# Patient Record
Sex: Female | Born: 1985 | Race: White | Hispanic: No | Marital: Married | Smoking: Current every day smoker
Health system: Southern US, Community
[De-identification: ages and names within clinical notes are randomized; demographics above are authoritative.]

## PROBLEM LIST (undated history)

## (undated) ENCOUNTER — Inpatient Hospital Stay (HOSPITAL_COMMUNITY): Payer: Self-pay

## (undated) DIAGNOSIS — R519 Headache, unspecified: Secondary | ICD-10-CM

## (undated) DIAGNOSIS — Z789 Other specified health status: Secondary | ICD-10-CM

## (undated) DIAGNOSIS — Z331 Pregnant state, incidental: Secondary | ICD-10-CM

## (undated) DIAGNOSIS — R51 Headache: Secondary | ICD-10-CM

## (undated) HISTORY — DX: Other specified health status: Z78.9

## (undated) HISTORY — PX: WISDOM TOOTH EXTRACTION: SHX21

## (undated) HISTORY — PX: ORIF FINGER / THUMB FRACTURE: SUR932

---
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
## 2005-06-30 ENCOUNTER — Emergency Department (HOSPITAL_COMMUNITY): Admission: EM | Admit: 2005-06-30 | Discharge: 2005-06-30 | Payer: Self-pay | Admitting: Emergency Medicine

## 2005-12-16 ENCOUNTER — Emergency Department (HOSPITAL_COMMUNITY): Admission: EM | Admit: 2005-12-16 | Discharge: 2005-12-16 | Payer: Self-pay | Admitting: Emergency Medicine

## 2006-02-24 ENCOUNTER — Emergency Department (HOSPITAL_COMMUNITY): Admission: EM | Admit: 2006-02-24 | Discharge: 2006-02-24 | Payer: Self-pay | Admitting: Emergency Medicine

## 2006-03-10 ENCOUNTER — Emergency Department (HOSPITAL_COMMUNITY): Admission: EM | Admit: 2006-03-10 | Discharge: 2006-03-10 | Payer: Self-pay | Admitting: Emergency Medicine

## 2006-04-04 ENCOUNTER — Inpatient Hospital Stay (HOSPITAL_COMMUNITY): Admission: AD | Admit: 2006-04-04 | Discharge: 2006-04-04 | Payer: Self-pay | Admitting: Gynecology

## 2006-06-07 ENCOUNTER — Inpatient Hospital Stay (HOSPITAL_COMMUNITY): Admission: AD | Admit: 2006-06-07 | Discharge: 2006-06-07 | Payer: Self-pay | Admitting: Obstetrics & Gynecology

## 2006-10-03 ENCOUNTER — Inpatient Hospital Stay (HOSPITAL_COMMUNITY): Admission: AD | Admit: 2006-10-03 | Discharge: 2006-10-03 | Payer: Self-pay | Admitting: Obstetrics and Gynecology

## 2006-10-19 ENCOUNTER — Inpatient Hospital Stay (HOSPITAL_COMMUNITY): Admission: AD | Admit: 2006-10-19 | Discharge: 2006-10-19 | Payer: Self-pay | Admitting: Obstetrics and Gynecology

## 2006-11-10 ENCOUNTER — Inpatient Hospital Stay (HOSPITAL_COMMUNITY): Admission: RE | Admit: 2006-11-10 | Discharge: 2006-11-12 | Payer: Self-pay | Admitting: Obstetrics and Gynecology

## 2006-11-17 ENCOUNTER — Inpatient Hospital Stay (HOSPITAL_COMMUNITY): Admission: AD | Admit: 2006-11-17 | Discharge: 2006-11-18 | Payer: Self-pay | Admitting: Obstetrics and Gynecology

## 2006-12-22 ENCOUNTER — Other Ambulatory Visit: Admission: RE | Admit: 2006-12-22 | Discharge: 2006-12-22 | Payer: Self-pay | Admitting: Obstetrics and Gynecology

## 2007-07-16 ENCOUNTER — Emergency Department (HOSPITAL_COMMUNITY): Admission: EM | Admit: 2007-07-16 | Discharge: 2007-07-16 | Payer: Self-pay | Admitting: Emergency Medicine

## 2007-10-08 ENCOUNTER — Emergency Department (HOSPITAL_COMMUNITY): Admission: EM | Admit: 2007-10-08 | Discharge: 2007-10-08 | Payer: Self-pay | Admitting: Emergency Medicine

## 2007-10-29 ENCOUNTER — Emergency Department (HOSPITAL_COMMUNITY): Admission: EM | Admit: 2007-10-29 | Discharge: 2007-10-29 | Payer: Self-pay | Admitting: Emergency Medicine

## 2008-04-27 ENCOUNTER — Inpatient Hospital Stay (HOSPITAL_COMMUNITY): Admission: AD | Admit: 2008-04-27 | Discharge: 2008-04-27 | Payer: Self-pay | Admitting: Obstetrics

## 2008-06-20 ENCOUNTER — Emergency Department (HOSPITAL_COMMUNITY): Admission: EM | Admit: 2008-06-20 | Discharge: 2008-06-20 | Payer: Self-pay | Admitting: Emergency Medicine

## 2008-11-23 ENCOUNTER — Emergency Department (HOSPITAL_COMMUNITY): Admission: EM | Admit: 2008-11-23 | Discharge: 2008-11-23 | Payer: Self-pay | Admitting: Emergency Medicine

## 2009-03-25 ENCOUNTER — Emergency Department (HOSPITAL_COMMUNITY): Admission: EM | Admit: 2009-03-25 | Discharge: 2009-03-25 | Payer: Self-pay | Admitting: Emergency Medicine

## 2010-07-21 ENCOUNTER — Emergency Department (HOSPITAL_COMMUNITY): Admission: EM | Admit: 2010-07-21 | Discharge: 2010-07-21 | Payer: Self-pay | Admitting: Emergency Medicine

## 2010-12-11 LAB — RAPID STREP SCREEN (MED CTR MEBANE ONLY): Streptococcus, Group A Screen (Direct): NEGATIVE

## 2011-01-06 LAB — URINALYSIS, ROUTINE W REFLEX MICROSCOPIC
Bilirubin Urine: NEGATIVE
Glucose, UA: NEGATIVE mg/dL
Hgb urine dipstick: NEGATIVE
Ketones, ur: NEGATIVE mg/dL
Nitrite: NEGATIVE
Protein, ur: NEGATIVE mg/dL
Specific Gravity, Urine: 1.023 (ref 1.005–1.030)
Urobilinogen, UA: 1 mg/dL (ref 0.0–1.0)
pH: 7 (ref 5.0–8.0)

## 2011-01-06 LAB — PREGNANCY, URINE: Preg Test, Ur: NEGATIVE

## 2011-01-14 LAB — RAPID STREP SCREEN (MED CTR MEBANE ONLY): Streptococcus, Group A Screen (Direct): NEGATIVE

## 2011-02-14 NOTE — H&P (Signed)
Brooke Carrillo, STAILEY                ACCOUNT NO.:  [id number]   MEDICAL RECORD NO.:  [id number]           PATIENT TYPE:   LOCATION:                                FACILITY:  [facility]   PHYSICIAN:  James A. Ashley Royalty, M.D.DATE OF BIRTH:  September 19, 1986   DATE OF ADMISSION:  11/10/2006  DATE OF DISCHARGE:                              HISTORY & PHYSICAL   This is a 25 year old gravida 2, para 0, AB1, EDC November 17, 2006, 39  weeks' gestation.  Prenatal care has been complicated by late transfer  to [facility], smoking, Rh negative (received  RhoGAM), asthma.  The patient has been receiving nonstress tests in the  office in deference to her smoking history and thus far they have been  nonreactive.  Cervical examination on November 09, 2006, revealed the  cervix to be 2-cm dilated, 80% effaced, -2 station, vertex presentation.  The patient is admitted for induction secondary to advanced cervical  changes at term and smoking.   MEDICATIONS:  Vitamins.   PAST MEDICAL HISTORY:   MEDICAL:  Negative.   SURGICAL:  Negative.   ALLERGIES:  NONE.   FAMILY HISTORY:  Noncontributory.   SOCIAL HISTORY:  The patient is a smoker.  She denies significant  alcohol use.   REVIEW OF SYSTEMS:  Noncontributory.   PHYSICAL EXAMINATION:  GENERAL:  Well-developed, well-nourished,  pleasant, white female in no acute distress.  VITAL SIGNS:  Afebrile, vital signs stable, weight 203.  CHEST:  Lungs are clear.  CARDIAC:  Regular rate and rhythm.  ABDOMEN:  Gravid with a term fundal height.  Fetal heart tones are  auscultated.  MUSCULOSKELETAL:  No CVA tenderness.  PELVIC:  Please see most recent office evaluation as above.   IMPRESSION:  1. Intrauterine pregnancy at 36 weeks' gestation.  2. Late transfer to [facility].  3. Smoker.  4. Rh negative.  5. Asthma.  6. Advanced cervical changes at term.   PLAN:  1. Admit.  2. Induction of labor.  Risks,  benefits discussed and accepted.      Questions invited and answered.      James A. Ashley Royalty, M.D.  Electronically Signed     JAM/MEDQ  D:  11/09/2006  T:  11/09/2006  Job:  [id number]

## 2011-02-14 NOTE — Discharge Summary (Signed)
Brooke Carrillo, Brooke Carrillo                ACCOUNT NO.:  [id number]   MEDICAL RECORD NO.:  [id number]          PATIENT TYPE:  INP   LOCATION:  9135                          FACILITY:  [facility]   PHYSICIAN:  James A. Ashley Royalty, M.D.DATE OF BIRTH:  [birth year]   DATE OF ADMISSION:  11/10/2006  DATE OF DISCHARGE:  11/12/2006                               DISCHARGE SUMMARY   DISCHARGE DIAGNOSES:  1. Intrauterine pregnancy at 65 weeks' gestation, delivered.  2. Late transfer for obstetrical care.  3. Smoker.  4. Rh negative.  5. Asthma.   OPERATIONS AND SPECIAL PROCEDURES:  OB delivery with episiotomy,  episiorrhaphy and repair of left vaginal side wall laceration.   CONSULTATIONS:  None.   DISCHARGE MEDICATIONS:  1. Motrin 600 mg.  2. Iron.   HISTORY OF PRESENT ILLNESS:  This is a 25 year old gravida 2, para 0, AB  1 at 62 weeks' gestation. Prenatal care was complicated by the  aforementioned diagnosis. The patient was admitted for induction  secondary to advanced cervical changes at term and history of smoking.  For the remainder of history and physical, please see chart.   HOSPITAL COURSE:  The patient was admitted to [facility]  [place]. Admission laboratory studies were drawn. Rupture of  membranes was accomplished which revealed clear fluid. The patient was  given Pitocin. She went on to labor and deliver on November 10, 2006.  The infant was a 7-pound 6-ounce female, Apgars 8 at one minute and 9 at  five minutes, sent to the newborn nursery. Delivery was accomplished by  Dr. Sylvester Harder over a second-degree midline episiotomy. The patient  had a transient blood pressure elevation after smoking on November 11, 2006. However, the blood pressure elevations were not sustained, and  there was no evidence of preeclampsia found. She was felt to be stable  for discharge on November 12, 2006. She was discharged home afebrile and  in satisfactory condition.   DISPOSITION:  The  patient is to return to [facility] and  Obstetrics in 6 weeks for postpartum evaluation.     James A. Ashley Royalty, M.D.  Electronically Signed    JAM/MEDQ  D:  12/30/2006  T:  12/30/2006  Job:  [id number]

## 2011-06-07 ENCOUNTER — Emergency Department (HOSPITAL_COMMUNITY)
Admission: EM | Admit: 2011-06-07 | Discharge: 2011-06-07 | Disposition: A | Discharge status: Home / Self Care | Payer: Medicaid Other | Attending: Emergency Medicine | Admitting: Emergency Medicine

## 2011-06-07 DIAGNOSIS — J029 Acute pharyngitis, unspecified: Secondary | ICD-10-CM | POA: Insufficient documentation

## 2011-06-07 DIAGNOSIS — R6889 Other general symptoms and signs: Secondary | ICD-10-CM | POA: Insufficient documentation

## 2011-06-07 DIAGNOSIS — R05 Cough: Secondary | ICD-10-CM | POA: Insufficient documentation

## 2011-06-07 DIAGNOSIS — R059 Cough, unspecified: Secondary | ICD-10-CM | POA: Insufficient documentation

## 2011-06-07 LAB — RAPID STREP SCREEN (MED CTR MEBANE ONLY): Streptococcus, Group A Screen (Direct): NEGATIVE

## 2011-06-18 LAB — DIFFERENTIAL
Basophils Absolute: 0.1
Basophils Relative: 1
Eosinophils Absolute: 0.3
Eosinophils Relative: 2
Lymphocytes Relative: 22
Lymphs Abs: 3.2
Monocytes Absolute: 0.6
Monocytes Relative: 4
Neutro Abs: 10.5 — ABNORMAL HIGH
Neutrophils Relative %: 71

## 2011-06-18 LAB — COMPREHENSIVE METABOLIC PANEL
ALT: 15
AST: 20
Albumin: 4.6
Alkaline Phosphatase: 70
BUN: 6
CO2: 28
Calcium: 10.2
Chloride: 105
Creatinine, Ser: 0.76
GFR calc Af Amer: >60
GFR calc non Af Amer: >60
Glucose, Bld: 94
Potassium: 4
Sodium: 140
Total Bilirubin: 1.1
Total Protein: 7.7

## 2011-06-18 LAB — CBC
HCT: 42.6
Hemoglobin: 14.8
MCHC: 34.8
MCV: 92.4
Platelets: 350
RBC: 4.61
RDW: 13.5
WBC: 14.8 — ABNORMAL HIGH

## 2011-06-18 LAB — APTT: aPTT: 29

## 2011-06-18 LAB — PROTIME-INR
INR: 1
Prothrombin Time: 13.3

## 2011-06-27 LAB — URINALYSIS, ROUTINE W REFLEX MICROSCOPIC
Bilirubin Urine: NEGATIVE
Glucose, UA: NEGATIVE
Hgb urine dipstick: NEGATIVE
Ketones, ur: NEGATIVE
Nitrite: NEGATIVE
Protein, ur: NEGATIVE
Specific Gravity, Urine: 1.015
Urobilinogen, UA: 2 — ABNORMAL HIGH
pH: 7.5

## 2011-06-27 LAB — HCG, QUANTITATIVE, PREGNANCY: hCG, Beta Chain, Quant, S: 32094 — ABNORMAL HIGH

## 2011-06-30 LAB — RAPID STREP SCREEN (MED CTR MEBANE ONLY): Streptococcus, Group A Screen (Direct): NEGATIVE

## 2011-06-30 LAB — MONONUCLEOSIS SCREEN: Mono Screen: NEGATIVE

## 2011-07-09 LAB — URINALYSIS, ROUTINE W REFLEX MICROSCOPIC
Bilirubin Urine: NEGATIVE
Glucose, UA: NEGATIVE
Hgb urine dipstick: NEGATIVE
Ketones, ur: NEGATIVE
Nitrite: NEGATIVE
Protein, ur: NEGATIVE
Specific Gravity, Urine: 1.02
Urobilinogen, UA: 0.2
pH: 6.5

## 2011-07-09 LAB — PREGNANCY, URINE: Preg Test, Ur: NEGATIVE

## 2011-09-02 LAB — OB RESULTS CONSOLE RPR: RPR: NONREACTIVE

## 2011-09-02 LAB — OB RESULTS CONSOLE HEPATITIS B SURFACE ANTIGEN: Hepatitis B Surface Ag: NEGATIVE

## 2011-09-02 LAB — OB RESULTS CONSOLE HIV ANTIBODY (ROUTINE TESTING): HIV: NONREACTIVE

## 2011-09-02 LAB — OB RESULTS CONSOLE RUBELLA ANTIBODY, IGM: Rubella: IMMUNE

## 2011-09-08 LAB — OB RESULTS CONSOLE GC/CHLAMYDIA
Chlamydia: NEGATIVE
Gonorrhea: NEGATIVE

## 2011-09-30 NOTE — L&D Delivery Note (Signed)
Delivery Note At 12:16 PM a viable female was delivered via Vaginal, Spontaneous Delivery (Presentation: Left Occiput Anterior).  APGAR: 9, 9; weight  3570 gms.   Placenta status: Intact, Spontaneous.  Cord: 3 vessels with the following complications: None.  Cord pH: none  Anesthesia: Epidural  Episiotomy: None Lacerations: None Suture Repair: none Est. Blood Loss (mL): 200  Mom to postpartum.  Baby to nursery-stable.  Jaydan Chretien A 02/24/2012, 12:49 PM

## 2011-10-01 ENCOUNTER — Inpatient Hospital Stay (HOSPITAL_COMMUNITY)
Admission: AD | Admit: 2011-10-01 | Discharge: 2011-10-01 | Disposition: A | Discharge status: Home / Self Care | Payer: Medicaid Other | Source: Ambulatory Visit | Attending: Obstetrics & Gynecology | Admitting: Obstetrics & Gynecology

## 2011-10-01 ENCOUNTER — Encounter (HOSPITAL_COMMUNITY): Payer: Self-pay | Admitting: *Deleted

## 2011-10-01 DIAGNOSIS — R109 Unspecified abdominal pain: Secondary | ICD-10-CM | POA: Insufficient documentation

## 2011-10-01 DIAGNOSIS — M545 Low back pain, unspecified: Secondary | ICD-10-CM | POA: Insufficient documentation

## 2011-10-01 DIAGNOSIS — N949 Unspecified condition associated with female genital organs and menstrual cycle: Secondary | ICD-10-CM

## 2011-10-01 HISTORY — DX: Other specified health status: Z78.9

## 2011-10-01 LAB — URINALYSIS, ROUTINE W REFLEX MICROSCOPIC
Bilirubin Urine: NEGATIVE
Glucose, UA: NEGATIVE mg/dL
Hgb urine dipstick: NEGATIVE
Ketones, ur: NEGATIVE mg/dL
Leukocytes, UA: NEGATIVE
Nitrite: NEGATIVE
Protein, ur: NEGATIVE mg/dL
Specific Gravity, Urine: 1.02 (ref 1.005–1.030)
Urobilinogen, UA: 0.2 mg/dL (ref 0.0–1.0)
pH: 6 (ref 5.0–8.0)

## 2011-10-01 NOTE — Progress Notes (Signed)
FHT's via external monitor.

## 2011-10-01 NOTE — Progress Notes (Signed)
E. Rice, PA at bedside.  Assessment done and poc discussed with pt.   

## 2011-10-01 NOTE — Progress Notes (Signed)
Pt presents to [facility] for c/o lower abdominal pain that started this morning.  Got worse about an hour.  Denies bleeding or leaking.

## 2011-10-01 NOTE — ED Provider Notes (Signed)
History   Pt presents today c/o lower abd and lower back pain for the past couple of days. She states the pains are worse with movement. She denies vag bleeding or any other sx at this time.  Chief Complaint  Patient presents with  . Abdominal Pain   HPI  OB History    Grav Para Term Preterm Abortions TAB SAB Ect Mult Living   1               No past medical history on file.  No past surgical history on file.  No family history on file.  History  Substance Use Topics  . Smoking status: Not on file  . Smokeless tobacco: Not on file  . Alcohol Use: Not on file    Allergies: Allergies not on file  No prescriptions prior to admission    Review of Systems  Constitutional: Negative for fever.  Cardiovascular: Negative for chest pain.  Gastrointestinal: Positive for abdominal pain. Negative for nausea, vomiting, diarrhea and constipation.  Genitourinary: Negative for dysuria, urgency, frequency and hematuria.  Neurological: Negative for dizziness, tingling and headaches.  Psychiatric/Behavioral: Negative for depression and suicidal ideas.   Physical Exam   Blood pressure 124/51, pulse 90, temperature 98.1 F (36.7 C), temperature source Oral, resp. rate 20, height 5\' 2"  (1.575 m), weight 173 lb (78.472 kg), SpO2 98.00%.  Physical Exam  Nursing note and vitals reviewed. Constitutional: She is oriented to person, place, and time. She appears well-developed and well-nourished. No distress.  HENT:  Head: Normocephalic and atraumatic.  Eyes: EOM are normal. Pupils are equal, round, and reactive to light.  GI: Soft. She exhibits no distension. There is no tenderness. There is no rebound and no guarding.  Genitourinary: No bleeding around the vagina. No vaginal discharge found.       Cervix Lg/closed. No dc or bleeding noted on exam.  Neurological: She is alert and oriented to person, place, and time.  Skin: Skin is warm and dry. She is not diaphoretic.  Psychiatric: She  has a normal mood and affect. Her behavior is normal. Judgment and thought content normal.    MAU Course  Procedures  Results for orders placed during the hospital encounter of 10/01/11 (from the past 24 hour(s))  URINALYSIS, ROUTINE W REFLEX MICROSCOPIC     Status: Normal   Collection Time   10/01/11 11:00 PM      Component Value Range   Color, Urine YELLOW  YELLOW    APPearance CLEAR  CLEAR    Specific Gravity, Urine 1.020  1.005 - 1.030    pH 6.0  5.0 - 8.0    Glucose, UA NEGATIVE  NEGATIVE (mg/dL)   Hgb urine dipstick NEGATIVE  NEGATIVE    Bilirubin Urine NEGATIVE  NEGATIVE    Ketones, ur NEGATIVE  NEGATIVE (mg/dL)   Protein, ur NEGATIVE  NEGATIVE (mg/dL)   Urobilinogen, UA 0.2  0.0 - 1.0 (mg/dL)   Nitrite NEGATIVE  NEGATIVE    Leukocytes, UA NEGATIVE  NEGATIVE      Assessment and Plan  Round ligament pain: discussed with pt at length. She has f/u scheduled with Dr. Tamela Oddi. Discussed diet, activity, risks, and precautions.  Clinton Gallant. Girtie Wiersma III, DrHSc, MPAS, PA-C  10/01/2011, 11:12 PM   Henrietta Hoover, PA 10/01/11 2318

## 2011-12-03 ENCOUNTER — Inpatient Hospital Stay (HOSPITAL_COMMUNITY)
Admission: AD | Admit: 2011-12-03 | Discharge: 2011-12-03 | Disposition: A | Discharge status: Home / Self Care | Payer: Medicaid Other | Source: Ambulatory Visit | Attending: Obstetrics & Gynecology | Admitting: Obstetrics & Gynecology

## 2011-12-03 DIAGNOSIS — Z2989 Encounter for other specified prophylactic measures: Secondary | ICD-10-CM | POA: Insufficient documentation

## 2011-12-03 DIAGNOSIS — Z298 Encounter for other specified prophylactic measures: Secondary | ICD-10-CM | POA: Insufficient documentation

## 2011-12-03 MED ORDER — RHO D IMMUNE GLOBULIN 1500 UNIT/2ML IJ SOLN
300.0000 ug | Freq: Once | INTRAMUSCULAR | Status: AC
  Administered 2011-12-03: 300 ug via INTRAMUSCULAR
  Filled 2011-12-03: qty 2

## 2011-12-03 NOTE — Plan of Care (Signed)
Rhophylac information sheet given to the patient and her husband. Explained the process of 1 1/2 hours to process and administer this medication. Patient and her husband understand. Patient will leave after the blood is drawn to take her husband to work and return. Explained that she needs to leave the bracelet on until her return.

## 2011-12-04 LAB — RH IG WORKUP (INCLUDES ABO/RH)
ABO/RH(D): O NEG
Antibody Screen: NEGATIVE
Gestational Age(Wks): 28
Unit division: 0

## 2011-12-18 ENCOUNTER — Encounter (HOSPITAL_COMMUNITY): Payer: Self-pay | Admitting: Emergency Medicine

## 2011-12-18 ENCOUNTER — Emergency Department (HOSPITAL_COMMUNITY)
Admission: EM | Admit: 2011-12-18 | Discharge: 2011-12-18 | Disposition: A | Discharge status: Home / Self Care | Payer: Medicaid Other | Attending: Emergency Medicine | Admitting: Emergency Medicine

## 2011-12-18 ENCOUNTER — Other Ambulatory Visit: Payer: Self-pay

## 2011-12-18 DIAGNOSIS — O9989 Other specified diseases and conditions complicating pregnancy, childbirth and the puerperium: Secondary | ICD-10-CM | POA: Insufficient documentation

## 2011-12-18 DIAGNOSIS — K219 Gastro-esophageal reflux disease without esophagitis: Secondary | ICD-10-CM | POA: Insufficient documentation

## 2011-12-18 DIAGNOSIS — R209 Unspecified disturbances of skin sensation: Secondary | ICD-10-CM | POA: Insufficient documentation

## 2011-12-18 DIAGNOSIS — R110 Nausea: Secondary | ICD-10-CM | POA: Insufficient documentation

## 2011-12-18 DIAGNOSIS — R079 Chest pain, unspecified: Secondary | ICD-10-CM | POA: Insufficient documentation

## 2011-12-18 DIAGNOSIS — R0609 Other forms of dyspnea: Secondary | ICD-10-CM | POA: Insufficient documentation

## 2011-12-18 DIAGNOSIS — R1013 Epigastric pain: Secondary | ICD-10-CM | POA: Insufficient documentation

## 2011-12-18 DIAGNOSIS — R0989 Other specified symptoms and signs involving the circulatory and respiratory systems: Secondary | ICD-10-CM | POA: Insufficient documentation

## 2011-12-18 HISTORY — DX: Pregnant state, incidental: Z33.1

## 2011-12-18 LAB — URINALYSIS, ROUTINE W REFLEX MICROSCOPIC
Glucose, UA: NEGATIVE mg/dL
Hgb urine dipstick: NEGATIVE
Ketones, ur: 15 mg/dL — AB
Nitrite: NEGATIVE
Protein, ur: 30 mg/dL — AB
Specific Gravity, Urine: 1.024 (ref 1.005–1.030)
Urobilinogen, UA: 1 mg/dL (ref 0.0–1.0)
pH: 7 (ref 5.0–8.0)

## 2011-12-18 LAB — COMPREHENSIVE METABOLIC PANEL
ALT: 5 U/L (ref 0–35)
AST: 11 U/L (ref 0–37)
Albumin: 2.6 g/dL — ABNORMAL LOW (ref 3.5–5.2)
Alkaline Phosphatase: 100 U/L (ref 39–117)
BUN: 6 mg/dL (ref 6–23)
CO2: 24 mEq/L (ref 19–32)
Calcium: 9.8 mg/dL (ref 8.4–10.5)
Chloride: 105 mEq/L (ref 96–112)
Creatinine, Ser: 0.63 mg/dL (ref 0.50–1.10)
GFR calc Af Amer: >90 mL/min (ref >90)
GFR calc non Af Amer: >90 mL/min (ref >90)
Glucose, Bld: 81 mg/dL (ref 70–99)
Potassium: 4.4 mEq/L (ref 3.5–5.1)
Sodium: 138 mEq/L (ref 135–145)
Total Bilirubin: 0.2 mg/dL — ABNORMAL LOW (ref 0.3–1.2)
Total Protein: 6.1 g/dL (ref 6.0–8.3)

## 2011-12-18 LAB — CBC
HCT: 30.7 % — ABNORMAL LOW (ref 36.0–46.0)
Hemoglobin: 10.6 g/dL — ABNORMAL LOW (ref 12.0–15.0)
MCH: 31.7 pg (ref 26.0–34.0)
MCHC: 34.5 g/dL (ref 30.0–36.0)
MCV: 91.9 fL (ref 78.0–100.0)
Platelets: 343 10*3/uL (ref 150–400)
RBC: 3.34 MIL/uL — ABNORMAL LOW (ref 3.87–5.11)
RDW: 12.6 % (ref 11.5–15.5)
WBC: 20.1 10*3/uL — ABNORMAL HIGH (ref 4.0–10.5)

## 2011-12-18 LAB — DIFFERENTIAL
Basophils Absolute: 0.1 10*3/uL (ref 0.0–0.1)
Basophils Relative: 0 % (ref 0–1)
Eosinophils Absolute: 0.2 10*3/uL (ref 0.0–0.7)
Eosinophils Relative: 1 % (ref 0–5)
Lymphocytes Relative: 16 % (ref 12–46)
Lymphs Abs: 3.2 10*3/uL (ref 0.7–4.0)
Monocytes Absolute: 1.3 10*3/uL — ABNORMAL HIGH (ref 0.1–1.0)
Monocytes Relative: 6 % (ref 3–12)
Neutro Abs: 15.4 10*3/uL — ABNORMAL HIGH (ref 1.7–7.7)
Neutrophils Relative %: 77 % (ref 43–77)

## 2011-12-18 LAB — URINE MICROSCOPIC-ADD ON

## 2011-12-18 LAB — TROPONIN I: Troponin I: <0.3 ng/mL (ref <0.30)

## 2011-12-18 MED ORDER — RANITIDINE HCL 150 MG PO TABS: 150.0000 mg | ORAL_TABLET | Freq: Two times a day (BID) | ORAL | Status: AC

## 2011-12-18 MED ORDER — ACETAMINOPHEN 325 MG PO TABS
650.0000 mg | ORAL_TABLET | Freq: Once | ORAL | Status: AC
  Administered 2011-12-18: 650 mg via ORAL
  Filled 2011-12-18: qty 2

## 2011-12-18 MED ORDER — CALCIUM CARBONATE ANTACID 500 MG PO CHEW
400.0000 mg | CHEWABLE_TABLET | Freq: Once | ORAL | Status: AC
  Administered 2011-12-18: 400 mg via ORAL
  Filled 2011-12-18: qty 2

## 2011-12-18 NOTE — Progress Notes (Signed)
Spoke with Dr. Clearance Coots Notified of pts reactive NST. Ok to remove pt from Fetal monitoring

## 2011-12-18 NOTE — ED Notes (Signed)
30 week ob pt edc is may 26  Having cp since this am . Some nausea  No abd pain or back pain rapid response ob nurse called

## 2011-12-18 NOTE — ED Provider Notes (Signed)
History     CSN: [id number]  Arrival date & time 12/18/11  1319   First MD Initiated Contact with Patient 12/18/11 1408      Chief Complaint  Patient presents with  . Chest Pain    (Consider location/radiation/quality/duration/timing/severity/associated sxs/prior treatment) HPI The patient is a G3P1011, approx 7 months pregnant, with no significant PMH, presenting with chest pain.  The patient was awoken from sleep at 7am today with a mid-sternal burning sensation, with no radiation, associated with mild nausea and dyspnea, which has remained constant since that time, not worsened by exertion, unchanged by food, not reproducible to palpation, with no aggravating/alleviating factors.  She notes a history of occasional heartburn, but notes that this is significantly worse.  She does note eating some chips and spicy salsa about an hour before bed yesterday.  She has no history of cardiac or pulmonary disease.  She notes no fevers, chills, vomiting, diarrhea/constipation, palpitations, or headache.  The patient does note that for the last 20 minutes, she has felt occasional transient feelings of numbness and tingling in her arms bilaterally, from elbow to fingertips, that fully resolve, then recur.  The patient's due date is May 26th.   Past Medical History  Diagnosis Date  . No pertinent past medical history   . Pregnancy as incidental finding     Past Surgical History  Procedure Date  . Wisdom tooth extraction     No family history on file. No family history of prior MI  History  Substance Use Topics  . Smoking status: Current Everyday Smoker -- 0.2 packs/day  . Smokeless tobacco: Not on file  . Alcohol Use: No    OB History    Grav Para Term Preterm Abortions TAB SAB Ect Mult Living   4 1 1  0 1 0 1 0 0 1      Review of Systems General: no fevers, chills, changes in weight, changes in appetite Skin: no rash HEENT: no blurry vision, hearing changes, sore throat Pulm: no  coughing, wheezing CV: see HPI Abd: no abdominal pain, nausea/vomiting, diarrhea/constipation GU: no dysuria, hematuria, polyuria Ext: no arthralgias, myalgias Neuro: see HPI  Allergies  Review of patient's allergies indicates no known allergies.  Home Medications   Current Outpatient Rx  Name Route Sig Dispense Refill  . PRENATAL MULTIVITAMIN CH Oral Take 1 tablet by mouth daily.        BP 106/65  Pulse 116  Temp(Src) 97.9 F (36.6 C) (Oral)  Resp 18  SpO2 97%  Physical Exam General: alert, cooperative, appears anxious HEENT: pupils equal round and reactive to light, vision grossly intact, oropharynx clear and non-erythematous  Neck: supple, no lymphadenopathy Lungs: clear to ascultation bilaterally, normal work of respiration, no wheezes, rales, ronchi Heart: regular rate and rhythm, no murmurs, gallops, or rubs Abdomen: soft, mildly tender to deep epigastric palpation, non-distended, normal bowel sounds Msk: no joint edema, warmth, or erythema Extremities: non-pitting edema bilaterally Neurologic: alert & oriented X3, cranial nerves II-XII intact, strength 5/5 throughout bilateral UE, sensation intake to light touch and pinprick throughout bilateral UE  ED Course  Procedures (including critical care time)   Labs Reviewed  CBC  DIFFERENTIAL  TROPONIN I  COMPREHENSIVE METABOLIC PANEL  URINALYSIS, ROUTINE W REFLEX MICROSCOPIC   No results found.   No diagnosis found.  EKG: Tachycardia, sinus rhythm, no ST segment changes  MDM  The patient is a 26 yo G3P1011, who woke up with mid-sternal burning chest pain, after eating spicy  food late at night, likely representing GERD.  # Chest pain - the patient has a TIMI score of 0, and only smoking as a risk factor for CAD.  EKG showed tachycardia, but was otherwise unremarkable.  Given the intake of spicy food and the patient's 3rd-trimester pregnancy, her symptoms likely represent heartburn.  Very unlikely ACS.  Also  consider msk. -checking cbc, cmp, troponin -will avoid cxr given pregnancy -will give tums and tylenol  # Arm tingling - no deficit seen on full UE neuro exam.  Symptoms likely represent anxiety vs positional msk discomfort on hospital bed.  As symptoms started in ED 20 minutes ago and resolve completely, etiology is likely benign.  # Dispo - awaiting full labs; if negative and patient experiences improvement with Tums, will likely d/c home.   Linward Headland, MD 12/18/11 [phone]

## 2011-12-18 NOTE — Discharge Instructions (Signed)

## 2011-12-21 NOTE — ED Provider Notes (Signed)
I saw and evaluated the patient, reviewed the resident's note and I agree with the findings and plan.   .Face to face Exam:  General:  Awake HEENT:  Atraumatic Resp:  Normal effort Abd:  Nondistended Neuro:No focal weakness   Nelia Shi, MD 12/21/11 1007

## 2012-01-22 ENCOUNTER — Encounter (HOSPITAL_COMMUNITY): Payer: Self-pay | Admitting: *Deleted

## 2012-01-22 ENCOUNTER — Inpatient Hospital Stay (HOSPITAL_COMMUNITY)
Admission: AD | Admit: 2012-01-22 | Discharge: 2012-01-22 | Disposition: A | Discharge status: Home / Self Care | Payer: Medicaid Other | Source: Ambulatory Visit | Attending: Obstetrics & Gynecology | Admitting: Obstetrics & Gynecology

## 2012-01-22 DIAGNOSIS — M545 Low back pain, unspecified: Secondary | ICD-10-CM

## 2012-01-22 DIAGNOSIS — M5380 Other specified dorsopathies, site unspecified: Secondary | ICD-10-CM | POA: Insufficient documentation

## 2012-01-22 DIAGNOSIS — O479 False labor, unspecified: Secondary | ICD-10-CM

## 2012-01-22 DIAGNOSIS — M62830 Muscle spasm of back: Secondary | ICD-10-CM

## 2012-01-22 DIAGNOSIS — O4700 False labor before 37 completed weeks of gestation, unspecified trimester: Secondary | ICD-10-CM | POA: Insufficient documentation

## 2012-01-22 DIAGNOSIS — O99891 Other specified diseases and conditions complicating pregnancy: Secondary | ICD-10-CM | POA: Insufficient documentation

## 2012-01-22 LAB — URINALYSIS, ROUTINE W REFLEX MICROSCOPIC
Bilirubin Urine: NEGATIVE
Glucose, UA: NEGATIVE mg/dL
Hgb urine dipstick: NEGATIVE
Ketones, ur: NEGATIVE mg/dL
Leukocytes, UA: NEGATIVE
Nitrite: NEGATIVE
Protein, ur: NEGATIVE mg/dL
Specific Gravity, Urine: <1.005 — ABNORMAL LOW (ref 1.005–1.030)
Urobilinogen, UA: 0.2 mg/dL (ref 0.0–1.0)
pH: 6 (ref 5.0–8.0)

## 2012-01-22 MED ORDER — CYCLOBENZAPRINE HCL 10 MG PO TABS: 10.0000 mg | ORAL_TABLET | Freq: Two times a day (BID) | ORAL | Status: DC | PRN

## 2012-01-22 NOTE — MAU Note (Signed)
Pt reports having lower back pain and pressure. Reports having diarrhea and headaches x2 days.

## 2012-01-22 NOTE — MAU Provider Note (Signed)
History     CSN: [id number]  Arrival date and time: 01/22/12 1538   First Provider Initiated Contact with Patient 01/22/12 1840      Chief Complaint  Patient presents with  . Back Pain   HPI This is a 26 year old G4 P1011 at 35 weeks and 4 days who seen the MAU with one week of back pain it has become worse over the past 3 or 4 days. Pain is worse with sitting for long periods of time and going from a sitting to a standing position. There is no radiation to her pain. She describes the pain as constant spasms. She denies fevers, chills, nausea, vomiting, contractions, leaking fluid. She reports good fetal activity.  OB History    Grav Para Term Preterm Abortions TAB SAB Ect Mult Living   4 1 1  0 1 0 1 0 0 1      Past Medical History  Diagnosis Date  . No pertinent past medical history   . Pregnancy as incidental finding     Past Surgical History  Procedure Date  . Wisdom tooth extraction   . Orif finger / thumb fracture     No family history on file.  History  Substance Use Topics  . Smoking status: Current Everyday Smoker -- 0.2 packs/day  . Smokeless tobacco: Not on file  . Alcohol Use: No    Allergies: No Known Allergies  Prescriptions prior to admission  Medication Sig Dispense Refill  . acetaminophen (TYLENOL) 500 MG tablet Take 500 mg by mouth every 6 (six) hours as needed. Head ache pain      . Prenatal Vit-Fe Fumarate-FA (PRENATAL MULTIVITAMIN) TABS Take 1 tablet by mouth daily.        . ranitidine (ZANTAC) 150 MG tablet Take 1 tablet (150 mg total) by mouth 2 (two) times daily.  30 tablet  1    Review of Systems  Constitutional: Negative for fever and chills.  Gastrointestinal: Positive for diarrhea (a couple episodes). Negative for nausea, vomiting and constipation.  Neurological: Positive for headaches (mild). Negative for weakness.   Physical Exam   Blood pressure 122/70, pulse 110, temperature 97 F (36.1 C), temperature source Oral, resp. rate  18, height 5\' 2"  (1.575 m), weight 89.359 kg (197 lb).  Physical Exam  Constitutional: She is oriented to person, place, and time. She appears well-developed and well-nourished.  GI: Soft. Bowel sounds are normal. She exhibits no distension and no mass. There is no tenderness. There is no rebound and no guarding.  Musculoskeletal: Normal range of motion. She exhibits no edema.       Quadratus lumborum muscle spasms.  Neurological: She is alert and oriented to person, place, and time.  Skin: Skin is warm and dry.  Psychiatric: She has a normal mood and affect. Her behavior is normal. Judgment and thought content normal.   Dilation: Fingertip Effacement (%): Thick Cervical Position: Posterior Station: -3 Presentation: Vertex Exam by:: Dr. Adrian Blackwater   Results for orders placed during the hospital encounter of 01/22/12 (from the past 24 hour(s))  URINALYSIS, ROUTINE W REFLEX MICROSCOPIC     Status: Abnormal   Collection Time   01/22/12  4:00 PM      Component Value Range   Color, Urine YELLOW  YELLOW    APPearance CLEAR  CLEAR    Specific Gravity, Urine <1.005 (*) 1.005 - 1.030    pH 6.0  5.0 - 8.0    Glucose, UA NEGATIVE  NEGATIVE (mg/dL)  Hgb urine dipstick NEGATIVE  NEGATIVE    Bilirubin Urine NEGATIVE  NEGATIVE    Ketones, ur NEGATIVE  NEGATIVE (mg/dL)   Protein, ur NEGATIVE  NEGATIVE (mg/dL)   Urobilinogen, UA 0.2  0.0 - 1.0 (mg/dL)   Nitrite NEGATIVE  NEGATIVE    Leukocytes, UA NEGATIVE  NEGATIVE      MAU Course  Procedures NST: Category 1 tracing with baseline rate of 140s with multiple accelerations.  MDM Minimal contractions. Cervix is a fingertip dilated. I do not believe that this represents labor. She does not have a urinary tract infection that would be contributing to the pelvic pressure or back pain. Rather, this represents muscle to back pain.  Assessment and Plan  1.  IUP at 35.4 weeks 2.  Muscle back spasm  With the patient prescription for Flexeril.  Discussed use of heat and/or ice. Patient to followup with primary obstetrician next Tuesday, sooner and it back pain worsens.  Ethelene Closser JEHIEL 01/22/2012, 6:59 PM

## 2012-01-22 NOTE — Discharge Instructions (Signed)

## 2012-02-02 ENCOUNTER — Inpatient Hospital Stay (HOSPITAL_COMMUNITY)
Admission: AD | Admit: 2012-02-02 | Discharge: 2012-02-02 | Disposition: A | Discharge status: Home / Self Care | Payer: Medicaid Other | Source: Ambulatory Visit | Attending: Obstetrics | Admitting: Obstetrics

## 2012-02-02 ENCOUNTER — Encounter (HOSPITAL_COMMUNITY): Payer: Self-pay | Admitting: *Deleted

## 2012-02-02 DIAGNOSIS — R42 Dizziness and giddiness: Secondary | ICD-10-CM | POA: Insufficient documentation

## 2012-02-02 DIAGNOSIS — O99891 Other specified diseases and conditions complicating pregnancy: Secondary | ICD-10-CM | POA: Insufficient documentation

## 2012-02-02 LAB — URINALYSIS, ROUTINE W REFLEX MICROSCOPIC
Bilirubin Urine: NEGATIVE
Glucose, UA: NEGATIVE mg/dL
Hgb urine dipstick: NEGATIVE
Ketones, ur: NEGATIVE mg/dL
Leukocytes, UA: NEGATIVE
Nitrite: NEGATIVE
Protein, ur: NEGATIVE mg/dL
Specific Gravity, Urine: <1.005 — ABNORMAL LOW (ref 1.005–1.030)
Urobilinogen, UA: 0.2 mg/dL (ref 0.0–1.0)
pH: 6.5 (ref 5.0–8.0)

## 2012-02-02 LAB — RAPID URINE DRUG SCREEN, HOSP PERFORMED
Amphetamines: NOT DETECTED
Barbiturates: NOT DETECTED
Benzodiazepines: NOT DETECTED
Cocaine: NOT DETECTED
Opiates: NOT DETECTED
Tetrahydrocannabinol: NOT DETECTED

## 2012-02-02 NOTE — MAU Provider Note (Signed)
History     CSN: [id number]  Arrival date and time: 02/02/12 4098   First Provider Initiated Contact with Patient 02/02/12 0111      No chief complaint on file.  HPI This is a 26 y.o. female at [redacted]w[redacted]d who presents with c/o dizziness and blurred vision today. Also has had nausea today. Does not change with standing up or lying down. No leaking, bleeding or contractions. Denies problems with sinus congestion.   OB History    Grav Para Term Preterm Abortions TAB SAB Ect Mult Living   4 1 1  0 2 1 1  0 0 1      Past Medical History  Diagnosis Date  . No pertinent past medical history   . Pregnancy as incidental finding     Past Surgical History  Procedure Date  . Wisdom tooth extraction   . Orif finger / thumb fracture     Family History  Problem Relation Age of Onset  . Hypertension Father     History  Substance Use Topics  . Smoking status: Current Everyday Smoker -- 0.2 packs/day  . Smokeless tobacco: Not on file  . Alcohol Use: No    Allergies: No Known Allergies  Prescriptions prior to admission  Medication Sig Dispense Refill  . acetaminophen (TYLENOL) 500 MG tablet Take 500 mg by mouth every 6 (six) hours as needed. Head ache pain      . cyclobenzaprine (FLEXERIL) 10 MG tablet Take 10 mg by mouth 3 (three) times daily as needed. For muscle spasm      . Prenatal Vit-Fe Fumarate-FA (PRENATAL MULTIVITAMIN) TABS Take 1 tablet by mouth daily.        . ranitidine (ZANTAC) 150 MG tablet Take 1 tablet (150 mg total) by mouth 2 (two) times daily.  30 tablet  1  . zolpidem (AMBIEN) 10 MG tablet Take 10 mg by mouth at bedtime as needed. For sleep        Review of Systems  Constitutional: Negative for fever and chills.  HENT: Negative for congestion.   Eyes: Positive for blurred vision.  Neurological: Positive for dizziness. Negative for headaches.    Physical Exam   Blood pressure 111/71, pulse 125, temperature 97 F (36.1 C), temperature source Oral, resp. rate  18, height 5\' 3"  (1.6 m), weight 200 lb 2 oz (90.776 kg).  Physical Exam  Constitutional: She is oriented to person, place, and time. She appears well-developed and well-nourished. No distress.  HENT:  Head: Normocephalic.  Cardiovascular: Normal rate, regular rhythm and normal heart sounds.   Respiratory: Effort normal and breath sounds normal. No respiratory distress. She has no wheezes. She has no rales.  GI: Soft. She exhibits no distension. There is no tenderness. There is no rebound and no guarding.  Genitourinary:       FHR reactive No contractions  Musculoskeletal: Normal range of motion.  Neurological: She is alert and oriented to person, place, and time. Coordination normal.  Skin: Skin is warm and dry.  Psychiatric: She has a normal mood and affect.   Filed Vitals:   02/02/12 0056 02/02/12 0121 02/02/12 0123 02/02/12 0124  BP: 111/71 121/70 121/67 113/60  Pulse: 125 82 89 111  Temp:      TempSrc:      Resp:      Height:      Weight:         MAU Course  Procedures  MDM Discussed with patient that the HR went up with standing  position, indicating possible orthostasis.  However, her urine shows good hydration. Possibly, could represent prodromal stomach flu, since nausea accompanies dizziness.  Pt agrees to treat conservatively at home and come back if she feels worse. Has appt Tuesday.   Assessment and Plan  A:  SIUP at [redacted]w[redacted]d      Vertigo      Possible orthostasis  P:  Discharge      Hydration at home       Come back if feels worse      Keep appt for Tuesday.  Willye Javier 02/02/2012, 1:21 AM

## 2012-02-02 NOTE — Progress Notes (Signed)
Pt up to bathroom at this time

## 2012-02-02 NOTE — Discharge Instructions (Signed)

## 2012-02-02 NOTE — MAU Note (Signed)
PT SAYS SHE HAS HAD PRESSURE  X2 WEEKS.  WAS HERE 3 WEEKS VE- 1/2 CM.     DIZZY SPELLS  AND BLURRED VISION  STARTED    Friday.  SHE WAS WALKING  IN STORE-  HAS CONTINUED THROUGH WEEKEND.Marland Kitchen  SHE DID NOT CALL DR.

## 2012-02-07 ENCOUNTER — Inpatient Hospital Stay (HOSPITAL_COMMUNITY)
Admission: AD | Admit: 2012-02-07 | Discharge: 2012-02-07 | Disposition: A | Discharge status: Home / Self Care | Payer: Medicaid Other | Source: Ambulatory Visit | Attending: Obstetrics & Gynecology | Admitting: Obstetrics & Gynecology

## 2012-02-07 DIAGNOSIS — O99891 Other specified diseases and conditions complicating pregnancy: Secondary | ICD-10-CM | POA: Insufficient documentation

## 2012-02-07 DIAGNOSIS — O4700 False labor before 37 completed weeks of gestation, unspecified trimester: Secondary | ICD-10-CM

## 2012-02-07 DIAGNOSIS — O479 False labor, unspecified: Secondary | ICD-10-CM | POA: Insufficient documentation

## 2012-02-07 NOTE — Discharge Instructions (Signed)
Fetal Movement Counts Patient Name: __________________________________________________ Patient Due Date: ____________________ Kick counts is highly recommended in high risk pregnancies, but it is a good idea for every pregnant woman to do. Start counting fetal movements at 28 weeks of the pregnancy. Fetal movements increase after eating a full meal or eating or drinking something sweet (the blood sugar is higher). It is also important to drink plenty of fluids (well hydrated) before doing the count. Lie on your left side because it helps with the circulation or you can sit in a comfortable chair with your arms over your belly (abdomen) with no distractions around you. DOING THE COUNT  Try to do the count the same time of day each time you do it.   Mark the day and time, then see how long it takes for you to feel 10 movements (kicks, flutters, swishes, rolls). You should have at least 10 movements within 2 hours. You will most likely feel 10 movements in much less than 2 hours. If you do not, wait an hour and count again. After a couple of days you will see a pattern.   What you are looking for is a change in the pattern or not enough counts in 2 hours. Is it taking longer in time to reach 10 movements?  SEEK MEDICAL CARE IF:  You feel less than 10 counts in 2 hours. Tried twice.   No movement in one hour.   The pattern is changing or taking longer each day to reach 10 counts in 2 hours.   You feel the baby is not moving as it usually does.  Date: ____________ Movements: ____________ Start time: ____________ Finish time: ____________  Date: ____________ Movements: ____________ Start time: ____________ Finish time: ____________ Date: ____________ Movements: ____________ Start time: ____________ Finish time: ____________ Date: ____________ Movements: ____________ Start time: ____________ Finish time: ____________ Date: ____________ Movements: ____________ Start time: ____________ Finish time:  ____________ Date: ____________ Movements: ____________ Start time: ____________ Finish time: ____________ Date: ____________ Movements: ____________ Start time: ____________ Finish time: ____________ Date: ____________ Movements: ____________ Start time: ____________ Finish time: ____________  Date: ____________ Movements: ____________ Start time: ____________ Finish time: ____________ Date: ____________ Movements: ____________ Start time: ____________ Finish time: ____________ Date: ____________ Movements: ____________ Start time: ____________ Finish time: ____________ Date: ____________ Movements: ____________ Start time: ____________ Finish time: ____________ Date: ____________ Movements: ____________ Start time: ____________ Finish time: ____________ Date: ____________ Movements: ____________ Start time: ____________ Finish time: ____________ Date: ____________ Movements: ____________ Start time: ____________ Finish time: ____________  Date: ____________ Movements: ____________ Start time: ____________ Finish time: ____________ Date: ____________ Movements: ____________ Start time: ____________ Finish time: ____________ Date: ____________ Movements: ____________ Start time: ____________ Finish time: ____________ Date: ____________ Movements: ____________ Start time: ____________ Finish time: ____________ Date: ____________ Movements: ____________ Start time: ____________ Finish time: ____________ Date: ____________ Movements: ____________ Start time: ____________ Finish time: ____________ Date: ____________ Movements: ____________ Start time: ____________ Finish time: ____________  Date: ____________ Movements: ____________ Start time: ____________ Finish time: ____________ Date: ____________ Movements: ____________ Start time: ____________ Finish time: ____________ Date: ____________ Movements: ____________ Start time: ____________ Finish time: ____________ Date: ____________ Movements:  ____________ Start time: ____________ Finish time: ____________ Date: ____________ Movements: ____________ Start time: ____________ Finish time: ____________ Date: ____________ Movements: ____________ Start time: ____________ Finish time: ____________ Date: ____________ Movements: ____________ Start time: ____________ Finish time: ____________  Date: ____________ Movements: ____________ Start time: ____________ Finish time: ____________ Date: ____________ Movements: ____________ Start time: ____________ Finish time: ____________ Date: ____________ Movements: ____________ Start time:   ____________ Finish time: ____________ Date: ____________ Movements: ____________ Start time: ____________ Finish time: ____________ Date: ____________ Movements: ____________ Start time: ____________ Finish time: ____________ Date: ____________ Movements: ____________ Start time: ____________ Finish time: ____________ Date: ____________ Movements: ____________ Start time: ____________ Finish time: ____________  Date: ____________ Movements: ____________ Start time: ____________ Finish time: ____________ Date: ____________ Movements: ____________ Start time: ____________ Finish time: ____________ Date: ____________ Movements: ____________ Start time: ____________ Finish time: ____________ Date: ____________ Movements: ____________ Start time: ____________ Finish time: ____________ Date: ____________ Movements: ____________ Start time: ____________ Finish time: ____________ Date: ____________ Movements: ____________ Start time: ____________ Finish time: ____________ Date: ____________ Movements: ____________ Start time: ____________ Finish time: ____________  Date: ____________ Movements: ____________ Start time: ____________ Finish time: ____________ Date: ____________ Movements: ____________ Start time: ____________ Finish time: ____________ Date: ____________ Movements: ____________ Start time: ____________ Finish  time: ____________ Date: ____________ Movements: ____________ Start time: ____________ Finish time: ____________ Date: ____________ Movements: ____________ Start time: ____________ Finish time: ____________ Date: ____________ Movements: ____________ Start time: ____________ Finish time: ____________ Date: ____________ Movements: ____________ Start time: ____________ Finish time: ____________  Date: ____________ Movements: ____________ Start time: ____________ Finish time: ____________ Date: ____________ Movements: ____________ Start time: ____________ Finish time: ____________ Date: ____________ Movements: ____________ Start time: ____________ Finish time: ____________ Date: ____________ Movements: ____________ Start time: ____________ Finish time: ____________ Date: ____________ Movements: ____________ Start time: ____________ Finish time: ____________ Date: ____________ Movements: ____________ Start time: ____________ Finish time: ____________ Document Released: 10/15/2006 Document Revised: 09/04/2011 Document Reviewed: 04/17/2009 [employer] Patient Information 2012 [employer].Braxton Hicks Contractions Pregnancy is commonly associated with contractions of the uterus throughout the pregnancy. Towards the end of pregnancy (32 to 34 weeks), these contractions (Braxton Hicks) can develop more often and may become more forceful. This is not true labor because these contractions do not result in opening (dilatation) and thinning of the cervix. They are sometimes difficult to tell apart from true labor because these contractions can be forceful and people have different pain tolerances. You should not feel embarrassed if you go to the hospital with false labor. Sometimes, the only way to tell if you are in true labor is for your caregiver to follow the changes in the cervix. How to tell the difference between true and false labor:  False labor.   The contractions of false labor are usually shorter,  irregular and not as hard as those of true labor.   They are often felt in the front of the lower abdomen and in the groin.   They may leave with walking around or changing positions while lying down.   They get weaker and are shorter lasting as time goes on.   These contractions are usually irregular.   They do not usually become progressively stronger, regular and closer together as with true labor.   True labor.   Contractions in true labor last 30 to 70 seconds, become very regular, usually become more intense, and increase in frequency.   They do not go away with walking.   The discomfort is usually felt in the top of the uterus and spreads to the lower abdomen and low back.   True labor can be determined by your caregiver with an exam. This will show that the cervix is dilating and getting thinner.  If there are no prenatal problems or other health problems associated with the pregnancy, it is completely safe to be sent home with false labor and await the onset of true labor. HOME CARE INSTRUCTIONS   Keep up   with your usual exercises and instructions.   Take medications as directed.   Keep your regular prenatal appointment.   Eat and drink lightly if you think you are going into labor.   If BH contractions are making you uncomfortable:   Change your activity position from lying down or resting to walking/walking to resting.   Sit and rest in a tub of warm water.   Drink 2 to 3 glasses of water. Dehydration may cause B-H contractions.   Do slow and deep breathing several times an hour.  SEEK IMMEDIATE MEDICAL CARE IF:   Your contractions continue to become stronger, more regular, and closer together.   You have a gushing, burst or leaking of fluid from the vagina.   An oral temperature above 102 F (38.9 C) develops.   You have passage of blood-tinged mucus.   You develop vaginal bleeding.   You develop continuous belly (abdominal) pain.   You have low  back pain that you never had before.   You feel the baby's head pushing down causing pelvic pressure.   The baby is not moving as much as it used to.  Document Released: 09/15/2005 Document Revised: 09/04/2011 Document Reviewed: 03/09/2009 [employer] Patient Information 2012 [employer]. 

## 2012-02-07 NOTE — MAU Provider Note (Signed)
  History     CSN: [id number]  Arrival date and time: 02/07/12 1550   First Provider Initiated Contact with Patient 02/07/12 1639      No chief complaint on file.  HPI This is a 26 y.o. at [redacted]w[redacted]d who presents with c/o leaking, pressure and pain. Had some wetness but did not soak pads. States was 3cm in office last week. Asked to see patient to rule out ROM.  OB History    Grav Para Term Preterm Abortions TAB SAB Ect Mult Living   4 1 1  0 2 1 1  0 0 1      Past Medical History  Diagnosis Date  . No pertinent past medical history   . Pregnancy as incidental finding     Past Surgical History  Procedure Date  . Wisdom tooth extraction   . Orif finger / thumb fracture     Family History  Problem Relation Age of Onset  . Hypertension Father     History  Substance Use Topics  . Smoking status: Current Everyday Smoker -- 0.2 packs/day  . Smokeless tobacco: Not on file  . Alcohol Use: No    Allergies: No Known Allergies  Prescriptions prior to admission  Medication Sig Dispense Refill  . acetaminophen (TYLENOL) 500 MG tablet Take 500 mg by mouth every 6 (six) hours as needed. Head ache pain      . cyclobenzaprine (FLEXERIL) 10 MG tablet Take 10 mg by mouth 3 (three) times daily as needed. For muscle spasm      . Prenatal Vit-Fe Fumarate-FA (PRENATAL MULTIVITAMIN) TABS Take 1 tablet by mouth daily.        . ranitidine (ZANTAC) 150 MG tablet Take 1 tablet (150 mg total) by mouth 2 (two) times daily.  30 tablet  1  . zolpidem (AMBIEN) 10 MG tablet Take 10 mg by mouth at bedtime as needed. For sleep        Review of Systems  Constitutional: Negative for fever.  Gastrointestinal: Positive for abdominal pain. Negative for nausea, vomiting, diarrhea and constipation.    Physical Exam   Blood pressure 124/74, pulse 116, temperature 97.1 F (36.2 C), temperature source Oral, resp. rate 18, height 5\' 3"  (1.6 m), weight 201 lb (91.173 kg).  Physical Exam  Constitutional:  She is oriented to person, place, and time. She appears well-developed and well-nourished. No distress.  HENT:  Head: Normocephalic.  Cardiovascular: Normal rate.   Respiratory: Effort normal.  GI: Soft. She exhibits no distension. There is no tenderness. There is no rebound and no guarding.  Genitourinary: Uterus normal. Vaginal discharge (small amount of thick white discharge, no pooling, no ferning) found.  Musculoskeletal: Normal range of motion.  Neurological: She is alert and oriented to person, place, and time.  Skin: Skin is warm and dry.  Psychiatric: She has a normal mood and affect.   Cervix:  Dilation: 2 Effacement (%): 50 Cervical Position: Posterior Station: Ballotable Presentation: Vertex Exam by:: Wynelle Bourgeois, cnm  MAU Course  Procedures   Assessment and Plan  A:  SIUP at [redacted]w[redacted]d      No evidence of ROM      Irregular contractions P:  RN will contact Dr Tamela Oddi  [facility] 02/07/2012, 4:41 PM

## 2012-02-16 ENCOUNTER — Inpatient Hospital Stay (HOSPITAL_COMMUNITY)
Admission: AD | Admit: 2012-02-16 | Discharge: 2012-02-16 | Disposition: A | Discharge status: Hospice | Payer: Medicaid Other | Source: Ambulatory Visit | Attending: Obstetrics & Gynecology | Admitting: Obstetrics & Gynecology

## 2012-02-16 DIAGNOSIS — O479 False labor, unspecified: Secondary | ICD-10-CM | POA: Insufficient documentation

## 2012-02-16 NOTE — Discharge Instructions (Signed)

## 2012-02-17 NOTE — MAU Provider Note (Signed)
Attestation of Attending Supervision of Advanced Practitioner: Evaluation and management procedures were performed by the [facility] Fellow/PA/CNM/NP under my supervision and collaboration. Chart reviewed, and agree with management and plan.  Jaynie Collins, M.D. 02/17/2012 8:07 PM

## 2012-02-23 ENCOUNTER — Inpatient Hospital Stay (HOSPITAL_COMMUNITY)
Admission: AD | Admit: 2012-02-23 | Discharge: 2012-02-26 | DRG: 775 | Disposition: A | Discharge status: Home / Self Care | Payer: Medicaid Other | Source: Ambulatory Visit | Attending: Obstetrics & Gynecology | Admitting: Obstetrics & Gynecology

## 2012-02-23 NOTE — MAU Note (Signed)
Pt reports leaking fluid x 1 hour, contractions.

## 2012-02-24 ENCOUNTER — Inpatient Hospital Stay (HOSPITAL_COMMUNITY): Payer: Medicaid Other | Admitting: Anesthesiology

## 2012-02-24 ENCOUNTER — Encounter (HOSPITAL_COMMUNITY): Payer: Self-pay | Admitting: Anesthesiology

## 2012-02-24 ENCOUNTER — Encounter (HOSPITAL_COMMUNITY): Payer: Self-pay | Admitting: *Deleted

## 2012-02-24 LAB — CBC
HCT: 34 % — ABNORMAL LOW (ref 36.0–46.0)
Hemoglobin: 11.2 g/dL — ABNORMAL LOW (ref 12.0–15.0)
MCH: 29.9 pg (ref 26.0–34.0)
MCHC: 32.9 g/dL (ref 30.0–36.0)
MCV: 90.9 fL (ref 78.0–100.0)
Platelets: 379 10*3/uL (ref 150–400)
RBC: 3.74 MIL/uL — ABNORMAL LOW (ref 3.87–5.11)
RDW: 13.7 % (ref 11.5–15.5)
WBC: 23.4 10*3/uL — ABNORMAL HIGH (ref 4.0–10.5)

## 2012-02-24 LAB — RPR: RPR Ser Ql: NONREACTIVE

## 2012-02-24 LAB — OB RESULTS CONSOLE GBS: GBS: NEGATIVE

## 2012-02-24 MED ORDER — LANOLIN HYDROUS EX OINT: TOPICAL_OINTMENT | CUTANEOUS | Status: DC | PRN

## 2012-02-24 MED ORDER — OXYCODONE-ACETAMINOPHEN 5-325 MG PO TABS
1.0000 | ORAL_TABLET | ORAL | Status: DC | PRN
  Administered 2012-02-25: 1 via ORAL
  Administered 2012-02-26: 2 via ORAL
  Filled 2012-02-24: qty 2
  Filled 2012-02-24: qty 1

## 2012-02-24 MED ORDER — BENZOCAINE-MENTHOL 20-0.5 % EX AERO: 1.0000 "application " | INHALATION_SPRAY | CUTANEOUS | Status: DC | PRN

## 2012-02-24 MED ORDER — ONDANSETRON HCL 4 MG/2ML IJ SOLN: 4.0000 mg | INTRAMUSCULAR | Status: DC | PRN

## 2012-02-24 MED ORDER — FENTANYL 2.5 MCG/ML BUPIVACAINE 1/10 % EPIDURAL INFUSION (WH - ANES)
14.0000 mL/h | INTRAMUSCULAR | Status: DC
  Administered 2012-02-24: 4 mL/h via EPIDURAL
  Administered 2012-02-24 (×2): 14 mL/h via EPIDURAL
  Filled 2012-02-24 (×2): qty 60

## 2012-02-24 MED ORDER — PHENYLEPHRINE 40 MCG/ML (10ML) SYRINGE FOR IV PUSH (FOR BLOOD PRESSURE SUPPORT)
80.0000 ug | PREFILLED_SYRINGE | INTRAVENOUS | Status: DC | PRN
  Filled 2012-02-24: qty 2
  Filled 2012-02-24: qty 5

## 2012-02-24 MED ORDER — IBUPROFEN 600 MG PO TABS
600.0000 mg | ORAL_TABLET | Freq: Four times a day (QID) | ORAL | Status: DC
  Administered 2012-02-24 – 2012-02-26 (×8): 600 mg via ORAL
  Filled 2012-02-24 (×9): qty 1

## 2012-02-24 MED ORDER — LACTATED RINGERS IV SOLN
INTRAVENOUS | Status: DC
  Administered 2012-02-24: 02:00:00 via INTRAVENOUS

## 2012-02-24 MED ORDER — EPHEDRINE 5 MG/ML INJ: 10.0000 mg | INTRAVENOUS | Status: DC | PRN

## 2012-02-24 MED ORDER — TETANUS-DIPHTH-ACELL PERTUSSIS 5-2.5-18.5 LF-MCG/0.5 IM SUSP
0.5000 mL | Freq: Once | INTRAMUSCULAR | Status: AC
  Administered 2012-02-25: 0.5 mL via INTRAMUSCULAR

## 2012-02-24 MED ORDER — LACTATED RINGERS IV SOLN
500.0000 mL | Freq: Once | INTRAVENOUS | Status: DC
Start: 2012-02-24 — End: 2012-02-24

## 2012-02-24 MED ORDER — WITCH HAZEL-GLYCERIN EX PADS: 1.0000 "application " | MEDICATED_PAD | CUTANEOUS | Status: DC | PRN

## 2012-02-24 MED ORDER — NICOTINE 7 MG/24HR TD PT24
7.0000 mg | MEDICATED_PATCH | Freq: Every day | TRANSDERMAL | Status: AC
  Administered 2012-02-24: 7 mg via TRANSDERMAL
  Filled 2012-02-24: qty 1

## 2012-02-24 MED ORDER — DIBUCAINE 1 % RE OINT: 1.0000 "application " | TOPICAL_OINTMENT | RECTAL | Status: DC | PRN

## 2012-02-24 MED ORDER — LACTATED RINGERS IV SOLN: 500.0000 mL | INTRAVENOUS | Status: DC | PRN

## 2012-02-24 MED ORDER — TERBUTALINE SULFATE 1 MG/ML IJ SOLN: 0.2500 mg | Freq: Once | INTRAMUSCULAR | Status: DC | PRN

## 2012-02-24 MED ORDER — FENTANYL 2.5 MCG/ML BUPIVACAINE 1/10 % EPIDURAL INFUSION (WH - ANES)
14.0000 mL/h | INTRAMUSCULAR | Status: DC
Start: 2012-02-24 — End: 2012-02-24

## 2012-02-24 MED ORDER — ONDANSETRON HCL 4 MG PO TABS: 4.0000 mg | ORAL_TABLET | ORAL | Status: DC | PRN

## 2012-02-24 MED ORDER — MEDROXYPROGESTERONE ACETATE 150 MG/ML IM SUSP: 150.0000 mg | INTRAMUSCULAR | Status: DC | PRN

## 2012-02-24 MED ORDER — OXYTOCIN 20 UNITS IN LACTATED RINGERS INFUSION - SIMPLE: 125.0000 mL/h | Freq: Once | INTRAVENOUS | Status: DC

## 2012-02-24 MED ORDER — PRENATAL MULTIVITAMIN CH
1.0000 | ORAL_TABLET | Freq: Every day | ORAL | Status: DC
  Administered 2012-02-24 – 2012-02-26 (×3): 1 via ORAL
  Filled 2012-02-24 (×3): qty 1

## 2012-02-24 MED ORDER — OXYTOCIN 20 UNITS IN LACTATED RINGERS INFUSION - SIMPLE: 125.0000 mL/h | INTRAVENOUS | Status: DC | PRN

## 2012-02-24 MED ORDER — PHENYLEPHRINE 40 MCG/ML (10ML) SYRINGE FOR IV PUSH (FOR BLOOD PRESSURE SUPPORT): 80.0000 ug | PREFILLED_SYRINGE | INTRAVENOUS | Status: DC | PRN

## 2012-02-24 MED ORDER — SENNOSIDES-DOCUSATE SODIUM 8.6-50 MG PO TABS
2.0000 | ORAL_TABLET | Freq: Every day | ORAL | Status: DC
  Administered 2012-02-24 – 2012-02-25 (×2): 2 via ORAL

## 2012-02-24 MED ORDER — ZOLPIDEM TARTRATE 5 MG PO TABS: 5.0000 mg | ORAL_TABLET | Freq: Every evening | ORAL | Status: DC | PRN

## 2012-02-24 MED ORDER — OXYTOCIN 20 UNITS IN LACTATED RINGERS INFUSION - SIMPLE
1.0000 m[IU]/min | INTRAVENOUS | Status: DC
  Administered 2012-02-24: 2 m[IU]/min via INTRAVENOUS
  Administered 2012-02-24: 333 m[IU]/min via INTRAVENOUS
  Filled 2012-02-24: qty 1000

## 2012-02-24 MED ORDER — EPHEDRINE 5 MG/ML INJ
10.0000 mg | INTRAVENOUS | Status: DC | PRN
  Filled 2012-02-24: qty 2

## 2012-02-24 MED ORDER — FLEET ENEMA 7-19 GM/118ML RE ENEM: 1.0000 | ENEMA | RECTAL | Status: DC | PRN

## 2012-02-24 MED ORDER — EPHEDRINE 5 MG/ML INJ
10.0000 mg | INTRAVENOUS | Status: DC | PRN
  Filled 2012-02-24: qty 2
  Filled 2012-02-24: qty 4

## 2012-02-24 MED ORDER — METHYLERGONOVINE MALEATE 0.2 MG/ML IJ SOLN: 0.2000 mg | INTRAMUSCULAR | Status: DC | PRN

## 2012-02-24 MED ORDER — ACETAMINOPHEN 325 MG PO TABS: 650.0000 mg | ORAL_TABLET | ORAL | Status: DC | PRN

## 2012-02-24 MED ORDER — IBUPROFEN 600 MG PO TABS: 600.0000 mg | ORAL_TABLET | Freq: Four times a day (QID) | ORAL | Status: DC | PRN

## 2012-02-24 MED ORDER — PHENYLEPHRINE 40 MCG/ML (10ML) SYRINGE FOR IV PUSH (FOR BLOOD PRESSURE SUPPORT)
80.0000 ug | PREFILLED_SYRINGE | INTRAVENOUS | Status: DC | PRN
  Filled 2012-02-24: qty 2

## 2012-02-24 MED ORDER — LIDOCAINE HCL (PF) 1 % IJ SOLN
30.0000 mL | INTRAMUSCULAR | Status: DC | PRN
  Filled 2012-02-24 (×2): qty 30

## 2012-02-24 MED ORDER — OXYTOCIN BOLUS FROM INFUSION
500.0000 mL | Freq: Once | INTRAVENOUS | Status: DC
  Filled 2012-02-24: qty 500

## 2012-02-24 MED ORDER — DIPHENHYDRAMINE HCL 50 MG/ML IJ SOLN: 12.5000 mg | INTRAMUSCULAR | Status: DC | PRN

## 2012-02-24 MED ORDER — LACTATED RINGERS IV SOLN: 500.0000 mL | Freq: Once | INTRAVENOUS | Status: DC

## 2012-02-24 MED ORDER — ONDANSETRON HCL 4 MG/2ML IJ SOLN: 4.0000 mg | Freq: Four times a day (QID) | INTRAMUSCULAR | Status: DC | PRN

## 2012-02-24 MED ORDER — CITRIC ACID-SODIUM CITRATE 334-500 MG/5ML PO SOLN: 30.0000 mL | ORAL | Status: DC | PRN

## 2012-02-24 MED ORDER — METHYLERGONOVINE MALEATE 0.2 MG PO TABS: 0.2000 mg | ORAL_TABLET | ORAL | Status: DC | PRN

## 2012-02-24 MED ORDER — OXYCODONE-ACETAMINOPHEN 5-325 MG PO TABS: 1.0000 | ORAL_TABLET | ORAL | Status: DC | PRN

## 2012-02-24 MED ORDER — SIMETHICONE 80 MG PO CHEW: 80.0000 mg | CHEWABLE_TABLET | ORAL | Status: DC | PRN

## 2012-02-24 MED ORDER — DIPHENHYDRAMINE HCL 25 MG PO CAPS: 25.0000 mg | ORAL_CAPSULE | Freq: Four times a day (QID) | ORAL | Status: DC | PRN

## 2012-02-24 NOTE — Anesthesia Preprocedure Evaluation (Signed)
Anesthesia Evaluation  Patient identified by MRN, date of birth, ID band Patient awake    Reviewed: Allergy & Precautions, H&P , NPO status , Patient's Chart, lab work & pertinent test results, reviewed documented beta blocker date and time   History of Anesthesia Complications Negative for: history of anesthetic complications  Airway Mallampati: I TM Distance: >3 FB Neck ROM: full    Dental  (+) Chipped   Pulmonary Current Smoker,  breath sounds clear to auscultation        Cardiovascular negative cardio ROS  Rhythm:regular Rate:Normal     Neuro/Psych negative neurological ROS  negative psych ROS   GI/Hepatic Neg liver ROS, GERD-  Medicated,  Endo/Other  negative endocrine ROS  Renal/GU negative Renal ROS     Musculoskeletal   Abdominal   Peds  Hematology negative hematology ROS (+)   Anesthesia Other Findings   Reproductive/Obstetrics (+) Pregnancy                           Anesthesia Physical Anesthesia Plan  ASA: II  Anesthesia Plan: Epidural   Post-op Pain Management:    Induction:   Airway Management Planned:   Additional Equipment:   Intra-op Plan:   Post-operative Plan:   Informed Consent: I have reviewed the patients History and Physical, chart, labs and discussed the procedure including the risks, benefits and alternatives for the proposed anesthesia with the patient or authorized representative who has indicated his/her understanding and acceptance.     Plan Discussed with:   Anesthesia Plan Comments:         Anesthesia Quick Evaluation

## 2012-02-24 NOTE — MAU Provider Note (Signed)
SSE performed: No pooling, + mucoid discharge, + Fern  Cervix: 3/50/vtx/-2 FHR: 145, mod variability, + 15x15 accels Toco: q 9-10 mins, palp mild  RN to notify Dr. Tamela Oddi  1:00 AM Maylon Cos E. 02/24/2012

## 2012-02-24 NOTE — Progress Notes (Signed)
Brooke Carrillo notified patient SROM at 2330 clear fluid. Fern positive. SVE 3/50/-2. Contractions every 7-9 minutes Reactive fetal tracing. Admit orders, epidural prn and start pitocin in 2 hours if no change.

## 2012-02-24 NOTE — Anesthesia Procedure Notes (Signed)
Epidural Patient location during procedure: OB Start time: 02/24/2012 6:26 AM Reason for block: procedure for pain  Staffing Performed by: anesthesiologist   Preanesthetic Checklist Completed: patient identified, site marked, surgical consent, pre-op evaluation, timeout performed, IV checked, risks and benefits discussed and monitors and equipment checked  Epidural Patient position: sitting Prep: site prepped and draped and DuraPrep Patient monitoring: continuous pulse ox and blood pressure Approach: midline Injection technique: LOR air  Needle:  Needle type: Tuohy  Needle gauge: 17 G Needle length: 9 cm Needle insertion depth: 5 cm cm Catheter type: closed end flexible Catheter size: 19 Gauge Catheter at skin depth: 10 cm Test dose: negative  Assessment Events: blood not aspirated, injection not painful, no injection resistance, negative IV test and no paresthesia  Additional Notes Discussed risk of headache, infection, bleeding, nerve injury and failed or incomplete block.  Patient voices understanding and wishes to proceed.

## 2012-02-24 NOTE — H&P (Signed)
Brooke Carrillo is Carrillo 26 y.o. female presenting for SROM. Maternal Medical History:  Reason for admission: Reason for admission: rupture of membranes.  Fetal activity: Perceived fetal activity is normal.    Prenatal complications: Substance abuse: tobaco use.     OB History    Grav Para Term Preterm Abortions TAB SAB Ect Mult Living   4 1 1  0 2 1 1  0 0 1     Past Medical History  Diagnosis Date  . No pertinent past medical history   . Pregnancy as incidental finding    Past Surgical History  Procedure Date  . Wisdom tooth extraction   . Orif finger / thumb fracture    Family History: family history includes Hypertension in her father. Social History:  reports that she has been smoking.  She does not have any smokeless tobacco history on file. She reports that she does not drink alcohol or use illicit drugs.  Review of Systems  Constitutional: Negative for fever.  Eyes: Negative for blurred vision.  Respiratory: Negative for shortness of breath.   Gastrointestinal: Negative for vomiting.  Skin: Negative for rash.  Neurological: Negative for headaches.    Dilation: 4 Effacement (%): 60 Station: -3 Exam by:: L. Lima, RN Blood pressure 119/57, pulse 61, temperature 97.8 F (36.6 C), temperature source Oral, resp. rate 16, height 5\' 3"  (1.6 m), weight 90.266 kg (199 lb), SpO2 97.00%. Maternal Exam:  Uterine Assessment: Contraction strength is mild.  Contraction frequency is irregular.   Abdomen: Patient reports no abdominal tenderness. Fetal presentation: vertex  Introitus: not evaluated.   Cervix: Cervix evaluated by digital exam.     Fetal Exam Fetal Monitor Review: Variability: moderate (6-25 bpm).   Pattern: no accelerations.    Fetal State Assessment: Category I - tracings are normal.     Physical Exam  Constitutional: She appears well-developed.  HENT:  Head: Normocephalic.  Neck: Neck supple. No thyromegaly present.  Cardiovascular: Normal rate and  regular rhythm.   Respiratory: Breath sounds normal.  GI: Soft. Bowel sounds are normal.  Skin: No rash noted.    Prenatal labs: ABO, Rh: --/--/O NEG (03/06 1131) Antibody: NEG (03/06 1131) Rubella: Immune (12/04 0000) RPR: Nonreactive (12/04 0000)  HBsAg: Negative (12/04 0000)  HIV: Non-reactive (12/04 0000)  GBS: Negative (05/28 0000)   Assessment/Plan: Mulitpara w/an IUP at term/SROM.  Prodromal labor  Admit Likely augmentation of labor w/low dose Pitocin per protocol   Brooke Carrillo 02/24/2012, 9:37 AM

## 2012-02-24 NOTE — MAU Provider Note (Deleted)
SSE performed: No pooling, + mucoid discharge, + Fern  Cervix: 3/50/vtx/-2 FHR: 145, mod variability, + 15x15 accels Toco: q 9-10 mins, palp mild  RN to notify Dr. Jackson-Moore  1:00 AM Brooke Carrillo. 02/24/2012  

## 2012-02-24 NOTE — Progress Notes (Signed)
Brooke Carrillo is a 26 y.o. [id number] at [redacted]w[redacted]d by LMP admitted for SROM.  Subjective:   Objective: BP 126/84  Pulse 89  Temp(Src) 97.7 F (36.5 C) (Oral)  Resp 18  Ht 5\' 3"  (1.6 m)  Wt 90.266 kg (199 lb)  BMI 35.25 kg/m2  SpO2 97%   Total I/O In: -  Out: 800 [Urine:800]  FHT:  FHR: 150 bpm, variability: moderate,  accelerations:  Present,  decelerations:  Absent UC:   regular, every 3 minutes SVE:   Dilation:  (svd) Effacement (%): 100 Station: +2 Exam by:: Currie Paris, RN  Labs: Lab Results  Component Value Date   WBC 23.4* 02/24/2012   HGB 11.2* 02/24/2012   HCT 34.0* 02/24/2012   MCV 90.9 02/24/2012   PLT 379 02/24/2012    Assessment / Plan: Spontaneous labor, progressing normally  Labor: Progressing normally Preeclampsia:  n/a Fetal Wellbeing:  Category I Pain Control:  Epidural I/D:  n/a Anticipated MOD:  NSVD  Farrell Broerman A 02/24/2012, 12:45 PM

## 2012-02-25 LAB — CBC
HCT: 33.9 % — ABNORMAL LOW (ref 36.0–46.0)
Hemoglobin: 10.9 g/dL — ABNORMAL LOW (ref 12.0–15.0)
MCH: 29.7 pg (ref 26.0–34.0)
MCHC: 32.2 g/dL (ref 30.0–36.0)
MCV: 92.4 fL (ref 78.0–100.0)
Platelets: 369 10*3/uL (ref 150–400)
RBC: 3.67 MIL/uL — ABNORMAL LOW (ref 3.87–5.11)
RDW: 13.9 % (ref 11.5–15.5)
WBC: 19.6 10*3/uL — ABNORMAL HIGH (ref 4.0–10.5)

## 2012-02-25 MED ORDER — RHO D IMMUNE GLOBULIN 1500 UNIT/2ML IJ SOLN
300.0000 ug | Freq: Once | INTRAMUSCULAR | Status: AC
  Administered 2012-02-25: 300 ug via INTRAMUSCULAR
  Filled 2012-02-25: qty 2

## 2012-02-25 NOTE — Progress Notes (Signed)
Post Partum Day 1 Subjective: no complaints  Objective: Blood pressure 116/76, pulse 65, temperature 97.8 F (36.6 C), temperature source Oral, resp. rate 18, height 5\' 3"  (1.6 m), weight 90.266 kg (199 lb), SpO2 97.00%, unknown if currently breastfeeding.  Physical Exam:  General: alert and no distress Lochia: appropriate Uterine Fundus: firm Incision: none DVT Evaluation: No evidence of DVT seen on physical exam.   Basename 02/25/12 0607 02/24/12 0202  HGB 10.9* 11.2*  HCT 33.9* 34.0*    Assessment/Plan: Plan for discharge tomorrow   LOS: 2 days   Kashawn Dirr A 02/25/2012, 8:30 AM

## 2012-02-25 NOTE — Progress Notes (Signed)
UR chart review completed.  

## 2012-02-25 NOTE — Anesthesia Postprocedure Evaluation (Signed)
  Anesthesia Post-op Note  Patient: Brooke Carrillo  Procedure(s) Performed: * No procedures listed *  Patient Location: PACU and Mother/Baby  Anesthesia Type: Epidural  Level of Consciousness: awake, alert  and oriented  Airway and Oxygen Therapy: Patient Spontanous Breathing  Post-op Pain: mild  Post-op Assessment: Post-op Vital signs reviewed, Patient's Cardiovascular Status Stable and Respiratory Function Stable  Post-op Vital Signs: stable  Complications: No apparent anesthesia complications

## 2012-02-26 LAB — RH IG WORKUP (INCLUDES ABO/RH)
ABO/RH(D): O NEG
Fetal Screen: NEGATIVE
Gestational Age(Wks): 40.2
Unit division: 0

## 2012-02-26 MED ORDER — OXYCODONE-ACETAMINOPHEN 5-325 MG PO TABS: 1.0000 | ORAL_TABLET | ORAL | Status: AC | PRN

## 2012-02-26 MED ORDER — IBUPROFEN 600 MG PO TABS: 600.0000 mg | ORAL_TABLET | Freq: Four times a day (QID) | ORAL | Status: DC

## 2012-02-26 NOTE — Progress Notes (Signed)
Post Partum Day 2 Subjective: no complaints  Objective: Blood pressure 104/60, pulse 77, temperature 97.9 F (36.6 C), temperature source Oral, resp. rate 18, height 5\' 3"  (1.6 m), weight 90.266 kg (199 lb), SpO2 97.00%, unknown if currently breastfeeding.  Physical Exam:  General: alert and no distress Lochia: appropriate Uterine Fundus: firm Incision: healing well DVT Evaluation: No evidence of DVT seen on physical exam.   Basename 02/25/12 0607 02/24/12 0202  HGB 10.9* 11.2*  HCT 33.9* 34.0*    Assessment/Plan: Discharge home   LOS: 3 days   Brooke Carrillo A 02/26/2012, 8:47 AM

## 2012-02-26 NOTE — Discharge Summary (Signed)
Obstetric Discharge Summary Reason for Admission: onset of labor Prenatal Procedures: ultrasound Intrapartum Procedures: spontaneous vaginal delivery Postpartum Procedures: none Complications-Operative and Postpartum: none Hemoglobin  Date Value Range Status  02/25/2012 10.9* 12.0-15.0 (g/dL) Final     HCT  Date Value Range Status  02/25/2012 33.9* 36.0-46.0 (%) Final    Physical Exam:  General: alert and no distress Lochia: appropriate Uterine Fundus: firm Incision: none DVT Evaluation: No evidence of DVT seen on physical exam.  Discharge Diagnoses: Term Pregnancy-delivered  Discharge Information: Date: 02/26/2012 Activity: pelvic rest Diet: routine Medications: PNV, Ibuprofen, Colace and Percocet Condition: stable Instructions: refer to practice specific booklet Discharge to: home Follow-up Information    Follow up with Syd Manges A, MD. Schedule an appointment as soon as possible for a visit in 6 weeks.   Contact information:   [address] [place] [address] [phone]          Newborn Data: Live born female  Birth Weight: 7 lb 13.9 oz (3570 g) APGAR: 9, 9  Home with mother.  Khai Arrona A 02/26/2012, 8:53 AM

## 2012-08-18 ENCOUNTER — Emergency Department (HOSPITAL_COMMUNITY): Payer: Medicaid Other

## 2012-08-18 ENCOUNTER — Emergency Department (HOSPITAL_COMMUNITY)
Admission: EM | Admit: 2012-08-18 | Discharge: 2012-08-18 | Disposition: A | Discharge status: Home / Self Care | Payer: Medicaid Other | Attending: Emergency Medicine | Admitting: Emergency Medicine

## 2012-08-18 ENCOUNTER — Encounter (HOSPITAL_COMMUNITY): Payer: Self-pay | Admitting: Emergency Medicine

## 2012-08-18 DIAGNOSIS — J069 Acute upper respiratory infection, unspecified: Secondary | ICD-10-CM | POA: Insufficient documentation

## 2012-08-18 DIAGNOSIS — J40 Bronchitis, not specified as acute or chronic: Secondary | ICD-10-CM | POA: Insufficient documentation

## 2012-08-18 DIAGNOSIS — R112 Nausea with vomiting, unspecified: Secondary | ICD-10-CM | POA: Insufficient documentation

## 2012-08-18 DIAGNOSIS — R61 Generalized hyperhidrosis: Secondary | ICD-10-CM | POA: Insufficient documentation

## 2012-08-18 MED ORDER — HYDROCOD POLST-CHLORPHEN POLST 10-8 MG/5ML PO LQCR: 5.0000 mL | Freq: Two times a day (BID) | ORAL | Status: DC | PRN

## 2012-08-18 MED ORDER — HYDROCOD POLST-CHLORPHEN POLST 10-8 MG/5ML PO LQCR
5.0000 mL | Freq: Once | ORAL | Status: AC
  Administered 2012-08-18: 5 mL via ORAL
  Filled 2012-08-18: qty 5

## 2012-08-18 NOTE — ED Provider Notes (Signed)
History    This chart was scribed for Brooke Guppy, MD, MD by Smitty Pluck, ED Scribe. The patient was seen in room TR11C and the patient's care was started at 7:40PM.   CSN: [id number]  Arrival date & time 08/18/12  1826      Chief Complaint  Patient presents with  . Cough  . URI    (Consider location/radiation/quality/duration/timing/severity/associated sxs/prior treatment) The history is provided by the patient. No language interpreter was used.   Brooke Carrillo is a 26 y.o. female who presents to the Emergency Department complaining of constant, moderate productive cough with white sputum onset 1 week ago. Pt reports that she had diaphoresis. She states that she vomited 1x 6 days ago but nausea has subsided. She denies abdominal pain, fever, chills and any other pain.   Past Medical History  Diagnosis Date  . No pertinent past medical history   . Pregnancy as incidental finding     Past Surgical History  Procedure Date  . Wisdom tooth extraction   . Orif finger / thumb fracture     Family History  Problem Relation Age of Onset  . Hypertension Father     History  Substance Use Topics  . Smoking status: Current Every Day Smoker -- 0.2 packs/day  . Smokeless tobacco: Not on file  . Alcohol Use: No    OB History    Grav Para Term Preterm Abortions TAB SAB Ect Mult Living   4 2 2  0 2 1 1  0 0 2      Review of Systems  Constitutional: Positive for diaphoresis. Negative for fever and chills.  Respiratory: Positive for cough. Negative for shortness of breath.   Gastrointestinal: Positive for nausea and vomiting. Negative for abdominal pain.  Neurological: Negative for weakness.  All other systems reviewed and are negative.    Allergies  Review of patient's allergies indicates no known allergies.  Home Medications   Current Outpatient Rx  Name  Route  Sig  Dispense  Refill  . IBUPROFEN 600 MG PO TABS   Oral   Take 1 tablet (600 mg total) by mouth  every 6 (six) hours.   30 tablet   5   . PRENATAL MULTIVITAMIN CH   Oral   Take 1 tablet by mouth daily.           Marland Kitchen RANITIDINE HCL 150 MG PO TABS   Oral   Take 1 tablet (150 mg total) by mouth 2 (two) times daily.   30 tablet   1     BP 132/68  Pulse 87  Temp 97.9 F (36.6 C) (Oral)  Resp 18  SpO2 99%  LMP 08/11/2012  Physical Exam  Nursing note and vitals reviewed. Constitutional: She is oriented to person, place, and time. She appears well-developed and well-nourished. No distress.  HENT:  Head: Normocephalic and atraumatic.  Eyes: EOM are normal.  Neck: Neck supple. No tracheal deviation present.  Cardiovascular: Normal rate.   Pulmonary/Chest: Effort normal and breath sounds normal. No respiratory distress. She has no wheezes. She has no rales.  Musculoskeletal: Normal range of motion.  Neurological: She is alert and oriented to person, place, and time.  Skin: Skin is warm and dry.  Psychiatric: She has a normal mood and affect. Her behavior is normal.    ED Course  Procedures (including critical care time) DIAGNOSTIC STUDIES: Oxygen Saturation is 99% on room air, normal by my interpretation.    COORDINATION OF CARE: 7:43 PM  Discussed ED treatment with pt  7:44 PM Ordered:    . chlorpheniramine-HYDROcodone  5 mL Oral Once       Labs Reviewed - No data to display Dg Chest 2 View  08/18/2012  *RADIOLOGY REPORT*  Clinical Data: Cough  CHEST - 2 VIEW  Comparison: 06/20/2008  Findings: Upper normal heart size.  Clear lungs.  No pneumothorax or pleural effusion.  IMPRESSION: No active cardiopulmonary disease.   Original Report Authenticated By: Jolaine Click, M.D.      No diagnosis found.    MDM  Bronchitis No pneumonia, hypoxia, resp distress    I personally performed the services described in this documentation, which was scribed in my presence. The recorded information has been reviewed and is accurate.     Brooke Guppy, MD 08/18/12  1946

## 2012-08-18 NOTE — ED Notes (Signed)
Pt c/o productive cough with white sputum and pain with cough x 1 week

## 2014-06-12 ENCOUNTER — Encounter (HOSPITAL_COMMUNITY): Payer: Self-pay | Admitting: Emergency Medicine

## 2014-06-12 ENCOUNTER — Emergency Department (HOSPITAL_COMMUNITY): Payer: Medicaid Other

## 2014-06-12 ENCOUNTER — Emergency Department (HOSPITAL_COMMUNITY)
Admission: EM | Admit: 2014-06-12 | Discharge: 2014-06-12 | Disposition: A | Discharge status: Home / Self Care | Payer: Medicaid Other | Attending: Emergency Medicine | Admitting: Emergency Medicine

## 2014-06-12 DIAGNOSIS — O9989 Other specified diseases and conditions complicating pregnancy, childbirth and the puerperium: Secondary | ICD-10-CM | POA: Insufficient documentation

## 2014-06-12 DIAGNOSIS — M545 Low back pain, unspecified: Secondary | ICD-10-CM | POA: Diagnosis not present

## 2014-06-12 DIAGNOSIS — R1032 Left lower quadrant pain: Secondary | ICD-10-CM | POA: Diagnosis not present

## 2014-06-12 DIAGNOSIS — Z6741 Type O blood, Rh negative: Secondary | ICD-10-CM

## 2014-06-12 DIAGNOSIS — R109 Unspecified abdominal pain: Secondary | ICD-10-CM | POA: Insufficient documentation

## 2014-06-12 DIAGNOSIS — O99330 Smoking (tobacco) complicating pregnancy, unspecified trimester: Secondary | ICD-10-CM | POA: Diagnosis not present

## 2014-06-12 DIAGNOSIS — O209 Hemorrhage in early pregnancy, unspecified: Secondary | ICD-10-CM

## 2014-06-12 DIAGNOSIS — Z3A10 10 weeks gestation of pregnancy: Secondary | ICD-10-CM

## 2014-06-12 LAB — CBC WITH DIFFERENTIAL/PLATELET
Basophils Absolute: 0 10*3/uL (ref 0.0–0.1)
Basophils Relative: 0 % (ref 0–1)
Eosinophils Absolute: 0.1 10*3/uL (ref 0.0–0.7)
Eosinophils Relative: 1 % (ref 0–5)
HCT: 34.8 % — ABNORMAL LOW (ref 36.0–46.0)
Hemoglobin: 12.2 g/dL (ref 12.0–15.0)
Lymphocytes Relative: 23 % (ref 12–46)
Lymphs Abs: 2.7 10*3/uL (ref 0.7–4.0)
MCH: 32.1 pg (ref 26.0–34.0)
MCHC: 35.1 g/dL (ref 30.0–36.0)
MCV: 91.6 fL (ref 78.0–100.0)
Monocytes Absolute: 0.4 10*3/uL (ref 0.1–1.0)
Monocytes Relative: 4 % (ref 3–12)
Neutro Abs: 8.2 10*3/uL — ABNORMAL HIGH (ref 1.7–7.7)
Neutrophils Relative %: 72 % (ref 43–77)
Platelets: 301 10*3/uL (ref 150–400)
RBC: 3.8 MIL/uL — ABNORMAL LOW (ref 3.87–5.11)
RDW: 11.7 % (ref 11.5–15.5)
WBC: 11.5 10*3/uL — ABNORMAL HIGH (ref 4.0–10.5)

## 2014-06-12 LAB — URINALYSIS, ROUTINE W REFLEX MICROSCOPIC
Bilirubin Urine: NEGATIVE
Glucose, UA: NEGATIVE mg/dL
Hgb urine dipstick: NEGATIVE
Ketones, ur: NEGATIVE mg/dL
Leukocytes, UA: NEGATIVE
Nitrite: NEGATIVE
Protein, ur: NEGATIVE mg/dL
Specific Gravity, Urine: 1.009 (ref 1.005–1.030)
Urobilinogen, UA: 1 mg/dL (ref 0.0–1.0)
pH: 7.5 (ref 5.0–8.0)

## 2014-06-12 LAB — I-STAT CHEM 8, ED
BUN: 6 mg/dL (ref 6–23)
Calcium, Ion: 1.24 mmol/L — ABNORMAL HIGH (ref 1.12–1.23)
Chloride: 104 mEq/L (ref 96–112)
Creatinine, Ser: 0.6 mg/dL (ref 0.50–1.10)
Glucose, Bld: 81 mg/dL (ref 70–99)
HCT: 38 % (ref 36.0–46.0)
Hemoglobin: 12.9 g/dL (ref 12.0–15.0)
Potassium: 4.2 mEq/L (ref 3.7–5.3)
Sodium: 138 mEq/L (ref 137–147)
TCO2: 23 mmol/L (ref 0–100)

## 2014-06-12 LAB — POC URINE PREG, ED: Preg Test, Ur: POSITIVE — AB

## 2014-06-12 LAB — WET PREP, GENITAL
Clue Cells Wet Prep HPF POC: NONE SEEN
Trich, Wet Prep: NONE SEEN
Yeast Wet Prep HPF POC: NONE SEEN

## 2014-06-12 LAB — HCG, QUANTITATIVE, PREGNANCY: hCG, Beta Chain, Quant, S: 86580 m[IU]/mL — ABNORMAL HIGH (ref <5)

## 2014-06-12 MED ORDER — PRENATAL VITAMINS 28-0.8 MG PO TABS: 1.0000 | ORAL_TABLET | Freq: Every day | ORAL | Status: DC

## 2014-06-12 MED ORDER — RHO D IMMUNE GLOBULIN 1500 UNIT/2ML IJ SOSY
300.0000 ug | PREFILLED_SYRINGE | Freq: Once | INTRAMUSCULAR | Status: AC
  Administered 2014-06-12: 300 ug via INTRAMUSCULAR

## 2014-06-12 NOTE — Discharge Instructions (Signed)
Before Baby Comes Home °Ask any questions about feeding, diapering, and baby care before you leave the hospital. Ask again if you do not understand. Ask when you need to see the doctor again. °There are several things you must have before your baby comes home. °· Infant car seat. °· Crib. °¨ Do not let your baby sleep in a bed with you or anyone else. °¨ If you do not have a bed for your baby, ask the doctor what you can use that will be safe for the baby to sleep in. °Infant feeding supplies: °· 6 to 8 bottles (8 ounce size). °· 6 to 8 nipples. °· Measuring cup. °· Measuring tablespoon. °· Bottle brush. °· Sterilizer (or use any large pan or kettle with a lid). °· Formula that contains iron. °· A way to boil and cool water. °Breastfeeding supplies: °· Breast pump. °· Nipple cream. °Clothing: °· 24 to 36 cloth diapers and waterproof diaper covers or a box of disposable diapers. You may need as many as 10 to 12 diapers per day. °· 3 onesies (other clothing will depend on the time of year and the weather). °· 3 receiving blankets. °· 3 baby pajamas or gowns. °· 3 bibs. °Bath equipment: °· Mild soap. °· Petroleum jelly. No baby oil or powder. °· Soft cloth towel and washcloth. °· Cotton balls. °· Separate bath basin for baby. Only sponge bathe until umbilical cord and circumcision are healed. °Other supplies: °· Thermometer and bulb syringe (ask the hospital to send them home with you). Ask your doctor about how you should take your baby's temperature. °· One to two pacifiers. °Prepare for an emergency: °· Know how to get to the hospital and know where to admit your baby. °· Put all doctor numbers near your house phone and in your cell phone if you have one. °Prepare your family: °· Talk with siblings about the baby coming home and how they feel about it. °· Decide how you want to handle visitors and other family members. °· Take offers for help with the baby. You will need time to adjust. °Know when to call the  doctor.  °GET HELP RIGHT AWAY IF: °· Your baby's temperature is greater than 100.4°F (38°C). °· The soft spot on your baby's head starts to bulge. °· Your baby is crying with no tears or has no wet diapers for 6 hours. °· Your baby has rapid breathing. °· Your baby is not as alert. °Document Released: 08/28/2008 Document Revised: 01/30/2014 Document Reviewed: 12/05/2010 °ExitCare® Patient Information ©2015 [employer]. This information is not intended to replace advice given to you by your health care provider. Make sure you discuss any questions you have with your health care provider. ° °

## 2014-06-12 NOTE — ED Notes (Signed)
Patient transported to Ultrasound 

## 2014-06-12 NOTE — ED Notes (Signed)
Went to bathroom this am had small brownish red clot. States she is 10 weeks preg. No prenatal care yet.  C/o lower abd. Cramping and back pain G 5 P2 A2

## 2014-06-12 NOTE — ED Provider Notes (Signed)
Medical screening examination/treatment/procedure(s) were performed by non-physician practitioner and as supervising physician I was immediately available for consultation/collaboration.   EKG Interpretation None        Zoriah Pulice N Kash [place], DO 06/12/14 1605 

## 2014-06-12 NOTE — ED Notes (Signed)
Vag bleeding started this am had a small blood clot states she is 10 week preg per LMP G5 P2 A 2 L2. lmp 04/03/14

## 2014-06-12 NOTE — ED Provider Notes (Signed)
CSN: [id number]     Arrival date & time 06/12/14  4098 History   First MD Initiated Contact with Patient 06/12/14 1046     Chief Complaint  Patient presents with  . Vaginal Bleeding     (Consider location/radiation/quality/duration/timing/severity/associated sxs/prior Treatment) HPI Brooke Carrillo is a 28 y.o. female who presents with  Chief Complaint  Patient presents with  . Vaginal Bleeding  Pt states she is about [redacted] weeks pregnant based on LMP (04/03/14), [id number].  Reports hx of 1 miscarriage. Denies hx of ectopic pregnancies.  States for the last 1 week she has had lower back and abdominal pain and cramping that has been constant but waxing and waning, 10/10 at worst, lower abdominal pain is stabbing in nature at this time, 6/10.  States this morning she noticed a small amount of red clotted blood but states she has not noticed any other vaginal discharge or bleeding since then. Pt is concerned she is having another miscarriage or a possible ectopic pregnancy. Pt states she does not currently have an OB/GYN. Denies fever, n/v/d. Denies urinary symptoms. Denies concern for STDs. Denies hx of abdominal surgeries.     Past Medical History  Diagnosis Date  . No pertinent past medical history   . Pregnancy as incidental finding    Past Surgical History  Procedure Laterality Date  . Wisdom tooth extraction    . Orif finger / thumb fracture     Family History  Problem Relation Age of Onset  . Hypertension Father    History  Substance Use Topics  . Smoking status: Current Every Day Smoker -- 0.25 packs/day  . Smokeless tobacco: Not on file  . Alcohol Use: No   OB History   Grav Para Term Preterm Abortions TAB SAB Ect Mult Living   0 0 0 2     Review of Systems  Constitutional: Negative for fever and chills.  Respiratory: Negative for cough and shortness of breath.   Cardiovascular: Negative for chest pain and palpitations.  Gastrointestinal: Positive for abdominal  pain ( lower). Negative for nausea, vomiting and diarrhea.  Genitourinary: Positive for vaginal bleeding, vaginal discharge and pelvic pain. Negative for dysuria, urgency, hematuria and flank pain.  Musculoskeletal: Positive for back pain ( lower). Negative for myalgias.  All other systems reviewed and are negative.     Allergies  Review of patient's allergies indicates no known allergies.  Home Medications   Prior to Admission medications   Medication Sig Start Date End Date Taking? Authorizing Provider  Prenatal Vit-Fe Fumarate-FA (PRENATAL VITAMINS) 28-0.8 MG TABS Take 1 tablet by mouth daily. 06/12/14   Junius Finner, PA-C   BP 111/46  Pulse 67  Temp(Src) 98.6 F (37 C) (Oral)  Resp 16  SpO2 99% Physical Exam  Nursing note and vitals reviewed. Constitutional: She appears well-developed and well-nourished. No distress.  Pt lying comfortably in exam bed, NAD. Non-toxic appearing.   HENT:  Head: Normocephalic and atraumatic.  Eyes: Conjunctivae are normal. No scleral icterus.  Neck: Normal range of motion.  Cardiovascular: Normal rate, regular rhythm and normal heart sounds.   Pulmonary/Chest: Effort normal and breath sounds normal. No respiratory distress. She has no wheezes. She has no rales. She exhibits no tenderness.  Abdominal: Soft. Bowel sounds are normal. She exhibits no distension and no mass. There is tenderness. There is no rebound and no guarding.  Soft, non-distended, mild tenderness in lower abdomen, worse in suprapubic and LLQ. No  rebound or guarding.   Genitourinary:  Chaperoned exam External genitalia: normal Vaginal: positive discharge-mucous/white, small to moderate amount. Negative bleeding Cervix: closed: positive discharge, negative bleeding. Cervical motion absent,  Adnexa palpated: positive left adnexal tenderness, negative adnexal mass  Musculoskeletal: Normal range of motion.  Neurological: She is alert.  Skin: Skin is warm and dry. She is not  diaphoretic.    ED Course  Procedures (including critical care time) Labs Review Labs Reviewed  WET PREP, GENITAL - Abnormal; Notable for the following:    WBC, Wet Prep HPF POC FEW (*)    All other components within normal limits  CBC WITH DIFFERENTIAL - Abnormal; Notable for the following:    WBC 11.5 (*)    RBC 3.80 (*)    HCT 34.8 (*)    Neutro Abs 8.2 (*)    All other components within normal limits  HCG, QUANTITATIVE, PREGNANCY - Abnormal; Notable for the following:    hCG, Beta Chain, Quant, S 16109 (*)    All other components within normal limits  I-STAT CHEM 8, ED - Abnormal; Notable for the following:    Calcium, Ion 1.24 (*)    All other components within normal limits  POC URINE PREG, ED - Abnormal; Notable for the following:    Preg Test, Ur POSITIVE (*)    All other components within normal limits  GC/CHLAMYDIA PROBE AMP  URINALYSIS, ROUTINE W REFLEX MICROSCOPIC  ABO/RH  RH IG WORKUP (INCLUDES ABO/RH)    Imaging Review US Ob Comp Less 14 Wks  06/12/2014   CLINICAL DATA:  Pregnancy.  Vaginal bleeding and cramping.  EXAM: OBSTETRIC <14 WK ULTRASOUND  TECHNIQUE: Transabdominal ultrasound was performed for evaluation of the gestation as well as the maternal uterus and adnexal regions.  COMPARISON:  None.  FINDINGS: Intrauterine gestational sac: Visualized/normal in shape.  Yolk sac:  Present  Embryo:  Present  Cardiac Activity: Present  Heart Rate: 162 bpm  CRL:   34  mm   10 w 2 d                  [place] EDC: 01/05/2014  Maternal uterus/adnexae: Left-sided corpus luteum cyst noted. Adnexa otherwise unremarkable. No free pelvic fluid. No significant subchorionic hemorrhage observed.  IMPRESSION: 1. Single living intrauterine pregnancy measured 10 weeks 2 days gestation, without acute complicating feature observed.   Electronically Signed   By: Herbie Baltimore M.D.   On: 06/12/2014 14:03     EKG Interpretation None      MDM   Final diagnoses:  Vaginal bleeding in  pregnancy, first trimester  [redacted] weeks gestation of pregnancy  Type o blood, rh negative   Pt is a 27yo female G5P2A2L2 presenting to ED believed to be [redacted] weeks pregnant based on LMP, c/o low back pain, and lower abdominal pain, associated with vaginal bleeding x1 red blood clot this morning. Denies fever, n/v/d. Hx of 1 miscarriage. Pt concerned for miscarriage or ectopic.  On exam, pt appears well, non-toxic, afebrile. No vaginal or cervical bleeding on exam. Cervical os closed. Mucous-white vaginal discharge present. Mild left sided adnexal tenderness, no masses palpated.    U/S: single living intrauterine pregnancy measured 10 weeks, 2 days gestation w/o acute complicating feature.   Discussed pt with Dr. Elesa Massed, due to self reporting of vaginal bleeding, will give pt rhogam as pt is O neg.  Discussed results with pt. All questions and concerns addressed. Advised to f/u with an OB/GYN in 2 days for recheck  of hCG to ensure proper growth. Strict return precautions as well as information for [facility] provided.  Rx: prenatal vitamins. Pt verbalized understanding and agreement with tx plan.     Junius Finner, PA-C 06/12/14 1428

## 2014-06-13 LAB — RH IG WORKUP (INCLUDES ABO/RH)
ABO/RH(D): O NEG
Antibody Screen: NEGATIVE
Gestational Age(Wks): 14
Unit division: 0

## 2014-06-13 LAB — ABO/RH: ABO/RH(D): O NEG

## 2014-06-13 LAB — GC/CHLAMYDIA PROBE AMP
CT Probe RNA: NEGATIVE
GC Probe RNA: NEGATIVE

## 2014-07-25 ENCOUNTER — Encounter: Payer: Self-pay | Admitting: Obstetrics

## 2014-07-25 ENCOUNTER — Ambulatory Visit (INDEPENDENT_AMBULATORY_CARE_PROVIDER_SITE_OTHER): Payer: Medicaid Other | Admitting: Obstetrics

## 2014-07-25 VITALS — BP 121/75 | HR 84 | Temp 98.5°F | Wt 171.0 lb

## 2014-07-25 DIAGNOSIS — G44009 Cluster headache syndrome, unspecified, not intractable: Secondary | ICD-10-CM

## 2014-07-25 DIAGNOSIS — Z3482 Encounter for supervision of other normal pregnancy, second trimester: Secondary | ICD-10-CM

## 2014-07-25 LAB — POCT URINALYSIS DIPSTICK
Bilirubin, UA: NEGATIVE
Blood, UA: NEGATIVE
Glucose, UA: NEGATIVE
Ketones, UA: NEGATIVE
Leukocytes, UA: NEGATIVE
Nitrite, UA: NEGATIVE
Protein, UA: NEGATIVE
Spec Grav, UA: 1.02
Urobilinogen, UA: NEGATIVE
pH, UA: 5

## 2014-07-25 LAB — TSH: TSH: 1.341 u[IU]/mL (ref 0.350–4.500)

## 2014-07-25 MED ORDER — BUTALBITAL-APAP-CAFFEINE 50-325-40 MG PO TABS: 2.0000 | ORAL_TABLET | Freq: Four times a day (QID) | ORAL | Status: DC | PRN

## 2014-07-25 NOTE — Progress Notes (Signed)
  Subjective:    Brooke Carrillo is a 28 y.o. female being seen today for her obstetrical visit. She is at 6044w1d gestation. Patient reports: headache.  Problem List Items Addressed This Visit   None    Visit Diagnoses   Encounter for supervision of other normal pregnancy in second trimester    -  Primary    Relevant Orders       Obstetric panel       HIV antibody       Hemoglobinopathy evaluation       Varicella zoster antibody, IgG       TSH       Culture, OB Urine       Vit D  25 hydroxy (rtn osteoporosis monitoring)       POCT urinalysis dipstick       AFP, Quad Screen       US OB Comp + 14 Wk    Cluster headache, not intractable, unspecified chronicity pattern        Relevant Medications       butalbital-acetaminophen-caffeine (FIORICET) tablet 50-325-40 mg       Patient Active Problem List   Diagnosis Date Noted  . Early or threatened labor 02/24/2012    Objective:     BP 121/75  Pulse 84  Temp(Src) 98.5 F (36.9 C)  Wt 171 lb (77.565 kg)  LMP 04/03/2014 Uterine Size: Below umbilicus     Assessment:    Pregnancy @ 9244w1d  weeks Doing well    Plan:    Problem list reviewed and updated. Labs reviewed.  Follow up in 4 weeks. FIRST/CF mutation testing/NIPT/QUAD SCREEN/fragile X/Ashkenazi Jewish population testing/Spinal muscular atrophy discussed: requested. Role of ultrasound in pregnancy discussed; fetal survey: requested. Amniocentesis discussed: not indicated.

## 2014-07-26 LAB — AFP, QUAD SCREEN
AFP: 39.3 ng/mL
Age Alone: 1:878 {titer}
Curr Gest Age: 16.3 wks.days
Down Syndrome Scr Risk Est: 1:14200 {titer}
HCG, Total: 37.73 IU/mL
INH: 270 pg/mL
Interpretation-AFP: NEGATIVE
MoM for AFP: 1.49
MoM for INH: 1.75
MoM for hCG: 1.12
Open Spina bifida: NEGATIVE
Osb Risk: 1:824 {titer}
Tri 18 Scr Risk Est: NEGATIVE
Trisomy 18 (Edward) Syndrome Interp.: 1:89300 {titer}
uE3 Mom: 0.94
uE3 Value: 0.8 ng/mL

## 2014-07-26 LAB — VARICELLA ZOSTER ANTIBODY, IGG: Varicella IgG: 675.2 Index — ABNORMAL HIGH (ref <135.00)

## 2014-07-26 LAB — HIV ANTIBODY (ROUTINE TESTING W REFLEX): HIV 1&2 Ab, 4th Generation: NONREACTIVE

## 2014-07-26 LAB — VITAMIN D 25 HYDROXY (VIT D DEFICIENCY, FRACTURES): Vit D, 25-Hydroxy: 44 ng/mL (ref 30–89)

## 2014-07-27 LAB — OBSTETRIC PANEL
Antibody Screen: POSITIVE — AB
Basophils Absolute: 0 10*3/uL (ref 0.0–0.1)
Basophils Relative: 0 % (ref 0–1)
Eosinophils Absolute: 0.3 10*3/uL (ref 0.0–0.7)
Eosinophils Relative: 2 % (ref 0–5)
HCT: 33.9 % — ABNORMAL LOW (ref 36.0–46.0)
Hemoglobin: 11.8 g/dL — ABNORMAL LOW (ref 12.0–15.0)
Hepatitis B Surface Ag: NEGATIVE
Lymphocytes Relative: 22 % (ref 12–46)
Lymphs Abs: 2.9 10*3/uL (ref 0.7–4.0)
MCH: 32.2 pg (ref 26.0–34.0)
MCHC: 34.8 g/dL (ref 30.0–36.0)
MCV: 92.4 fL (ref 78.0–100.0)
Monocytes Absolute: 0.5 10*3/uL (ref 0.1–1.0)
Monocytes Relative: 4 % (ref 3–12)
Neutro Abs: 9.5 10*3/uL — ABNORMAL HIGH (ref 1.7–7.7)
Neutrophils Relative %: 72 % (ref 43–77)
Platelets: 336 10*3/uL (ref 150–400)
RBC: 3.67 MIL/uL — ABNORMAL LOW (ref 3.87–5.11)
RDW: 12.3 % (ref 11.5–15.5)
Rh Type: NEGATIVE
Rubella: 2.25 Index — ABNORMAL HIGH (ref <0.90)
WBC: 13.2 10*3/uL — ABNORMAL HIGH (ref 4.0–10.5)

## 2014-07-27 LAB — HEMOGLOBINOPATHY EVALUATION
Hemoglobin Other: 0 %
Hgb A2 Quant: 2.4 % (ref 2.2–3.2)
Hgb A: 97.6 % (ref 96.8–97.8)
Hgb F Quant: 0 % (ref 0.0–2.0)
Hgb S Quant: 0 %

## 2014-07-27 LAB — CULTURE, OB URINE
Colony Count: NO GROWTH
Organism ID, Bacteria: NO GROWTH

## 2014-07-27 LAB — ANTIBODY TITER (PRENATAL TITER): Ab Titer: <2

## 2014-07-27 LAB — PRENATAL ANTIBODY IDENTIFICATION

## 2014-07-31 ENCOUNTER — Encounter: Payer: Self-pay | Admitting: Obstetrics

## 2014-08-15 ENCOUNTER — Ambulatory Visit (HOSPITAL_COMMUNITY)
Admission: RE | Admit: 2014-08-15 | Discharge: 2014-08-15 | Disposition: A | Discharge status: Home / Self Care | Payer: Medicaid Other | Source: Ambulatory Visit | Attending: Obstetrics | Admitting: Obstetrics

## 2014-08-15 DIAGNOSIS — Z36 Encounter for antenatal screening of mother: Secondary | ICD-10-CM | POA: Insufficient documentation

## 2014-08-15 DIAGNOSIS — Z3A19 19 weeks gestation of pregnancy: Secondary | ICD-10-CM | POA: Insufficient documentation

## 2014-08-15 DIAGNOSIS — Z1389 Encounter for screening for other disorder: Secondary | ICD-10-CM | POA: Insufficient documentation

## 2014-08-15 DIAGNOSIS — Z3482 Encounter for supervision of other normal pregnancy, second trimester: Secondary | ICD-10-CM

## 2014-08-22 ENCOUNTER — Encounter: Payer: Self-pay | Admitting: Obstetrics

## 2014-08-22 ENCOUNTER — Ambulatory Visit (INDEPENDENT_AMBULATORY_CARE_PROVIDER_SITE_OTHER): Payer: Medicaid Other | Admitting: Obstetrics

## 2014-08-22 VITALS — BP 130/79 | HR 119 | Temp 98.1°F | Wt 174.0 lb

## 2014-08-22 DIAGNOSIS — Z3482 Encounter for supervision of other normal pregnancy, second trimester: Secondary | ICD-10-CM

## 2014-08-22 DIAGNOSIS — Z23 Encounter for immunization: Secondary | ICD-10-CM

## 2014-08-22 LAB — POCT URINALYSIS DIPSTICK
Bilirubin, UA: NEGATIVE
Blood, UA: NEGATIVE
Glucose, UA: NEGATIVE
Ketones, UA: NEGATIVE
Leukocytes, UA: NEGATIVE
Nitrite, UA: NEGATIVE
Spec Grav, UA: 1.015
Urobilinogen, UA: NEGATIVE
pH, UA: 6

## 2014-08-22 NOTE — Addendum Note (Signed)
Addended by: Henriette CombsHATTON, Carri Spillers L on: 08/22/2014 11:22 AM   Modules accepted: Orders, SmartSet

## 2014-08-22 NOTE — Progress Notes (Signed)
Subjective:    Brooke Carrillo is a 28 y.o. female being seen today for her obstetrical visit. She is at 7335w1d gestation. Patient reports: no complaints . Fetal movement: normal.  Problem List Items Addressed This Visit    None    Visit Diagnoses    Encounter for supervision of other normal pregnancy in second trimester    -  Primary    Relevant Orders       POCT urinalysis dipstick      Patient Active Problem List   Diagnosis Date Noted  . Encounter for routine screening for malformation using ultrasonics   . [redacted] weeks gestation of pregnancy   . Early or threatened labor 02/24/2012   Objective:    BP 130/79 mmHg  Pulse 119  Temp(Src) 98.1 F (36.7 C)  Wt 174 lb (78.926 kg)  LMP 04/03/2014 FHT: 150 BPM  Uterine Size: size equals dates     Assessment:    Pregnancy @ 3835w1d    Plan:    OBGCT: ordered for next visit.  Labs, problem list reviewed and updated 2 hr GTT planned Follow up in 4 weeks.

## 2014-09-19 ENCOUNTER — Other Ambulatory Visit: Payer: Medicaid Other

## 2014-09-19 ENCOUNTER — Ambulatory Visit (INDEPENDENT_AMBULATORY_CARE_PROVIDER_SITE_OTHER): Payer: Medicaid Other | Admitting: Obstetrics

## 2014-09-19 ENCOUNTER — Encounter: Payer: Self-pay | Admitting: Obstetrics

## 2014-09-19 VITALS — BP 111/73 | HR 91 | Temp 97.9°F | Wt 181.0 lb

## 2014-09-19 DIAGNOSIS — Z3482 Encounter for supervision of other normal pregnancy, second trimester: Secondary | ICD-10-CM

## 2014-09-19 DIAGNOSIS — K219 Gastro-esophageal reflux disease without esophagitis: Secondary | ICD-10-CM

## 2014-09-19 DIAGNOSIS — Z3490 Encounter for supervision of normal pregnancy, unspecified, unspecified trimester: Secondary | ICD-10-CM

## 2014-09-19 LAB — POCT URINALYSIS DIPSTICK
Bilirubin, UA: NEGATIVE
Blood, UA: NEGATIVE
Glucose, UA: NEGATIVE
Ketones, UA: NEGATIVE
Leukocytes, UA: NEGATIVE
Nitrite, UA: NEGATIVE
Protein, UA: NEGATIVE
Spec Grav, UA: 1.015
Urobilinogen, UA: NEGATIVE
pH, UA: 6

## 2014-09-19 MED ORDER — OMEPRAZOLE 20 MG PO CPDR: 20.0000 mg | DELAYED_RELEASE_CAPSULE | Freq: Two times a day (BID) | ORAL | Status: DC

## 2014-09-19 NOTE — Addendum Note (Signed)
Addended by: Odessa FlemingBOHNE, Maliyah Willets M on: 09/19/2014 02:03 PM   Modules accepted: Orders

## 2014-09-19 NOTE — Progress Notes (Signed)
Subjective:    Brooke Carrillo is a 28 y.o. female being seen today for her obstetrical visit. She is at 6977w1d gestation. Patient reports: no complaints . Fetal movement: normal.  Problem List Items Addressed This Visit    None    Visit Diagnoses    Encounter for supervision of other normal pregnancy in second trimester    -  Primary    Relevant Orders       POCT urinalysis dipstick (Completed)    GERD without esophagitis        Relevant Medications       omeprazole (PRILOSEC) capsule      Patient Active Problem List   Diagnosis Date Noted  . Encounter for routine screening for malformation using ultrasonics   . [redacted] weeks gestation of pregnancy   . Early or threatened labor 02/24/2012   Objective:    BP 111/73 mmHg  Pulse 91  Temp(Src) 97.9 F (36.6 C)  Wt 181 lb (82.101 kg)  LMP 04/03/2014 FHT: 150 BPM  Uterine Size: size equals dates     Assessment:    Pregnancy @ 4577w1d    Plan:    OBGCT: ordered.  Labs, problem list reviewed and updated 2 hr GTT planned Follow up in 2 weeks.

## 2014-09-20 LAB — CBC
HCT: 31.5 % — ABNORMAL LOW (ref 36.0–46.0)
Hemoglobin: 10.6 g/dL — ABNORMAL LOW (ref 12.0–15.0)
MCH: 31.7 pg (ref 26.0–34.0)
MCHC: 33.7 g/dL (ref 30.0–36.0)
MCV: 94.3 fL (ref 78.0–100.0)
MPV: 9.8 fL (ref 9.4–12.4)
Platelets: 346 10*3/uL (ref 150–400)
RBC: 3.34 MIL/uL — ABNORMAL LOW (ref 3.87–5.11)
RDW: 12.9 % (ref 11.5–15.5)
WBC: 16 10*3/uL — ABNORMAL HIGH (ref 4.0–10.5)

## 2014-09-20 LAB — GLUCOSE TOLERANCE, 2 HOURS W/ 1HR
Glucose, 1 hour: 41 mg/dL — CL (ref 70–170)
Glucose, 2 hour: 58 mg/dL — ABNORMAL LOW (ref 70–139)
Glucose, Fasting: 62 mg/dL — ABNORMAL LOW (ref 70–99)

## 2014-09-20 LAB — HIV ANTIBODY (ROUTINE TESTING W REFLEX): HIV 1&2 Ab, 4th Generation: NONREACTIVE

## 2014-09-20 LAB — RPR

## 2014-09-28 ENCOUNTER — Encounter (HOSPITAL_COMMUNITY): Payer: Self-pay | Admitting: *Deleted

## 2014-09-28 ENCOUNTER — Inpatient Hospital Stay (HOSPITAL_COMMUNITY)
Admission: AD | Admit: 2014-09-28 | Discharge: 2014-09-28 | Disposition: A | Discharge status: Home / Self Care | Payer: Medicaid Other | Source: Ambulatory Visit | Attending: Obstetrics | Admitting: Obstetrics

## 2014-09-28 DIAGNOSIS — Z3A25 25 weeks gestation of pregnancy: Secondary | ICD-10-CM | POA: Insufficient documentation

## 2014-09-28 DIAGNOSIS — R1011 Right upper quadrant pain: Secondary | ICD-10-CM | POA: Insufficient documentation

## 2014-09-28 DIAGNOSIS — O99332 Smoking (tobacco) complicating pregnancy, second trimester: Secondary | ICD-10-CM | POA: Diagnosis not present

## 2014-09-28 DIAGNOSIS — O9989 Other specified diseases and conditions complicating pregnancy, childbirth and the puerperium: Secondary | ICD-10-CM | POA: Diagnosis not present

## 2014-09-28 LAB — COMPREHENSIVE METABOLIC PANEL
ALT: 6 U/L (ref 0–35)
AST: 13 U/L (ref 0–37)
Albumin: 3.1 g/dL — ABNORMAL LOW (ref 3.5–5.2)
Alkaline Phosphatase: 72 U/L (ref 39–117)
Anion gap: 7 (ref 5–15)
BUN: 8 mg/dL (ref 6–23)
CO2: 22 mmol/L (ref 19–32)
Calcium: 9.9 mg/dL (ref 8.4–10.5)
Chloride: 108 mEq/L (ref 96–112)
Creatinine, Ser: 0.44 mg/dL — ABNORMAL LOW (ref 0.50–1.10)
GFR calc Af Amer: >90 mL/min (ref >90)
GFR calc non Af Amer: >90 mL/min (ref >90)
Glucose, Bld: 98 mg/dL (ref 70–99)
Potassium: 3.7 mmol/L (ref 3.5–5.1)
Sodium: 137 mmol/L (ref 135–145)
Total Bilirubin: 0.1 mg/dL — ABNORMAL LOW (ref 0.3–1.2)
Total Protein: 6.5 g/dL (ref 6.0–8.3)

## 2014-09-28 LAB — URINALYSIS, ROUTINE W REFLEX MICROSCOPIC
Bilirubin Urine: NEGATIVE
Glucose, UA: NEGATIVE mg/dL
Hgb urine dipstick: NEGATIVE
Ketones, ur: NEGATIVE mg/dL
Leukocytes, UA: NEGATIVE
Nitrite: NEGATIVE
Protein, ur: NEGATIVE mg/dL
Specific Gravity, Urine: 1.02 (ref 1.005–1.030)
Urobilinogen, UA: 0.2 mg/dL (ref 0.0–1.0)
pH: 6 (ref 5.0–8.0)

## 2014-09-28 LAB — CBC
HCT: 30.1 % — ABNORMAL LOW (ref 36.0–46.0)
Hemoglobin: 10.5 g/dL — ABNORMAL LOW (ref 12.0–15.0)
MCH: 32.7 pg (ref 26.0–34.0)
MCHC: 34.9 g/dL (ref 30.0–36.0)
MCV: 93.8 fL (ref 78.0–100.0)
Platelets: 301 10*3/uL (ref 150–400)
RBC: 3.21 MIL/uL — ABNORMAL LOW (ref 3.87–5.11)
RDW: 12.6 % (ref 11.5–15.5)
WBC: 17.7 10*3/uL — ABNORMAL HIGH (ref 4.0–10.5)

## 2014-09-28 LAB — LIPASE, BLOOD: Lipase: 26 U/L (ref 11–59)

## 2014-09-28 LAB — AMYLASE: Amylase: 57 U/L (ref 0–105)

## 2014-09-28 MED ORDER — GI COCKTAIL ~~LOC~~
30.0000 mL | Freq: Once | ORAL | Status: AC
  Administered 2014-09-28: 30 mL via ORAL
  Filled 2014-09-28: qty 30

## 2014-09-28 MED ORDER — HYDROCODONE-ACETAMINOPHEN 5-325 MG PO TABS: 1.0000 | ORAL_TABLET | ORAL | Status: DC | PRN

## 2014-09-28 NOTE — MAU Note (Addendum)
Right upper quadrant pain since yesterday, was intermittent, now constant. No appetite, but denies N/V/D. States she has been constipated.

## 2014-09-28 NOTE — Discharge Instructions (Signed)
Cholelithiasis °Cholelithiasis (also called gallstones) is a form of gallbladder disease in which gallstones form in your gallbladder. The gallbladder is an organ that stores bile made in the liver, which helps digest fats. Gallstones begin as small crystals and slowly grow into stones. Gallstone pain occurs when the gallbladder spasms and a gallstone is blocking the duct. Pain can also occur when a stone passes out of the duct.  °RISK FACTORS °· Being female.   °· Having multiple pregnancies. Health care providers sometimes advise removing diseased gallbladders before future pregnancies.   °· Being obese. °· Eating a diet heavy in fried foods and fat.   °· Being older than 60 years and increasing age.   °· Prolonged use of medicines containing female hormones.   °· Having diabetes mellitus.   °· Rapidly losing weight.   °· Having a family history of gallstones (heredity).   °SYMPTOMS °· Nausea.   °· Vomiting. °· Abdominal pain.   °· Yellowing of the skin (jaundice).   °· Sudden pain. It may persist from several minutes to several hours. °· Fever.   °· Tenderness to the touch.  °In some cases, when gallstones do not move into the bile duct, people have no pain or symptoms. These are called "silent" gallstones.  °TREATMENT °Silent gallstones do not need treatment. In severe cases, emergency surgery may be required. Options for treatment include: °· Surgery to remove the gallbladder. This is the most common treatment. °· Medicines. These do not always work and may take 6-12 months or more to work. °· Shock wave treatment (extracorporeal biliary lithotripsy). In this treatment an ultrasound machine sends shock waves to the gallbladder to break gallstones into smaller pieces that can pass into the intestines or be dissolved by medicine. °HOME CARE INSTRUCTIONS  °· Only take over-the-counter or prescription medicines for pain, discomfort, or fever as directed by your health care provider.   °· Follow a low-fat diet until  seen again by your health care provider. Fat causes the gallbladder to contract, which can result in pain.   °· Follow up with your health care provider as directed. Attacks are almost always recurrent and surgery is usually required for permanent treatment.   °SEEK IMMEDIATE MEDICAL CARE IF:  °· Your pain increases and is not controlled by medicines.   °· You have a fever or persistent symptoms for more than 2-3 days.   °· You have a fever and your symptoms suddenly get worse.   °· You have persistent nausea and vomiting.   °MAKE SURE YOU:  °· Understand these instructions. °· Will watch your condition. °· Will get help right away if you are not doing well or get worse. °Document Released: 09/11/2005 Document Revised: 05/18/2013 Document Reviewed: 03/09/2013 °Exit[employer]® Patient Information ©2015 [employer]. This information is not intended to replace advice given to you by your health care provider. Make sure you discuss any questions you have with your health care provider. ° °

## 2014-09-28 NOTE — MAU Provider Note (Signed)
History     CSN: [id number]  Arrival date and time: 09/28/14 1423   First Provider Initiated Contact with Patient 09/28/14 1457      Chief Complaint  Patient presents with  . Abdominal Pain   HPI  Mckynzie Sherene SiresM Kallman is a 28 y.o. [id number] at 7459w3d who presents today with 8/10 RUQ pain. She states that the pain started last night after eating yogurt. She states that she also also had chicken nuggets and a salad as well. She has never had pain like this before. She tried taking tylenol this afternoon, but it did not help. She denies any fever or nausea/vomiting. She states that she last ate at 1300 today. She denies any contractions, LOF or vaginal bleeding. She states that the fetus has been moving normally.   Past Medical History  Diagnosis Date  . No pertinent past medical history   . Pregnancy as incidental finding   . Medical history non-contributory     Past Surgical History  Procedure Laterality Date  . Wisdom tooth extraction    . Orif finger / thumb fracture      Family History  Problem Relation Age of Onset  . Hypertension Father   . Dementia Mother     History  Substance Use Topics  . Smoking status: Current Every Day Smoker -- 0.50 packs/day  . Smokeless tobacco: Never Used  . Alcohol Use: No    Allergies: No Known Allergies  Prescriptions prior to admission  Medication Sig Dispense Refill Last Dose  . acetaminophen (TYLENOL) 500 MG tablet Take 500 mg by mouth every 6 (six) hours as needed for mild pain.   09/28/2014 at Unknown time  . omeprazole (PRILOSEC) 20 MG capsule Take 1 capsule (20 mg total) by mouth 2 (two) times daily before a meal. 60 capsule 5 09/28/2014 at Unknown time  . butalbital-acetaminophen-caffeine (FIORICET) 50-325-40 MG per tablet Take 2 tablets by mouth every 6 (six) hours as needed for headache. (Patient not taking: Reported on 09/19/2014) 40 tablet 2 Not Taking  . Prenatal Vit-Fe Fumarate-FA (PRENATAL VITAMINS) 28-0.8 MG TABS Take 1  tablet by mouth daily. (Patient not taking: Reported on 09/19/2014) 30 tablet 0 Not Taking    ROS Physical Exam   Blood pressure 100/68, pulse 108, temperature 98.3 F (36.8 C), temperature source Oral, resp. rate 20, height 5\' 2"  (1.575 m), weight 82.555 kg (182 lb), last menstrual period 04/03/2014.  Physical Exam  Nursing note and vitals reviewed. Constitutional: She is oriented to person, place, and time. She appears well-developed and well-nourished. No distress.  Cardiovascular: Normal rate.   Respiratory: Effort normal.  GI: Soft. There is tenderness (in the RUQ ).  Neurological: She is alert and oriented to person, place, and time.  Skin: Skin is warm and dry.  Psychiatric: She has a normal mood and affect.  FHT: 150, moderate with 15x15 accels, no decels Toco no UCs   MAU Course  Procedures  1642: Patient reports some improvement in her pain with GI cocktail.  Results for orders placed or performed during the hospital encounter of 09/28/14 (from the past 24 hour(s))  Urinalysis, Routine w reflex microscopic     Status: Abnormal   Collection Time: 09/28/14  2:35 PM  Result Value Ref Range   Color, Urine YELLOW YELLOW   APPearance CLOUDY (A) CLEAR   Specific Gravity, Urine 1.020 1.005 - 1.030   pH 6.0 5.0 - 8.0   Glucose, UA NEGATIVE NEGATIVE mg/dL   Hgb urine dipstick  NEGATIVE NEGATIVE   Bilirubin Urine NEGATIVE NEGATIVE   Ketones, ur NEGATIVE NEGATIVE mg/dL   Protein, ur NEGATIVE NEGATIVE mg/dL   Urobilinogen, UA 0.2 0.0 - 1.0 mg/dL   Nitrite NEGATIVE NEGATIVE   Leukocytes, UA NEGATIVE NEGATIVE  CBC     Status: Abnormal   Collection Time: 09/28/14  2:44 PM  Result Value Ref Range   WBC 17.7 (H) 4.0 - 10.5 K/uL   RBC 3.21 (L) 3.87 - 5.11 MIL/uL   Hemoglobin 10.5 (L) 12.0 - 15.0 g/dL   HCT 81.130.1 (L) 91.436.0 - 78.246.0 %   MCV 93.8 78.0 - 100.0 fL   MCH 32.7 26.0 - 34.0 pg   MCHC 34.9 30.0 - 36.0 g/dL   RDW 95.612.6 21.311.5 - 08.615.5 %   Platelets 301 150 - 400 K/uL   Comprehensive metabolic panel     Status: Abnormal   Collection Time: 09/28/14  2:44 PM  Result Value Ref Range   Sodium 137 135 - 145 mmol/L   Potassium 3.7 3.5 - 5.1 mmol/L   Chloride 108 96 - 112 mEq/L   CO2 22 19 - 32 mmol/L   Glucose, Bld 98 70 - 99 mg/dL   BUN 8 6 - 23 mg/dL   Creatinine, Ser 5.780.44 (L) 0.50 - 1.10 mg/dL   Calcium 9.9 8.4 - 46.910.5 mg/dL   Total Protein 6.5 6.0 - 8.3 g/dL   Albumin 3.1 (L) 3.5 - 5.2 g/dL   AST 13 0 - 37 U/L   ALT 6 0 - 35 U/L   Alkaline Phosphatase 72 39 - 117 U/L   Total Bilirubin 0.1 (L) 0.3 - 1.2 mg/dL   GFR calc non Af Amer >90 >90 mL/min   GFR calc Af Amer >90 >90 mL/min   Anion gap 7 5 - 15     Assessment and Plan   1. RUQ abdominal pain    Will get abdominal US outpatient so that patient can be NPO prior to US Return to MAU as needed  Follow-up Information    Follow up with HARPER,CHARLES A, MD.   Specialty:  Obstetrics and Gynecology   Why:  As scheduled, Ultrasound willl call with an appointment. Do not eat or drink for at least 8 hours prior to your ultrasound.    Contact information:   [address] [place] [id number] [phone]        Tawnya CrookHogan, Heather Donovan 09/28/2014, 2:58 PM

## 2014-09-29 NOTE — L&D Delivery Note (Signed)
This is a 29 year old G 6 P8 who was admitted for Not in labor.. She progressed normally with pitocin to the second stage of labor.  She pushed for 10 min.  She delivered a viable infant female, cephalic, over an intact perineum.  A nuchal cord  Identified was not.  Infant placed on maternal abdomen.  Delayed cord clamping for 90 seconds, cord double clamped and cut.  Apgar scores were 9 and 9. The placenta delivered spontaneously, shultz, with a 3 vessel cord.  Inspection revealed none. The uterus was firm bleeding stable.  EBL was 250.    Placenta and umbilical artery blood gas were not sent.  There were no complications during the procedure.  Mom and baby skin to skin following delivery. Left in stable condition.  Delivery Note At 4:04 PM a viable female was delivered via Vaginal, Spontaneous Delivery (Presentation: ; Occiput Anterior).  APGAR: 9, 9; weight  .   Placenta status: Intact, Spontaneous.  Cord: 3 vessels with the following complications: None.  Cord pH: N/A  Anesthesia: Epidural  Episiotomy: None Lacerations: None Suture Repair: none Est. Blood Loss (mL): 250  Mom to postpartum.  Baby to Couplet care / Skin to Skin.  Orvilla CornwallDenney, Rachelle A 01/03/2015, 5:01 PM    Orvilla Cornwallachelle Denney CNM

## 2014-10-03 ENCOUNTER — Ambulatory Visit (INDEPENDENT_AMBULATORY_CARE_PROVIDER_SITE_OTHER): Payer: Medicaid Other | Admitting: Obstetrics

## 2014-10-03 VITALS — BP 121/72 | HR 97 | Temp 98.1°F | Wt 181.0 lb

## 2014-10-03 DIAGNOSIS — Z3483 Encounter for supervision of other normal pregnancy, third trimester: Secondary | ICD-10-CM

## 2014-10-03 LAB — POCT URINALYSIS DIPSTICK
Bilirubin, UA: NEGATIVE
Blood, UA: NEGATIVE
Glucose, UA: NEGATIVE
Ketones, UA: NEGATIVE
Nitrite, UA: NEGATIVE
Spec Grav, UA: 1.015
Urobilinogen, UA: NEGATIVE
pH, UA: 6

## 2014-10-04 ENCOUNTER — Encounter: Payer: Self-pay | Admitting: Obstetrics

## 2014-10-04 NOTE — Progress Notes (Signed)
Subjective:    Brooke Carrillo is a 29 y.o. female being seen today for her obstetrical visit. She is at 7311w2d gestation. Patient reports: no complaints . Fetal movement: normal.  Problem List Items Addressed This Visit    None    Visit Diagnoses    Encounter for supervision of other normal pregnancy in second trimester    -  Primary    Relevant Orders       POCT urinalysis dipstick (Completed)      Patient Active Problem List   Diagnosis Date Noted  . Encounter for routine screening for malformation using ultrasonics   . [redacted] weeks gestation of pregnancy   . Early or threatened labor 02/24/2012   Objective:    BP 121/72 mmHg  Pulse 97  Temp(Src) 98.1 F (36.7 C)  Wt 181 lb (82.101 kg)  LMP 04/03/2014 FHT: 150 BPM  Uterine Size: size equals dates     Assessment:    Pregnancy @ 6911w2d    Plan:    OBGCT: discussed.  Labs, problem list reviewed and updated 2 hr GTT planned Follow up in 2 weeks.

## 2014-10-05 ENCOUNTER — Telehealth: Payer: Self-pay

## 2014-10-05 ENCOUNTER — Ambulatory Visit (HOSPITAL_COMMUNITY)
Admission: RE | Admit: 2014-10-05 | Discharge: 2014-10-05 | Disposition: A | Discharge status: Home / Self Care | Payer: Medicaid Other | Source: Ambulatory Visit | Attending: Advanced Practice Midwife | Admitting: Advanced Practice Midwife

## 2014-10-05 DIAGNOSIS — R1011 Right upper quadrant pain: Secondary | ICD-10-CM | POA: Diagnosis not present

## 2014-10-05 NOTE — Telephone Encounter (Signed)
Called patient and informed her of normal U/S. Patient verbalized understanding. No questions or concerns.

## 2014-10-05 NOTE — Telephone Encounter (Signed)
-----   [facility] from [facility] T[facility] sent at 10/05/2014 11:26 AM EST ----- Regarding: U/S Abdomen results We have just completed an abdominal outpatient ultrasound scheduled for a patient who was seen in MAU.  Please call the patient with the results.

## 2014-10-10 ENCOUNTER — Ambulatory Visit (INDEPENDENT_AMBULATORY_CARE_PROVIDER_SITE_OTHER): Payer: Medicaid Other | Admitting: Obstetrics

## 2014-10-10 VITALS — BP 127/75 | HR 106 | Temp 97.1°F | Wt 185.0 lb

## 2014-10-10 DIAGNOSIS — Z3483 Encounter for supervision of other normal pregnancy, third trimester: Secondary | ICD-10-CM

## 2014-10-10 DIAGNOSIS — N9489 Other specified conditions associated with female genital organs and menstrual cycle: Secondary | ICD-10-CM

## 2014-10-10 LAB — POCT URINALYSIS DIPSTICK
Bilirubin, UA: NEGATIVE
Blood, UA: NEGATIVE
Glucose, UA: NEGATIVE
Ketones, UA: NEGATIVE
Leukocytes, UA: NEGATIVE
Nitrite, UA: NEGATIVE
Protein, UA: NEGATIVE
Spec Grav, UA: 1.015
Urobilinogen, UA: NEGATIVE
pH, UA: 7

## 2014-10-10 MED ORDER — OB COMPLETE PETITE 35-5-1-200 MG PO CAPS: 1.0000 | ORAL_CAPSULE | Freq: Every day | ORAL | Status: DC

## 2014-10-11 ENCOUNTER — Encounter: Payer: Self-pay | Admitting: Obstetrics

## 2014-10-11 NOTE — Progress Notes (Signed)
Subjective:    Brooke Carrillo is a 29 y.o. female being seen today for her obstetrical visit. She is at 6045w2d gestation. Patient reports: no complaints . Fetal movement: normal.  Problem List Items Addressed This Visit    None    Visit Diagnoses    Encounter for supervision of other normal pregnancy in second trimester    -  Primary    Relevant Medications    Prenat-FeCbn-FeAspGl-FA-Omega (OB COMPLETE PETITE) 35-5-1-200 MG CAPS    Other Relevant Orders    POCT urinalysis dipstick (Completed)      Patient Active Problem List   Diagnosis Date Noted  . Encounter for routine screening for malformation using ultrasonics   . [redacted] weeks gestation of pregnancy   . Early or threatened labor 02/24/2012   Objective:    BP 127/75 mmHg  Pulse 106  Temp(Src) 97.1 F (36.2 C)  Wt 185 lb (83.915 kg)  LMP 04/03/2014 FHT: 160 BPM  Uterine Size: size equals dates     Assessment:    Pregnancy @ 4345w2d    Plan:    OBGCT: discussed.  Labs, problem list reviewed and updated 2 hr GTT planned Follow up in 2 weeks.

## 2014-10-19 ENCOUNTER — Other Ambulatory Visit: Payer: Self-pay | Admitting: *Deleted

## 2014-10-19 ENCOUNTER — Ambulatory Visit (INDEPENDENT_AMBULATORY_CARE_PROVIDER_SITE_OTHER): Payer: Medicaid Other | Admitting: Obstetrics

## 2014-10-19 VITALS — BP 126/70 | HR 102 | Temp 97.8°F | Wt 187.0 lb

## 2014-10-19 DIAGNOSIS — Z3483 Encounter for supervision of other normal pregnancy, third trimester: Secondary | ICD-10-CM

## 2014-10-19 DIAGNOSIS — B373 Candidiasis of vulva and vagina: Secondary | ICD-10-CM

## 2014-10-19 DIAGNOSIS — O360121 Maternal care for anti-D [Rh] antibodies, second trimester, fetus 1: Secondary | ICD-10-CM

## 2014-10-19 DIAGNOSIS — B3731 Acute candidiasis of vulva and vagina: Secondary | ICD-10-CM

## 2014-10-19 LAB — POCT URINALYSIS DIPSTICK
Bilirubin, UA: NEGATIVE
Blood, UA: NEGATIVE
Glucose, UA: NEGATIVE
Ketones, UA: NEGATIVE
Nitrite, UA: NEGATIVE
Spec Grav, UA: 1.01
Urobilinogen, UA: NEGATIVE
pH, UA: 7

## 2014-10-19 MED ORDER — RHO D IMMUNE GLOBULIN 1500 UNIT/2ML IJ SOSY
300.0000 ug | PREFILLED_SYRINGE | Freq: Once | INTRAMUSCULAR | Status: AC
  Administered 2014-10-19: 300 ug via INTRAMUSCULAR

## 2014-10-19 MED ORDER — FLUCONAZOLE 150 MG PO TABS: 150.0000 mg | ORAL_TABLET | ORAL | Status: DC

## 2014-10-20 ENCOUNTER — Encounter: Payer: Self-pay | Admitting: Obstetrics

## 2014-10-20 NOTE — Progress Notes (Signed)
Subjective:    Chamille Sherene SiresM Rabadi is a 29 y.o. female being seen today for her obstetrical visit. She is at 2064w4d gestation. Patient reports no complaints. Fetal movement: normal.  Problem List Items Addressed This Visit    None    Visit Diagnoses    Encounter for supervision of other normal pregnancy in second trimester    -  Primary    Relevant Orders    POCT urinalysis dipstick (Completed)    Rh negative state in antepartum period, second trimester, fetus 1        Relevant Medications    rho (d) immune globulin (RHIG/RHOPHYLAC) injection 300 mcg (Completed)      Patient Active Problem List   Diagnosis Date Noted  . Encounter for routine screening for malformation using ultrasonics   . [redacted] weeks gestation of pregnancy   . Early or threatened labor 02/24/2012   Objective:    BP 126/70 mmHg  Pulse 102  Temp(Src) 97.8 F (36.6 C)  Wt 187 lb (84.823 kg)  LMP 04/03/2014 FHT:  160 BPM  Uterine Size: size equals dates  Presentation: unsure     Assessment:    Pregnancy @ 4164w4d weeks   Plan:     labs reviewed, problem list updated Consent signed. GBS sent TDAP offered  Rhogam given for RH negative Pediatrician: discussed. Infant feeding: plans to breastfeed. Maternity leave: not discussed. Cigarette smoking: smokes 0.5 PPD. Orders Placed This Encounter  Procedures  . POCT urinalysis dipstick   Meds ordered this encounter  Medications  . rho (d) immune globulin (RHIG/RHOPHYLAC) injection 300 mcg    Sig:    Follow up in 2 Weeks.

## 2014-10-26 ENCOUNTER — Ambulatory Visit (INDEPENDENT_AMBULATORY_CARE_PROVIDER_SITE_OTHER): Payer: Medicaid Other | Admitting: Obstetrics

## 2014-10-26 VITALS — BP 121/71 | HR 98 | Temp 97.6°F | Wt 189.0 lb

## 2014-10-26 DIAGNOSIS — F419 Anxiety disorder, unspecified: Secondary | ICD-10-CM

## 2014-10-26 DIAGNOSIS — Z3483 Encounter for supervision of other normal pregnancy, third trimester: Secondary | ICD-10-CM

## 2014-10-26 LAB — POCT URINALYSIS DIPSTICK
Bilirubin, UA: NEGATIVE
Blood, UA: NEGATIVE
Glucose, UA: NEGATIVE
Ketones, UA: NEGATIVE
Leukocytes, UA: NEGATIVE
Nitrite, UA: NEGATIVE
Spec Grav, UA: 1.015
Urobilinogen, UA: NEGATIVE
pH, UA: 6.5

## 2014-10-26 MED ORDER — HYDROXYZINE PAMOATE 50 MG PO CAPS: 50.0000 mg | ORAL_CAPSULE | Freq: Three times a day (TID) | ORAL | Status: DC | PRN

## 2014-10-27 ENCOUNTER — Encounter: Payer: Self-pay | Admitting: Obstetrics

## 2014-10-27 NOTE — Progress Notes (Signed)
Subjective:    Brooke Carrillo is a 29 y.o. female being seen today for her obstetrical visit. She is at 4325w4d gestation. Patient reports no complaints. Fetal movement: normal.  Problem List Items Addressed This Visit    None    Visit Diagnoses    Encounter for supervision of other normal pregnancy in third trimester    -  Primary    Relevant Orders    POCT urinalysis dipstick (Completed)    Anxiety        Relevant Medications    hydrOXYzine ([facility] ONLY - VISTARIL) capsule      Patient Active Problem List   Diagnosis Date Noted  . Encounter for routine screening for malformation using ultrasonics   . [redacted] weeks gestation of pregnancy   . Early or threatened labor 02/24/2012   Objective:    BP 121/71 mmHg  Pulse 98  Temp(Src) 97.6 F (36.4 C)  Wt 189 lb (85.73 kg)  LMP 04/03/2014 FHT:  160 BPM  Uterine Size: size equals dates  Presentation: unsure     Assessment:    Pregnancy @ 525w4d weeks   Plan:     labs reviewed, problem list updated Consent signed. GBS sent TDAP offered  Rhogam given for RH negative Pediatrician: discussed. Infant feeding: plans to breastfeed. Maternity leave: not discussed. Cigarette smoking: smokes 0.5 PPD. Orders Placed This Encounter  Procedures  . POCT urinalysis dipstick   Meds ordered this encounter  Medications  . hydrOXYzine (VISTARIL) 50 MG capsule    Sig: Take 1 capsule (50 mg total) by mouth 3 (three) times daily as needed for anxiety.    Dispense:  30 capsule    Refill:  2   Follow up in 2 Weeks.

## 2014-11-09 ENCOUNTER — Ambulatory Visit (INDEPENDENT_AMBULATORY_CARE_PROVIDER_SITE_OTHER): Payer: Medicaid Other | Admitting: Obstetrics

## 2014-11-09 ENCOUNTER — Encounter: Payer: Self-pay | Admitting: Obstetrics

## 2014-11-09 VITALS — BP 107/67 | HR 81 | Temp 98.0°F | Wt 190.0 lb

## 2014-11-09 DIAGNOSIS — Z3483 Encounter for supervision of other normal pregnancy, third trimester: Secondary | ICD-10-CM

## 2014-11-09 LAB — POCT URINALYSIS DIPSTICK
Bilirubin, UA: NEGATIVE
Blood, UA: NEGATIVE
Glucose, UA: NEGATIVE
Ketones, UA: NEGATIVE
Leukocytes, UA: NEGATIVE
Nitrite, UA: NEGATIVE
Protein, UA: NEGATIVE
Spec Grav, UA: 1.015
Urobilinogen, UA: NEGATIVE
pH, UA: 6

## 2014-11-09 NOTE — Progress Notes (Signed)
Subjective:    Brooke Carrillo is a 29 y.o. female being seen today for her obstetrical visit. She is at 156w3d gestation. Patient reports no complaints. Fetal movement: normal.  Problem List Items Addressed This Visit    None    Visit Diagnoses    Encounter for supervision of other normal pregnancy in third trimester    -  Primary    Relevant Orders    POCT urinalysis dipstick (Completed)      Patient Active Problem List   Diagnosis Date Noted  . Encounter for routine screening for malformation using ultrasonics   . [redacted] weeks gestation of pregnancy   . Early or threatened labor 02/24/2012   Objective:    BP 107/67 mmHg  Pulse 81  Temp(Src) 98 F (36.7 C)  Wt 190 lb (86.183 kg)  LMP 04/03/2014 FHT:  160 BPM  Uterine Size: size equals dates  Presentation: unsure     Assessment:    Pregnancy @ 296w3d weeks   Plan:     labs reviewed, problem list updated Consent signed. GBS sent TDAP offered  Rhogam given for RH negative Pediatrician: discussed. Infant feeding: plans to breastfeed. Maternity leave: discussed. Cigarette smoking: smokes 0.5 PPD. Orders Placed This Encounter  Procedures  . POCT urinalysis dipstick   No orders of the defined types were placed in this encounter.   Follow up in 2 Weeks.

## 2014-11-23 ENCOUNTER — Ambulatory Visit (INDEPENDENT_AMBULATORY_CARE_PROVIDER_SITE_OTHER): Payer: Medicaid Other | Admitting: Obstetrics

## 2014-11-23 VITALS — BP 119/76 | HR 92 | Temp 97.3°F | Wt 195.0 lb

## 2014-11-23 DIAGNOSIS — Z3483 Encounter for supervision of other normal pregnancy, third trimester: Secondary | ICD-10-CM

## 2014-11-23 LAB — POCT URINALYSIS DIPSTICK
Bilirubin, UA: NEGATIVE
Blood, UA: NEGATIVE
Glucose, UA: NEGATIVE
Ketones, UA: NEGATIVE
Leukocytes, UA: NEGATIVE
Nitrite, UA: NEGATIVE
Protein, UA: NEGATIVE
Spec Grav, UA: 1.01
Urobilinogen, UA: NEGATIVE
pH, UA: 7

## 2014-11-24 ENCOUNTER — Encounter: Payer: Self-pay | Admitting: Obstetrics

## 2014-11-24 NOTE — Progress Notes (Signed)
Subjective:    Brooke Carrillo is a 29 y.o. female being seen today for her obstetrical visit. She is at 4374w4d gestation. Patient reports no complaints. Fetal movement: normal.  Problem List Items Addressed This Visit    None    Visit Diagnoses    Encounter for supervision of other normal pregnancy in third trimester    -  Primary    Relevant Orders    POCT urinalysis dipstick (Completed)      Patient Active Problem List   Diagnosis Date Noted  . Encounter for routine screening for malformation using ultrasonics   . [redacted] weeks gestation of pregnancy   . Early or threatened labor 02/24/2012   Objective:    BP 119/76 mmHg  Pulse 92  Temp(Src) 97.3 F (36.3 C)  Wt 195 lb (88.451 kg)  LMP 04/03/2014 FHT:  160 BPM  Uterine Size: size equals dates  Presentation: unsure     Assessment:    Pregnancy @ 774w4d weeks   Plan:     labs reviewed, problem list updated Consent signed. GBS sent TDAP offered  Rhogam given for RH negative Pediatrician: discussed. Infant feeding: plans to breastfeed. Maternity leave: discussed. Cigarette smoking: smokes 0.5 PPD. Orders Placed This Encounter  Procedures  . POCT urinalysis dipstick   No orders of the defined types were placed in this encounter.   Follow up in 2 Weeks.

## 2014-12-06 ENCOUNTER — Encounter: Payer: Self-pay | Admitting: Obstetrics

## 2014-12-06 ENCOUNTER — Other Ambulatory Visit: Payer: Self-pay | Admitting: Obstetrics

## 2014-12-06 ENCOUNTER — Ambulatory Visit (INDEPENDENT_AMBULATORY_CARE_PROVIDER_SITE_OTHER): Payer: Medicaid Other | Admitting: Obstetrics

## 2014-12-06 VITALS — BP 112/73 | HR 110 | Temp 98.2°F | Wt 195.0 lb

## 2014-12-06 DIAGNOSIS — Z3483 Encounter for supervision of other normal pregnancy, third trimester: Secondary | ICD-10-CM

## 2014-12-06 LAB — OB RESULTS CONSOLE GC/CHLAMYDIA
Chlamydia: NEGATIVE
Gonorrhea: NEGATIVE

## 2014-12-06 LAB — POCT URINALYSIS DIPSTICK
Bilirubin, UA: NEGATIVE
Glucose, UA: NEGATIVE
Ketones, UA: NEGATIVE
Nitrite, UA: NEGATIVE
Spec Grav, UA: 1.01
Urobilinogen, UA: NEGATIVE
pH, UA: 6.5

## 2014-12-06 NOTE — Addendum Note (Signed)
Addended by: Marya LandryFOSTER, Aalijah Lanphere D on: 12/06/2014 03:43 PM   Modules accepted: Orders

## 2014-12-06 NOTE — Addendum Note (Signed)
Addended by: Marya LandryFOSTER, Skippy Marhefka D on: 12/06/2014 02:22 PM   Modules accepted: Orders

## 2014-12-06 NOTE — Progress Notes (Signed)
Subjective:    Brooke Carrillo is a 29 y.o. female being seen today for her obstetrical visit. She is at 5054w2d gestation. Patient reports occasional contractions and leaking clear fluid, small amount. Fetal movement: normal.  Problem List Items Addressed This Visit    None     Patient Active Problem List   Diagnosis Date Noted  . Encounter for routine screening for malformation using ultrasonics   . [redacted] weeks gestation of pregnancy   . Early or threatened labor 02/24/2012   Objective:    BP 112/73 mmHg  Pulse 110  Temp(Src) 98.2 F (36.8 C)  Wt 195 lb (88.451 kg)  LMP 04/03/2014 FHT:  160 BPM  Uterine Size: size equals dates  Presentation: unsure     Speculum exam:  No pooling.  Nitrazine negative.  Assessment:    Pregnancy @ 3454w2d weeks   Plan:     labs reviewed, problem list updated Consent signed. GBS sent TDAP offered  Rhogam given for RH negative Pediatrician: discussed. Infant feeding: plans to breastfeed. Maternity leave: discussed. Cigarette smoking: smokes 0.5 PPD. No orders of the defined types were placed in this encounter.   No orders of the defined types were placed in this encounter.   Follow up in 1 Week.

## 2014-12-08 LAB — STREP B DNA PROBE: GBSP: NOT DETECTED

## 2014-12-09 LAB — SURESWAB, VAGINOSIS/VAGINITIS PLUS
Atopobium vaginae: NOT DETECTED Log (cells/mL)
C. albicans, DNA: NOT DETECTED
C. glabrata, DNA: NOT DETECTED
C. parapsilosis, DNA: NOT DETECTED
C. trachomatis RNA, TMA: NOT DETECTED
C. tropicalis, DNA: NOT DETECTED
Gardnerella vaginalis: 5.8 Log (cells/mL)
LACTOBACILLUS SPECIES: >8 Log (cells/mL)
MEGASPHAERA SPECIES: NOT DETECTED Log (cells/mL)
N. gonorrhoeae RNA, TMA: NOT DETECTED
T. vaginalis RNA, QL TMA: NOT DETECTED

## 2014-12-09 LAB — CULTURE, OB URINE

## 2014-12-10 ENCOUNTER — Other Ambulatory Visit: Payer: Self-pay | Admitting: Obstetrics

## 2014-12-10 DIAGNOSIS — N390 Urinary tract infection, site not specified: Secondary | ICD-10-CM

## 2014-12-10 MED ORDER — NITROFURANTOIN MONOHYD MACRO 100 MG PO CAPS: 100.0000 mg | ORAL_CAPSULE | Freq: Two times a day (BID) | ORAL | Status: DC

## 2014-12-13 ENCOUNTER — Ambulatory Visit (INDEPENDENT_AMBULATORY_CARE_PROVIDER_SITE_OTHER): Payer: Medicaid Other | Admitting: Obstetrics

## 2014-12-13 VITALS — BP 126/76 | HR 106 | Temp 98.6°F | Wt 198.0 lb

## 2014-12-13 DIAGNOSIS — Z3483 Encounter for supervision of other normal pregnancy, third trimester: Secondary | ICD-10-CM

## 2014-12-13 LAB — POCT URINALYSIS DIPSTICK
Bilirubin, UA: NEGATIVE
Blood, UA: NEGATIVE
Glucose, UA: NEGATIVE
Ketones, UA: NEGATIVE
Leukocytes, UA: NEGATIVE
Nitrite, UA: NEGATIVE
Protein, UA: NEGATIVE
Spec Grav, UA: 1.01
Urobilinogen, UA: NEGATIVE
pH, UA: 6.5

## 2014-12-14 ENCOUNTER — Encounter: Payer: Self-pay | Admitting: Obstetrics

## 2014-12-14 NOTE — Progress Notes (Signed)
Subjective:    Brooke Carrillo is a 29 y.o. female being seen today for her obstetrical visit. She is at 7364w3d gestation. Patient reports no complaints. Fetal movement: normal.  Problem List Items Addressed This Visit    None    Visit Diagnoses    Encounter for supervision of other normal pregnancy in third trimester    -  Primary    Relevant Orders    POCT urinalysis dipstick (Completed)      Patient Active Problem List   Diagnosis Date Noted  . Encounter for routine screening for malformation using ultrasonics   . [redacted] weeks gestation of pregnancy   . Early or threatened labor 02/24/2012   Objective:    BP 126/76 mmHg  Pulse 106  Temp(Src) 98.6 F (37 C)  Wt 198 lb (89.812 kg)  LMP 04/03/2014 FHT:  150 BPM  Uterine Size: size equals dates  Presentation: unsure     Assessment:    Pregnancy @ 9564w3d weeks   Plan:     labs reviewed, problem list updated Consent signed. GBS sent TDAP offered  Rhogam given for RH negative Pediatrician: discussed. Infant feeding: plans to breastfeed. Maternity leave: discussed. Cigarette smoking: smokes 0.5 PPD. Orders Placed This Encounter  Procedures  . POCT urinalysis dipstick   No orders of the defined types were placed in this encounter.   Follow up in 1 Week.

## 2014-12-17 ENCOUNTER — Inpatient Hospital Stay (HOSPITAL_COMMUNITY)
Admission: AD | Admit: 2014-12-17 | Discharge: 2014-12-17 | Disposition: A | Discharge status: Home / Self Care | Payer: Medicaid Other | Source: Ambulatory Visit | Attending: Obstetrics | Admitting: Obstetrics

## 2014-12-17 ENCOUNTER — Encounter (HOSPITAL_COMMUNITY): Payer: Self-pay

## 2014-12-17 DIAGNOSIS — R109 Unspecified abdominal pain: Secondary | ICD-10-CM | POA: Diagnosis present

## 2014-12-17 DIAGNOSIS — Z3A36 36 weeks gestation of pregnancy: Secondary | ICD-10-CM | POA: Diagnosis not present

## 2014-12-17 DIAGNOSIS — O99333 Smoking (tobacco) complicating pregnancy, third trimester: Secondary | ICD-10-CM | POA: Diagnosis not present

## 2014-12-17 DIAGNOSIS — O26893 Other specified pregnancy related conditions, third trimester: Secondary | ICD-10-CM

## 2014-12-17 DIAGNOSIS — N898 Other specified noninflammatory disorders of vagina: Secondary | ICD-10-CM | POA: Diagnosis not present

## 2014-12-17 DIAGNOSIS — O9989 Other specified diseases and conditions complicating pregnancy, childbirth and the puerperium: Secondary | ICD-10-CM | POA: Insufficient documentation

## 2014-12-17 LAB — POCT FERN TEST: POCT Fern Test: NEGATIVE

## 2014-12-17 NOTE — MAU Provider Note (Signed)
  History     CSN: [id number]  Arrival date and time: 12/17/14 2039   First Provider Initiated Contact with Patient 12/17/14 2127      Chief Complaint  Patient presents with  . Abdominal Pain  . Vaginal Discharge   HPI  Ms. Brooke Carrillo is a 29 y.o. [phone] at 4216w6d here with report of "gush of fluid" down leg while on the phone tonight at 2300.  Also reports irregular contractions occuring every 10 minutes.  +intercourse last night.  Denies vaginal bleeding.     Past Medical History  Diagnosis Date  . No pertinent past medical history   . Pregnancy as incidental finding   . Medical history non-contributory     Past Surgical History  Procedure Laterality Date  . Wisdom tooth extraction    . Orif finger / thumb fracture      Family History  Problem Relation Age of Onset  . Hypertension Father   . Dementia Mother     History  Substance Use Topics  . Smoking status: Current Every Day Smoker -- 0.50 packs/day  . Smokeless tobacco: Never Used  . Alcohol Use: No    Allergies: No Known Allergies  Prescriptions prior to admission  Medication Sig Dispense Refill Last Dose  . acetaminophen (TYLENOL) 500 MG tablet Take 500 mg by mouth every 6 (six) hours as needed for mild pain.   Taking  . butalbital-acetaminophen-caffeine (FIORICET) 50-325-40 MG per tablet Take 2 tablets by mouth every 6 (six) hours as needed for headache. 40 tablet 2 Taking  . hydrOXYzine (VISTARIL) 50 MG capsule Take 1 capsule (50 mg total) by mouth 3 (three) times daily as needed for anxiety. (Patient not taking: Reported on 12/13/2014) 30 capsule 2 Not Taking  . nitrofurantoin, macrocrystal-monohydrate, (MACROBID) 100 MG capsule Take 1 capsule (100 mg total) by mouth 2 (two) times daily. 1 po BID x 7days 14 capsule 2 Taking  . omeprazole (PRILOSEC) 20 MG capsule Take 1 capsule (20 mg total) by mouth 2 (two) times daily before a meal. 60 capsule 5 Taking  . Prenat-FeCbn-FeAspGl-FA-Omega (OB COMPLETE  PETITE) 35-5-1-200 MG CAPS Take 1 capsule by mouth daily. 30 capsule 11 Taking    Review of Systems  Gastrointestinal: Positive for abdominal pain (contractions).  Genitourinary:       Vaginal discharge  All other systems reviewed and are negative.  Physical Exam   Last menstrual period 04/03/2014.  Physical Exam  Constitutional: She is oriented to person, place, and time. She appears well-developed and well-nourished. No distress.  HENT:  Head: Normocephalic.  Neck: Normal range of motion. Neck supple.  Cardiovascular: Normal rate, regular rhythm and normal heart sounds.   Respiratory: Effort normal and breath sounds normal.  GI: Soft. There is no tenderness.  Genitourinary: No bleeding in the vagina. Vaginal discharge found.  Neg fern; neg pooling  Neurological: She is alert and oriented to person, place, and time.  Skin: Skin is warm and dry.   Dilation: Closed Exam by:: Margarita MailW. Karim CNM   MAU Course  Procedures   Assessment and Plan  29 y.o. [id number] at 2416w6d IUP Vaginal Discharge Reactive NST  Plan: Discharge to home Reviewed Labor Precautions Keep scheduled appointment  Elenora FenderKARIM, [facility] PcWALIDAH N 12/17/2014, 9:31 PM

## 2014-12-17 NOTE — MAU Note (Signed)
Pt presents complaining of vaginal pain and irregular abdominal cramping that started today. States she had a trickle of fluid down her leg at 11am. Denies continuing leaking. Reports decreased fetal movement.

## 2014-12-17 NOTE — Discharge Instructions (Signed)
LABOR SIGNS  Consistent Contractions: When you begin to experience regular uterine contractions, it is the strongest indication that you are in labor. When this happens, it is a good time to get out your notebook and record the exact time each contraction begins and how long they last.  These contractions can feel like menstrual cramps or like a lower backache that comes and goes. During early labor they might be as far apart as 20 to 30 minutes. Over the course of time, your contractions are likely to begin occurring at shorter intervals of perhaps every 10-15 minutes or less.  When your contractions are consistently 5  minutes apart, it is time to call your health care provider.  Labor Contractions Have the Following Characteristics:  They are regular They follow a predictable pattern (such as every eight minutes) They become progressively closer They last progressively longer They become progressively stronger A change in activity or body position will not slow down or stop contractions Your mucus plug may appear Membranes might rupture Your health care provider will notice cervical changes, such as effacement (thinning) or dilation

## 2014-12-21 ENCOUNTER — Encounter: Payer: Self-pay | Admitting: Obstetrics

## 2014-12-21 ENCOUNTER — Ambulatory Visit (INDEPENDENT_AMBULATORY_CARE_PROVIDER_SITE_OTHER): Payer: Medicaid Other | Admitting: Obstetrics

## 2014-12-21 VITALS — BP 123/78 | HR 115 | Temp 97.1°F | Wt 200.0 lb

## 2014-12-21 DIAGNOSIS — Z3483 Encounter for supervision of other normal pregnancy, third trimester: Secondary | ICD-10-CM

## 2014-12-21 LAB — POCT URINALYSIS DIPSTICK
Bilirubin, UA: NEGATIVE
Blood, UA: NEGATIVE
Glucose, UA: NEGATIVE
Ketones, UA: NEGATIVE
Leukocytes, UA: NEGATIVE
Nitrite, UA: NEGATIVE
Protein, UA: NEGATIVE
Spec Grav, UA: 1.01
Urobilinogen, UA: NEGATIVE
pH, UA: 7.5

## 2014-12-21 NOTE — Progress Notes (Signed)
Subjective:    Brooke Carrillo is a 29 y.o. female being seen today for her obstetrical visit. She is at 3024w3d gestation. Patient reports no complaints. Fetal movement: normal.  Problem List Items Addressed This Visit    None    Visit Diagnoses    Encounter for supervision of other normal pregnancy in third trimester    -  Primary    Relevant Orders    POCT urinalysis dipstick (Completed)      Patient Active Problem List   Diagnosis Date Noted  . Encounter for routine screening for malformation using ultrasonics   . [redacted] weeks gestation of pregnancy   . Early or threatened labor 02/24/2012    Objective:    BP 123/78 mmHg  Pulse 115  Temp(Src) 97.1 F (36.2 C)  Wt 200 lb (90.719 kg)  LMP 04/03/2014 FHT: 150 BPM  Uterine Size: size equals dates  Presentations: cephalic  Pelvic Exam: Deferred    Assessment:    Pregnancy @ 3224w3d weeks   Plan:   Plans for delivery: Vaginal anticipated; labs reviewed; problem list updated Counseling: Consent signed. Infant feeding: plans to breastfeed. Cigarette smoking: smokes 0.5 PPD. L&D discussion: symptoms of labor, discussed when to call, discussed what number to call, anesthetic/analgesic options reviewed and delivering clinician:  plans Physician. Postpartum supports and preparation: circumcision discussed and contraception plans discussed.  Follow up in 1 Week.

## 2014-12-26 ENCOUNTER — Telehealth: Payer: Self-pay | Admitting: *Deleted

## 2014-12-26 ENCOUNTER — Inpatient Hospital Stay (HOSPITAL_COMMUNITY)
Admission: AD | Admit: 2014-12-26 | Discharge: 2014-12-26 | Disposition: A | Discharge status: Home / Self Care | Payer: Medicaid Other | Source: Ambulatory Visit | Attending: Obstetrics | Admitting: Obstetrics

## 2014-12-26 ENCOUNTER — Encounter (HOSPITAL_COMMUNITY): Payer: Self-pay | Admitting: *Deleted

## 2014-12-26 DIAGNOSIS — Z3A38 38 weeks gestation of pregnancy: Secondary | ICD-10-CM | POA: Insufficient documentation

## 2014-12-26 LAB — URINALYSIS, ROUTINE W REFLEX MICROSCOPIC
Bilirubin Urine: NEGATIVE
Glucose, UA: NEGATIVE mg/dL
Hgb urine dipstick: NEGATIVE
Ketones, ur: NEGATIVE mg/dL
Leukocytes, UA: NEGATIVE
Nitrite: NEGATIVE
Protein, ur: NEGATIVE mg/dL
Specific Gravity, Urine: 1.02 (ref 1.005–1.030)
Urobilinogen, UA: 0.2 mg/dL (ref 0.0–1.0)
pH: 6.5 (ref 5.0–8.0)

## 2014-12-26 LAB — POCT FERN TEST

## 2014-12-26 NOTE — MAU Provider Note (Signed)
Ms. Brooke Carrillo is a 29 y.o. [id number] at 6270w1d  who presents to MAU today complaining LOF. She states occasional mild contractions. She denies vaginal bleeding or complications with the pregnancy.   BP 117/76 mmHg  Pulse 120  Temp(Src) 98.2 F (36.8 C) (Oral)  Resp 18  LMP 04/03/2014 GENERAL: Well-developed, well-nourished female in no acute distress.  HEENT: Normocephalic, atraumatic.  LUNGS: Normal effort HEART: Regular rate  ABDOMEN: Soft, nontender, nondistended.  PELVIC: Normal external female genitalia. Vagina is pink and rugated.  Normal discharge.  negative pooling. Gravid uterus.   EXTREMITIES: No cyanosis, clubbing, or edema  Fern - negative  Fetal Monitoring:  Baseline: 140 bpm, moderate variability, + accelerations, no decelerations Contractions: irregular  A: Membranes intact  P: Report given to RN to contact MD on call for further instructions  Marny LowensteinJulie N Wenzel, PA-C 12/26/2014 12:39 PM

## 2014-12-26 NOTE — MAU Note (Signed)
C/o ?SROM around 2300 last night;

## 2014-12-26 NOTE — Discharge Instructions (Signed)

## 2014-12-26 NOTE — Telephone Encounter (Signed)
11:42 LM on VM to CB (Patient spoke to FranklinBrenda earlier and per [place]- patient thinks she is leaking fluid- advised to go to MAU)

## 2014-12-28 ENCOUNTER — Telehealth (HOSPITAL_COMMUNITY): Payer: Self-pay | Admitting: *Deleted

## 2014-12-28 ENCOUNTER — Encounter (HOSPITAL_COMMUNITY): Payer: Self-pay | Admitting: *Deleted

## 2014-12-28 ENCOUNTER — Ambulatory Visit (INDEPENDENT_AMBULATORY_CARE_PROVIDER_SITE_OTHER): Payer: Medicaid Other | Admitting: Certified Nurse Midwife

## 2014-12-28 VITALS — BP 120/79 | HR 83 | Temp 97.2°F | Wt 200.0 lb

## 2014-12-28 DIAGNOSIS — Z3483 Encounter for supervision of other normal pregnancy, third trimester: Secondary | ICD-10-CM

## 2014-12-28 DIAGNOSIS — O471 False labor at or after 37 completed weeks of gestation: Secondary | ICD-10-CM | POA: Diagnosis not present

## 2014-12-28 LAB — POCT URINALYSIS DIPSTICK
Bilirubin, UA: NEGATIVE
Blood, UA: NEGATIVE
Glucose, UA: NEGATIVE
Ketones, UA: NEGATIVE
Leukocytes, UA: NEGATIVE
Nitrite, UA: NEGATIVE
Protein, UA: NEGATIVE
Spec Grav, UA: 1.01
Urobilinogen, UA: NEGATIVE
pH, UA: 7

## 2014-12-28 NOTE — Telephone Encounter (Signed)
Preadmission screen  

## 2014-12-28 NOTE — Progress Notes (Signed)
Subjective:    Brooke Carrillo is a 29 y.o. female being seen today for her obstetrical visit. She is at 4683w3d gestation. Patient reports backache, no bleeding, no contractions, no leaking, occasional contractions and stress over spouses schedule, spouse is scheduled to leave 01/04/15 for two weeks for work. Fetal movement: normal.  Problem List Items Addressed This Visit    None    Visit Diagnoses    Encounter for supervision of other normal pregnancy in third trimester    -  Primary    Relevant Orders    POCT urinalysis dipstick      Patient Active Problem List   Diagnosis Date Noted  . Encounter for routine screening for malformation using ultrasonics   . [redacted] weeks gestation of pregnancy   . Early or threatened labor 02/24/2012    Objective:    BP 120/79 mmHg  Pulse 83  Temp(Src) 97.2 F (36.2 C)  Wt 90.719 kg (200 lb)  LMP 04/03/2014 FHT: 140's BPM  Uterine Size: 40 cm  Presentations: cephalic  Pelvic Exam:              Dilation: 3cm       Effacement: 50%             Station:  -3    Consistency: soft            Position: posterior    Cat. 1 tracing, reactive strip, multiple 15X15 accelerations, no decels.  Occasional cxn.    Assessment:  Doing well  Pregnancy @ 283w3d weeks  Tobacco abuse Reactive NST  Plan:   Plans for delivery: Vaginal anticipated; labs reviewed; problem list updated IOL scheduled for 01/03/15. Infant feeding: plans to breastfeed. Cigarette smoking: smokes 1 PPD. L&D discussion: symptoms of labor, discussed when to call, discussed what number to call, anesthetic/analgesic options reviewed and delivering clinician:  plans no preference. Postpartum supports and preparation: circumcision discussed and contraception plans discussed.  Follow up in 01/02/15 for membrane sweep.

## 2015-01-02 ENCOUNTER — Ambulatory Visit (INDEPENDENT_AMBULATORY_CARE_PROVIDER_SITE_OTHER): Payer: Medicaid Other | Admitting: Certified Nurse Midwife

## 2015-01-02 VITALS — BP 131/82 | HR 95 | Temp 98.0°F | Wt 202.0 lb

## 2015-01-02 DIAGNOSIS — Z3483 Encounter for supervision of other normal pregnancy, third trimester: Secondary | ICD-10-CM | POA: Diagnosis not present

## 2015-01-02 LAB — POCT URINALYSIS DIPSTICK
Bilirubin, UA: NEGATIVE
Blood, UA: NEGATIVE
Glucose, UA: NEGATIVE
Ketones, UA: NEGATIVE
Leukocytes, UA: NEGATIVE
Nitrite, UA: NEGATIVE
Protein, UA: NEGATIVE
Spec Grav, UA: 1.01
Urobilinogen, UA: NEGATIVE
pH, UA: 7

## 2015-01-02 NOTE — Progress Notes (Signed)
Subjective:    Brooke Carrillo is a 29 y.o. female being seen today for her obstetrical visit. She is at 4443w1d gestation. Patient reports backache, no bleeding, no cramping, no leaking, occasional contractions and loss of mucus plug. Fetal movement: normal.  Husband is scheduled to leave town on Thursday for several weeks.  Patient has less anxiety today.  Desires postpartum Mirena.   Discussed IOL plans, encouraged early movement.  Patient desires epidural.    Problem List Items Addressed This Visit    None    Visit Diagnoses    Encounter for supervision of other normal pregnancy in third trimester    -  Primary    Relevant Orders    POCT urinalysis dipstick (Completed)      Patient Active Problem List   Diagnosis Date Noted  . Encounter for routine screening for malformation using ultrasonics   . [redacted] weeks gestation of pregnancy   . Early or threatened labor 02/24/2012    Objective:    BP 131/82 mmHg  Pulse 95  Temp(Src) 98 F (36.7 C)  Wt 91.627 kg (202 lb)  LMP 04/03/2014 FHT: 140's BPM  Uterine Size: size equals dates  Presentations: cephalic and bedside ultrasound confirmation  Pelvic Exam:              Dilation: 4cm       Effacement: 50%             Station:  -3    Consistency: soft            Position: middle   EFW: 7 lbs  Assessment:    Pregnancy @ 6743w1d weeks   Doing well IOL in AM  Plan:   Plans for delivery: Vaginal anticipated; labs reviewed; problem list updated Counseling: Consent signed. Infant feeding: plans to breastfeed. Cigarette smoking: smokes 1/2 PPD. L&D discussion: symptoms of labor, discussed when to call, discussed what number to call, anesthetic/analgesic options reviewed and delivering clinician:  plans Certified Nurse-Midwife. Postpartum supports and preparation: circumcision discussed and contraception plans discussed.  Follow up in am for IOL.

## 2015-01-03 ENCOUNTER — Encounter (HOSPITAL_COMMUNITY): Payer: Self-pay

## 2015-01-03 ENCOUNTER — Inpatient Hospital Stay (HOSPITAL_COMMUNITY)
Admission: RE | Admit: 2015-01-03 | Discharge: 2015-01-05 | DRG: 775 | Disposition: A | Discharge status: Home / Self Care | Payer: Medicaid Other | Source: Ambulatory Visit | Attending: Obstetrics | Admitting: Obstetrics

## 2015-01-03 ENCOUNTER — Inpatient Hospital Stay (HOSPITAL_COMMUNITY): Payer: Medicaid Other | Admitting: Anesthesiology

## 2015-01-03 DIAGNOSIS — Z3A39 39 weeks gestation of pregnancy: Secondary | ICD-10-CM | POA: Diagnosis present

## 2015-01-03 DIAGNOSIS — O99334 Smoking (tobacco) complicating childbirth: Secondary | ICD-10-CM | POA: Diagnosis present

## 2015-01-03 DIAGNOSIS — Z8249 Family history of ischemic heart disease and other diseases of the circulatory system: Secondary | ICD-10-CM

## 2015-01-03 DIAGNOSIS — O9989 Other specified diseases and conditions complicating pregnancy, childbirth and the puerperium: Secondary | ICD-10-CM | POA: Diagnosis present

## 2015-01-03 LAB — CBC
HCT: 28.3 % — ABNORMAL LOW (ref 36.0–46.0)
Hemoglobin: 9.8 g/dL — ABNORMAL LOW (ref 12.0–15.0)
MCH: 31.5 pg (ref 26.0–34.0)
MCHC: 34.6 g/dL (ref 30.0–36.0)
MCV: 91 fL (ref 78.0–100.0)
Platelets: 367 10*3/uL (ref 150–400)
RBC: 3.11 MIL/uL — ABNORMAL LOW (ref 3.87–5.11)
RDW: 13.7 % (ref 11.5–15.5)
WBC: 25.2 10*3/uL — ABNORMAL HIGH (ref 4.0–10.5)

## 2015-01-03 LAB — RPR: RPR Ser Ql: NONREACTIVE

## 2015-01-03 MED ORDER — SENNOSIDES-DOCUSATE SODIUM 8.6-50 MG PO TABS
2.0000 | ORAL_TABLET | ORAL | Status: DC
  Administered 2015-01-03 – 2015-01-04 (×2): 2 via ORAL
  Filled 2015-01-03 (×2): qty 2

## 2015-01-03 MED ORDER — OXYCODONE-ACETAMINOPHEN 5-325 MG PO TABS: 2.0000 | ORAL_TABLET | ORAL | Status: DC | PRN

## 2015-01-03 MED ORDER — FENTANYL 2.5 MCG/ML BUPIVACAINE 1/10 % EPIDURAL INFUSION (WH - ANES)
14.0000 mL/h | INTRAMUSCULAR | Status: DC | PRN
  Administered 2015-01-03: 14 mL/h via EPIDURAL
  Filled 2015-01-03: qty 125

## 2015-01-03 MED ORDER — ACETAMINOPHEN 325 MG PO TABS: 650.0000 mg | ORAL_TABLET | ORAL | Status: DC | PRN

## 2015-01-03 MED ORDER — OXYTOCIN 40 UNITS IN LACTATED RINGERS INFUSION - SIMPLE MED: 62.5000 mL/h | INTRAVENOUS | Status: DC | PRN

## 2015-01-03 MED ORDER — OXYTOCIN 40 UNITS IN LACTATED RINGERS INFUSION - SIMPLE MED: 62.5000 mL/h | INTRAVENOUS | Status: DC

## 2015-01-03 MED ORDER — LACTATED RINGERS IV SOLN: 500.0000 mL | INTRAVENOUS | Status: DC | PRN

## 2015-01-03 MED ORDER — PRENATAL MULTIVITAMIN CH
1.0000 | ORAL_TABLET | Freq: Every day | ORAL | Status: DC
  Administered 2015-01-04 – 2015-01-05 (×2): 1 via ORAL
  Filled 2015-01-03 (×2): qty 1

## 2015-01-03 MED ORDER — METHYLERGONOVINE MALEATE 0.2 MG PO TABS: 0.2000 mg | ORAL_TABLET | ORAL | Status: DC | PRN

## 2015-01-03 MED ORDER — OXYTOCIN 40 UNITS IN LACTATED RINGERS INFUSION - SIMPLE MED
1.0000 m[IU]/min | INTRAVENOUS | Status: DC
  Administered 2015-01-03: 2 m[IU]/min via INTRAVENOUS
  Filled 2015-01-03: qty 1000

## 2015-01-03 MED ORDER — EPHEDRINE 5 MG/ML INJ
10.0000 mg | INTRAVENOUS | Status: DC | PRN
  Filled 2015-01-03: qty 2

## 2015-01-03 MED ORDER — LANOLIN HYDROUS EX OINT: TOPICAL_OINTMENT | CUTANEOUS | Status: DC | PRN

## 2015-01-03 MED ORDER — OXYCODONE-ACETAMINOPHEN 5-325 MG PO TABS: 1.0000 | ORAL_TABLET | ORAL | Status: DC | PRN

## 2015-01-03 MED ORDER — ONDANSETRON HCL 4 MG PO TABS: 4.0000 mg | ORAL_TABLET | ORAL | Status: DC | PRN

## 2015-01-03 MED ORDER — OXYTOCIN BOLUS FROM INFUSION: 500.0000 mL | INTRAVENOUS | Status: DC

## 2015-01-03 MED ORDER — ONDANSETRON HCL 4 MG/2ML IJ SOLN: 4.0000 mg | Freq: Four times a day (QID) | INTRAMUSCULAR | Status: DC | PRN

## 2015-01-03 MED ORDER — METHYLERGONOVINE MALEATE 0.2 MG/ML IJ SOLN
0.2000 mg | INTRAMUSCULAR | Status: DC | PRN
Start: 2015-01-03 — End: 2015-01-05

## 2015-01-03 MED ORDER — OXYCODONE-ACETAMINOPHEN 5-325 MG PO TABS
2.0000 | ORAL_TABLET | ORAL | Status: DC | PRN
  Administered 2015-01-03 – 2015-01-05 (×2): 2 via ORAL
  Filled 2015-01-03 (×2): qty 2

## 2015-01-03 MED ORDER — ZOLPIDEM TARTRATE 5 MG PO TABS: 5.0000 mg | ORAL_TABLET | Freq: Every evening | ORAL | Status: DC | PRN

## 2015-01-03 MED ORDER — DIPHENHYDRAMINE HCL 50 MG/ML IJ SOLN: 12.5000 mg | INTRAMUSCULAR | Status: DC | PRN

## 2015-01-03 MED ORDER — SIMETHICONE 80 MG PO CHEW: 80.0000 mg | CHEWABLE_TABLET | ORAL | Status: DC | PRN

## 2015-01-03 MED ORDER — LIDOCAINE HCL (PF) 1 % IJ SOLN
30.0000 mL | INTRAMUSCULAR | Status: DC | PRN
  Filled 2015-01-03: qty 30

## 2015-01-03 MED ORDER — TETANUS-DIPHTH-ACELL PERTUSSIS 5-2.5-18.5 LF-MCG/0.5 IM SUSP
0.5000 mL | Freq: Once | INTRAMUSCULAR | Status: AC
  Administered 2015-01-04: 0.5 mL via INTRAMUSCULAR

## 2015-01-03 MED ORDER — WITCH HAZEL-GLYCERIN EX PADS: 1.0000 "application " | MEDICATED_PAD | CUTANEOUS | Status: DC | PRN

## 2015-01-03 MED ORDER — LACTATED RINGERS IV SOLN
INTRAVENOUS | Status: DC
  Administered 2015-01-03 (×2): via INTRAVENOUS

## 2015-01-03 MED ORDER — LIDOCAINE HCL (PF) 1 % IJ SOLN
INTRAMUSCULAR | Status: DC | PRN
  Administered 2015-01-03 (×2): 5 mL

## 2015-01-03 MED ORDER — IBUPROFEN 600 MG PO TABS
600.0000 mg | ORAL_TABLET | Freq: Four times a day (QID) | ORAL | Status: DC
  Administered 2015-01-03 – 2015-01-05 (×8): 600 mg via ORAL
  Filled 2015-01-03 (×8): qty 1

## 2015-01-03 MED ORDER — LACTATED RINGERS IV SOLN: 500.0000 mL | Freq: Once | INTRAVENOUS | Status: DC

## 2015-01-03 MED ORDER — PHENYLEPHRINE 40 MCG/ML (10ML) SYRINGE FOR IV PUSH (FOR BLOOD PRESSURE SUPPORT)
80.0000 ug | PREFILLED_SYRINGE | INTRAVENOUS | Status: DC | PRN
  Filled 2015-01-03: qty 2

## 2015-01-03 MED ORDER — BENZOCAINE-MENTHOL 20-0.5 % EX AERO: 1.0000 "application " | INHALATION_SPRAY | CUTANEOUS | Status: DC | PRN

## 2015-01-03 MED ORDER — TERBUTALINE SULFATE 1 MG/ML IJ SOLN
0.2500 mg | Freq: Once | INTRAMUSCULAR | Status: DC | PRN
  Filled 2015-01-03: qty 1

## 2015-01-03 MED ORDER — OXYCODONE-ACETAMINOPHEN 5-325 MG PO TABS
1.0000 | ORAL_TABLET | ORAL | Status: DC | PRN
  Administered 2015-01-04 (×3): 1 via ORAL
  Filled 2015-01-03 (×3): qty 1

## 2015-01-03 MED ORDER — DIBUCAINE 1 % RE OINT: 1.0000 "application " | TOPICAL_OINTMENT | RECTAL | Status: DC | PRN

## 2015-01-03 MED ORDER — ONDANSETRON HCL 4 MG/2ML IJ SOLN: 4.0000 mg | INTRAMUSCULAR | Status: DC | PRN

## 2015-01-03 MED ORDER — OXYTOCIN 40 UNITS IN LACTATED RINGERS INFUSION - SIMPLE MED: 1.0000 m[IU]/min | INTRAVENOUS | Status: DC

## 2015-01-03 MED ORDER — CITRIC ACID-SODIUM CITRATE 334-500 MG/5ML PO SOLN: 30.0000 mL | ORAL | Status: DC | PRN

## 2015-01-03 MED ORDER — DIPHENHYDRAMINE HCL 25 MG PO CAPS: 25.0000 mg | ORAL_CAPSULE | Freq: Four times a day (QID) | ORAL | Status: DC | PRN

## 2015-01-03 MED ORDER — PHENYLEPHRINE 40 MCG/ML (10ML) SYRINGE FOR IV PUSH (FOR BLOOD PRESSURE SUPPORT)
80.0000 ug | PREFILLED_SYRINGE | INTRAVENOUS | Status: DC | PRN
  Filled 2015-01-03: qty 20
  Filled 2015-01-03: qty 2

## 2015-01-03 NOTE — Anesthesia Procedure Notes (Signed)
Epidural Patient location during procedure: OB Start time: 01/03/2015 10:28 AM  Staffing Anesthesiologist: Brayton CavesJACKSON, Sema Stangler Performed by: anesthesiologist   Preanesthetic Checklist Completed: patient identified, site marked, surgical consent, pre-op evaluation, timeout performed, IV checked, risks and benefits discussed and monitors and equipment checked  Epidural Patient position: sitting Prep: site prepped and draped and DuraPrep Patient monitoring: continuous pulse ox and blood pressure Approach: midline Location: L3-L4 Injection technique: LOR air  Needle:  Needle type: Tuohy  Needle gauge: 17 G Needle length: 9 cm and 9 Needle insertion depth: 5 cm cm Catheter type: closed end flexible Catheter size: 19 Gauge Catheter at skin depth: 10 cm Test dose: negative  Assessment Events: blood not aspirated, injection not painful, no injection resistance, negative IV test and no paresthesia  Additional Notes Patient identified.  Risk benefits discussed including failed block, incomplete pain control, headache, nerve damage, paralysis, blood pressure changes, nausea, vomiting, reactions to medication both toxic or allergic, and postpartum back pain.  Patient expressed understanding and wished to proceed.  All questions were answered.  Sterile technique used throughout procedure and epidural site dressed with sterile barrier dressing. No paresthesia or other complications noted.The patient did not experience any signs of intravascular injection such as tinnitus or metallic taste in mouth nor signs of intrathecal spread such as rapid motor block. Please see nursing notes for vital signs.

## 2015-01-03 NOTE — H&P (Signed)
Brooke Carrillo is a 29 y.o. female presenting for elective IOL for multiparity, favorable cervix. Maternal Medical History:  Reason for admission: IOL   Fetal activity: Perceived fetal activity is normal.   Last perceived fetal movement was within the past hour.    Prenatal complications: no prenatal complications Prenatal Complications - Diabetes: none.    OB History    Gravida Para Term Preterm AB TAB SAB Ectopic Multiple Living   6 2 2  0 3 1 2  0 0 2     Past Medical History  Diagnosis Date  . No pertinent past medical history   . Pregnancy as incidental finding   . Medical history non-contributory    Past Surgical History  Procedure Laterality Date  . Wisdom tooth extraction    . Orif finger / thumb fracture     Family History: family history includes Dementia in her mother; Hypertension in her father. Social History:  reports that she has been smoking.  She has never used smokeless tobacco. She reports that she does not drink alcohol or use illicit drugs.   Prenatal Transfer Tool  Maternal Diabetes: No Genetic Screening: Normal Maternal Ultrasounds/Referrals: Normal Fetal Ultrasounds or other Referrals:  None Maternal Substance Abuse:  No Significant Maternal Medications:  None Significant Maternal Lab Results:  None Other Comments:  None  Review of Systems  All other systems reviewed and are negative.   Dilation: 4 Effacement (%): 50 Station: -2 Exam by:: J.Cox, RN Blood pressure 132/68, pulse 74, height 5\' 3"  (1.6 m), weight 202 lb (91.627 kg), last menstrual period 04/03/2014. Maternal Exam:  Abdomen: Patient reports no abdominal tenderness. Fetal presentation: vertex  Introitus: Normal vulva. Normal vagina.  Pelvis: adequate for delivery.   Cervix: Cervix evaluated by digital exam.     Physical Exam  Nursing note and vitals reviewed. Constitutional: She is oriented to person, place, and time.  HENT:  Head: Normocephalic and atraumatic.  Eyes:  Conjunctivae are normal. Pupils are equal, round, and reactive to light.  Neck: Normal range of motion.  Cardiovascular: Normal rate and regular rhythm.   Respiratory: Effort normal.  GI: Soft.  Genitourinary: Vagina normal and uterus normal.  Musculoskeletal: Normal range of motion.  Neurological: She is alert and oriented to person, place, and time.  Skin: Skin is warm and dry.  Psychiatric: She has a normal mood and affect. Her behavior is normal. Judgment and thought content normal.    Prenatal labs: ABO, Rh: --/--/O NEG (04/06 0740) Antibody: POS (04/06 0740) Rubella: 2.25 (10/27 1114) RPR: NON REAC (12/22 1420)  HBsAg: NEGATIVE (10/27 1114)  HIV: NONREACTIVE (12/22 1420)  GBS: NOT DETECTED (03/09 1429)   Assessment/Plan: 39 weeks.  Elective IOL for multiparity, favorable cervix.   HARPER,CHARLES A 01/03/2015, 9:09 AM

## 2015-01-03 NOTE — Progress Notes (Signed)
Brooke Carrillo is a 29 y.o. [id number] at 6462w2d by LMP admitted for induction of labor due to Elective at term.  Subjective:   Objective: BP 117/68 mmHg  Pulse 71  Ht 5\' 3"  (1.6 m)  Wt 202 lb (91.627 kg)  BMI 35.79 kg/m2  SpO2 100%  LMP 04/03/2014      FHT:  FHR: 150 bpm, variability: moderate,  accelerations:  Present,  decelerations:  Absent UC:   regular, every 2-4 minutes SVE:   Dilation: 5 Effacement (%): 70 Station: -2 Exam by:: J.Cox, RN  Labs: Lab Results  Component Value Date   WBC 25.2* 01/03/2015   HGB 9.8* 01/03/2015   HCT 28.3* 01/03/2015   MCV 91.0 01/03/2015   PLT 367 01/03/2015    Assessment / Plan: Spontaneous labor, progressing normally  Labor: Progressing normally Preeclampsia:  none Fetal Wellbeing:  Category I Pain Control:  Epidural I/D:  n/a Anticipated MOD:  NSVD  Branden Vine A 01/03/2015, 2:55 PM

## 2015-01-03 NOTE — Anesthesia Preprocedure Evaluation (Signed)
Anesthesia Evaluation  Patient identified by MRN, date of birth, ID band Patient awake    Reviewed: Allergy & Precautions, H&P , Patient's Chart, lab work & pertinent test results  Airway Mallampati: II TM Distance: >3 FB Neck ROM: full    Dental   Pulmonary Current Smoker,  breath sounds clear to auscultation        Cardiovascular Rhythm:regular Rate:Normal     Neuro/Psych    GI/Hepatic   Endo/Other    Renal/GU      Musculoskeletal   Abdominal   Peds  Hematology   Anesthesia Other Findings   Reproductive/Obstetrics (+) Pregnancy                           Anesthesia Physical Anesthesia Plan  ASA: II  Anesthesia Plan: Epidural   Post-op Pain Management:    Induction:   Airway Management Planned:   Additional Equipment:   Intra-op Plan:   Post-operative Plan:   Informed Consent: I have reviewed the patients History and Physical, chart, labs and discussed the procedure including the risks, benefits and alternatives for the proposed anesthesia with the patient or authorized representative who has indicated his/her understanding and acceptance.     Plan Discussed with:   Anesthesia Plan Comments:         Anesthesia Quick Evaluation  

## 2015-01-03 NOTE — Progress Notes (Signed)
Brooke Carrillo is a 29 y.o. [id number] at 8089w2d by ultrasound admitted for induction of labor due to Elective at term.  Subjective:   Objective: BP 123/68 mmHg  Pulse 78  Ht 5\' 3"  (1.6 m)  Wt 91.627 kg (202 lb)  BMI 35.79 kg/m2  LMP 04/03/2014      FHT:  FHR: 145 bpm, variability: moderate,  accelerations:  Present,  decelerations:  Absent UC:  mild irregular, roughly every 3-5 minutes SVE:   Dilation: 4 Effacement (%): 50 Station: -2 Exam by:: J.Cox, RN  Labs: Lab Results  Component Value Date   WBC 25.2* 01/03/2015   HGB 9.8* 01/03/2015   HCT 28.3* 01/03/2015   MCV 91.0 01/03/2015   PLT 367 01/03/2015    Assessment / Plan: Induction of labor due to term with favorable cervix,  progressing well on pitocin  Labor: Progressing normally Preeclampsia:  no signs or symptoms of toxicity Fetal Wellbeing:  Category I Pain Control:  Epidural I/D:  n/a Anticipated MOD:  NSVD  Denney, Rachelle A 01/03/2015, 9:07 AM

## 2015-01-04 LAB — CBC
HCT: 25.7 % — ABNORMAL LOW (ref 36.0–46.0)
Hemoglobin: 8.9 g/dL — ABNORMAL LOW (ref 12.0–15.0)
MCH: 31.3 pg (ref 26.0–34.0)
MCHC: 34.6 g/dL (ref 30.0–36.0)
MCV: 90.5 fL (ref 78.0–100.0)
Platelets: 356 10*3/uL (ref 150–400)
RBC: 2.84 MIL/uL — ABNORMAL LOW (ref 3.87–5.11)
RDW: 13.6 % (ref 11.5–15.5)
WBC: 22.5 10*3/uL — ABNORMAL HIGH (ref 4.0–10.5)

## 2015-01-04 MED ORDER — RHO D IMMUNE GLOBULIN 1500 UNIT/2ML IJ SOSY
300.0000 ug | PREFILLED_SYRINGE | Freq: Once | INTRAMUSCULAR | Status: AC
  Administered 2015-01-04: 300 ug via INTRAMUSCULAR
  Filled 2015-01-04: qty 2

## 2015-01-04 MED ORDER — PNEUMOCOCCAL VAC POLYVALENT 25 MCG/0.5ML IJ INJ
0.5000 mL | INJECTION | INTRAMUSCULAR | Status: AC
  Administered 2015-01-04: 0.5 mL via INTRAMUSCULAR
  Filled 2015-01-04: qty 0.5

## 2015-01-04 NOTE — Lactation Note (Signed)
This note was copied from the chart of Brooke Tama HighDeana Shearer. Lactation Consultation Note  Started bottle formula last night and she is continuing to feed this way for now. Patient Name: Brooke Carrillo Brooke Carrillo: 01/04/2015     Maternal Data    Feeding    LATCH Score/Interventions                      Lactation Tools Discussed/Used     Consult Status      Soyla DryerJoseph, Atiya Yera 01/04/2015, 2:50 PM

## 2015-01-04 NOTE — Anesthesia Postprocedure Evaluation (Signed)
Anesthesia Post Note  Patient: Brooke Carrillo  Procedure(s) Performed: * No procedures listed *  Anesthesia type: Epidural  Patient location: Mother/Baby  Post pain: Pain level controlled  Post assessment: Post-op Vital signs reviewed  Last Vitals:  Filed Vitals:   01/04/15 0000  BP: 121/68  Pulse: 79  Temp: 36.6 C  Resp: 20    Post vital signs: Reviewed  Level of consciousness: awake  Complications: No apparent anesthesia complications

## 2015-01-04 NOTE — Progress Notes (Signed)
Post Partum Day  #1 Subjective: no complaints, up ad lib, voiding and tolerating PO  Objective: Blood pressure 121/68, pulse 79, temperature 97.9 F (36.6 C), temperature source Oral, resp. rate 20, height 5\' 3"  (1.6 m), weight 91.627 kg (202 lb), last menstrual period 04/03/2014, SpO2 98 %, unknown if currently breastfeeding.  Physical Exam:  General: alert, cooperative and no distress Lochia: appropriate Uterine Fundus: firm Incision: none DVT Evaluation: No evidence of DVT seen on physical exam.   Recent Labs  01/03/15 0740 01/04/15 0600  HGB 9.8* 8.9*  HCT 28.3* 25.7*    Assessment/Plan: Plan for discharge tomorrow and Lactation consult   LOS: 1 day   Brooke Carrillo, Brooke Carrillo 01/04/2015, 8:43 AM

## 2015-01-05 ENCOUNTER — Encounter (HOSPITAL_COMMUNITY): Payer: Self-pay

## 2015-01-05 LAB — RH IG WORKUP (INCLUDES ABO/RH)
ABO/RH(D): O NEG
Fetal Screen: NEGATIVE
Gestational Age(Wks): 39.2
Unit division: 0

## 2015-01-05 MED ORDER — BENZOCAINE-MENTHOL 20-0.5 % EX AERO: 1.0000 "application " | INHALATION_SPRAY | CUTANEOUS | Status: DC | PRN

## 2015-01-05 MED ORDER — IBUPROFEN 600 MG PO TABS: 600.0000 mg | ORAL_TABLET | Freq: Four times a day (QID) | ORAL | Status: DC

## 2015-01-05 MED ORDER — OXYCODONE-ACETAMINOPHEN 5-325 MG PO TABS: 2.0000 | ORAL_TABLET | ORAL | Status: DC | PRN

## 2015-01-05 MED ORDER — SENNOSIDES-DOCUSATE SODIUM 8.6-50 MG PO TABS: 2.0000 | ORAL_TABLET | ORAL | Status: DC

## 2015-01-05 MED ORDER — DIBUCAINE 1 % RE OINT: 1.0000 "application " | TOPICAL_OINTMENT | RECTAL | Status: DC | PRN

## 2015-01-05 MED ORDER — WITCH HAZEL-GLYCERIN EX PADS: 1.0000 "application " | MEDICATED_PAD | CUTANEOUS | Status: DC | PRN

## 2015-01-05 NOTE — Discharge Instructions (Signed)

## 2015-01-05 NOTE — Discharge Summary (Signed)
Obstetric Discharge Summary Reason for Admission: induction of labor Prenatal Procedures: none Intrapartum Procedures: spontaneous vaginal delivery Postpartum Procedures: none Complications-Operative and Postpartum: none HEMOGLOBIN  Date Value Ref Range Status  01/04/2015 8.9* 12.0 - 15.0 g/dL Final   HCT  Date Value Ref Range Status  01/04/2015 25.7* 36.0 - 46.0 % Final    Physical Exam:  General: alert, cooperative and no distress Lochia: appropriate Uterine Fundus: firm Incision: none DVT Evaluation: No evidence of DVT seen on physical exam.  Discharge Diagnoses: Term Pregnancy-delivered  Discharge Information: Date: 01/05/2015 Activity: pelvic rest Diet: routine Medications: PNV, Ibuprofen, Colace, Iron and Percocet Condition: stable Instructions: refer to practice specific booklet Discharge to: home   Newborn Data: Live born female  Birth Weight: 7 lb 11.1 oz (3490 g) APGAR: 9, 9  Home with mother.  Orvilla CornwallDenney, Kou Gucciardo A 01/05/2015, 8:20 AM

## 2015-01-07 LAB — TYPE AND SCREEN
ABO/RH(D): O NEG
Antibody Screen: POSITIVE
DAT, IgG: NEGATIVE
Unit division: 0
Unit division: 0

## 2015-01-17 ENCOUNTER — Ambulatory Visit (INDEPENDENT_AMBULATORY_CARE_PROVIDER_SITE_OTHER): Payer: Medicaid Other | Admitting: Obstetrics

## 2015-01-17 ENCOUNTER — Encounter: Payer: Self-pay | Admitting: Obstetrics

## 2015-01-17 DIAGNOSIS — R52 Pain, unspecified: Secondary | ICD-10-CM

## 2015-01-17 DIAGNOSIS — O9089 Other complications of the puerperium, not elsewhere classified: Secondary | ICD-10-CM

## 2015-01-17 DIAGNOSIS — Z30014 Encounter for initial prescription of intrauterine contraceptive device: Secondary | ICD-10-CM

## 2015-01-17 MED ORDER — OXYCODONE-ACETAMINOPHEN 10-325 MG PO TABS: 1.0000 | ORAL_TABLET | ORAL | Status: DC | PRN

## 2015-01-17 NOTE — Progress Notes (Signed)
Subjective:     Brooke Carrillo is a 29 y.o. female who presents for a postpartum visit. She is 2 weeks postpartum following a spontaneous vaginal delivery. I have fully reviewed the prenatal and intrapartum course. The delivery was at 39 gestational weeks. Outcome: spontaneous vaginal delivery. Anesthesia: epidural. Postpartum course has been normal. Baby's course has been normal. Baby is feeding by bottle - Similac Advance. Bleeding thin lochia. Bowel function is normal. Bladder function is normal. Patient is not sexually active. Contraception method is abstinence. Postpartum depression screening: negative.  Tobacco, alcohol and substance abuse history reviewed.  Adult immunizations reviewed including TDAP, rubella and varicella.  The following portions of the patient's history were reviewed and updated as appropriate: allergies, current medications, past family history, past medical history, past social history, past surgical history and problem list.  Review of Systems A comprehensive review of systems was negative.   Objective:    BP 123/76 mmHg  Pulse 79  Temp(Src) 98 F (36.7 C)  Wt 180 lb (81.647 kg)  LMP 04/03/2014    100% of 10 min visit spent on counseling and coordination of care.   Assessment:    2 weeks postpartum.  Doing well.  Contraceptive Management  Plan:    1. Contraception: IUD 2. Mirena IUD Rx 3. Follow up in: 6 weeks or as needed.   Healthy lifestyle practices reviewed

## 2015-02-06 ENCOUNTER — Telehealth: Payer: Self-pay | Admitting: *Deleted

## 2015-02-06 ENCOUNTER — Other Ambulatory Visit: Payer: Self-pay | Admitting: *Deleted

## 2015-02-06 DIAGNOSIS — N920 Excessive and frequent menstruation with regular cycle: Secondary | ICD-10-CM

## 2015-02-06 MED ORDER — LEVONORGESTREL-ETHINYL ESTRAD 0.15-30 MG-MCG PO TABS: ORAL_TABLET | ORAL | Status: DC

## 2015-02-06 NOTE — Telephone Encounter (Signed)
error 

## 2015-02-06 NOTE — Telephone Encounter (Signed)
Patient states she is bleeding heavy and painful since Sunday. 2:23 Patient is having her first cycle since delivery and it is heavy and painful. Patient is planning a Mirena at her 6 week check- and is not using any form of hormonal therapy at this time. Patient is a little nerveous about the flow.

## 2015-02-09 NOTE — Telephone Encounter (Signed)
Needs appt

## 2015-02-13 ENCOUNTER — Telehealth: Payer: Self-pay | Admitting: *Deleted

## 2015-02-13 NOTE — Telephone Encounter (Signed)
Patient states she has started bleeding again even before she finished the nordette given q8 hours to stop her bleeding. Patient has an appointment tomorrow for her Mirena. Told patient to keep her appointment- and be sure and discuss her bleeding profile with the provider.

## 2015-02-14 ENCOUNTER — Encounter: Payer: Self-pay | Admitting: Certified Nurse Midwife

## 2015-02-14 ENCOUNTER — Ambulatory Visit (INDEPENDENT_AMBULATORY_CARE_PROVIDER_SITE_OTHER): Payer: Medicaid Other | Admitting: Certified Nurse Midwife

## 2015-02-14 VITALS — BP 115/73 | HR 70 | Temp 97.9°F | Ht 63.0 in | Wt 184.0 lb

## 2015-02-14 DIAGNOSIS — Z01812 Encounter for preprocedural laboratory examination: Secondary | ICD-10-CM

## 2015-02-14 DIAGNOSIS — Z30430 Encounter for insertion of intrauterine contraceptive device: Secondary | ICD-10-CM | POA: Diagnosis not present

## 2015-02-14 DIAGNOSIS — Z720 Tobacco use: Secondary | ICD-10-CM

## 2015-02-14 DIAGNOSIS — Z716 Tobacco abuse counseling: Secondary | ICD-10-CM

## 2015-02-14 LAB — POCT URINE PREGNANCY: Preg Test, Ur: NEGATIVE

## 2015-02-14 MED ORDER — VARENICLINE TARTRATE 0.5 MG X 11 & 1 MG X 42 PO MISC: ORAL | Status: DC

## 2015-02-14 NOTE — Progress Notes (Signed)
Patient ID: Brooke Carrillo, female   DOB: 01-03-1986, 29 y.o.   MRN: [id number]  Subjective:     Brooke SeraDeana M Purtle is a 29 y.o. female who presents for a postpartum visit. She is 6 weeks postpartum following a spontaneous vaginal delivery. I have fully reviewed the prenatal and intrapartum course. The delivery was at 39 gestational weeks. Outcome: spontaneous vaginal delivery. Anesthesia: epidural. Postpartum course has been normal. Baby's course has been normal. Baby is feeding by bottle - Similac Advance. Bleeding thin lochia. Bowel function is normal. Bladder function is normal. Patient is not sexually active. Contraception method is IUD today. Postpartum depression screening: negative.  Tobacco, alcohol and substance abuse history reviewed.  Adult immunizations reviewed including TDAP, rubella and varicella.  The following portions of the patient's history were reviewed and updated as appropriate: allergies, current medications, past family history, past medical history, past social history, past surgical history and problem list.  Review of Systems A comprehensive review of systems was negative.   Objective:    BP 115/73 mmHg  Pulse 70  Temp(Src) 97.9 F (36.6 C)  Ht 5\' 3"  (1.6 m)  Wt 83.462 kg (184 lb)  BMI 32.60 kg/m2  Breastfeeding? No  General:  alert, cooperative and no distress   Breasts:  inspection negative, no nipple discharge or bleeding, no masses or nodularity palpable  Lungs: clear to auscultation bilaterally  Heart:  regular rate and rhythm, S1, S2 normal, no murmur, click, rub or gallop  Abdomen: soft, non-tender; bowel sounds normal; no masses,  no organomegaly   Vulva:  normal  Vagina: normal vagina, no discharge, exudate, lesion, or erythema  Cervix:  no lesions  Corpus: normal  Adnexa:  normal adnexa  Rectal Exam: Not performed.          50% of 30 min visit spent on counseling and coordination of care.  Assessment:     Normal 6 week postpartum exam. Pap smear done  at today's visit.   IUD insertion Smoking Cessation  Plan:    1. Contraception: IUD 2. Smoking Cessation counseling: Chantix 3. Follow up in: 1 month for string check or as needed.  Preconception counseling provided Healthy lifestyle practices reviewed  IUD Procedure Note   DIAGNOSIS: Desires long-term, reversible contraception   PROCEDURE: IUD placement Performing Provider: Orvilla Cornwallachelle Tillmon Kisling CNM  Patient counseled prior to procedure. I explained risks and benefits of Mirena IUD, reviewed alternative forms of contraception. Patient stated understanding and consented to continue with procedure.   LMP: Currently on cycle, started on Monday after 2 weeks Pregnancy Test: Negative Lot #: [id number] Expiration Date: 06/18   IUD type: [X]  Mirena   [   ] Paraguard    PROCEDURE:  Timeout procedure was performed to ensure right patient and right site.  A bimanual exam was performed to determine the position of the uterus, retroverted. The speculum was placed. The vagina and cervix was sterilized in the usual manner and sterile technique was maintained throughout the course of the procedure. A single toothed tenaculum was applied to the posterior lip of the cervix and gentle traction applied. The depth of the uterus was sounded to 9 cm. With gentle traction on the tenaculum, the IUD was inserted to the appropriate depth and inserted without difficulty.  The string was cut to an estimated 4 cm length. Bleeding was minimal. The patient tolerated the procedure well.   Follow up: The patient tolerated the procedure well without complications.  Standard post-procedure care is explained and return  precautions are given.  F/U appointment in 1 month for string check  Orvilla Cornwallachelle Gentry Pilson CNM

## 2015-02-15 ENCOUNTER — Other Ambulatory Visit: Payer: Medicaid Other

## 2015-02-15 DIAGNOSIS — Z3200 Encounter for pregnancy test, result unknown: Secondary | ICD-10-CM

## 2015-02-15 NOTE — Telephone Encounter (Signed)
Patient was in the office 5/18

## 2015-02-16 LAB — PAP IG W/ RFLX HPV ASCU

## 2015-02-16 LAB — HCG, QUANTITATIVE, PREGNANCY: hCG, Beta Chain, Quant, S: <2 m[IU]/mL

## 2015-03-14 ENCOUNTER — Ambulatory Visit (INDEPENDENT_AMBULATORY_CARE_PROVIDER_SITE_OTHER): Payer: Medicaid Other | Admitting: Certified Nurse Midwife

## 2015-03-14 ENCOUNTER — Encounter: Payer: Self-pay | Admitting: Certified Nurse Midwife

## 2015-03-14 VITALS — BP 123/77 | HR 85 | Temp 98.3°F | Wt 177.0 lb

## 2015-03-14 DIAGNOSIS — R1030 Lower abdominal pain, unspecified: Secondary | ICD-10-CM

## 2015-03-14 DIAGNOSIS — N9489 Other specified conditions associated with female genital organs and menstrual cycle: Secondary | ICD-10-CM | POA: Diagnosis not present

## 2015-03-14 MED ORDER — ALBUTEROL SULFATE HFA 108 (90 BASE) MCG/ACT IN AERS: 2.0000 | INHALATION_SPRAY | Freq: Four times a day (QID) | RESPIRATORY_TRACT | Status: DC | PRN

## 2015-03-14 MED ORDER — FLIBANSERIN 100 MG PO TABS: 1.0000 | ORAL_TABLET | Freq: Every day | ORAL | Status: DC

## 2015-03-14 NOTE — Progress Notes (Signed)
Patient ID: Brooke Carrillo, female   DOB: 06/02/1986, 29 y.o.   MRN: [id number]   Chief Complaint  Patient presents with  . Contraception    iud check, pt having pain    HPI Brooke Carrillo is a 29 y.o. female.  Here for IUD string check after insertion. Also, has complaints of abdominal pain.  Reports blood in her stool, denies constipation or hemorrhoids.  Denies fever.  States occasional vomiting with sharp lower abdominal pains. Bleeding is minimal, brown discharge currently.  Smoking cessation, down to 3 cigaretts/day.     HPI  Past Medical History  Diagnosis Date  . No pertinent past medical history   . Pregnancy as incidental finding   . Medical history non-contributory     Past Surgical History  Procedure Laterality Date  . Wisdom tooth extraction    . Orif finger / thumb fracture      Family History  Problem Relation Age of Onset  . Hypertension Father   . Dementia Mother     Social History History  Substance Use Topics  . Smoking status: Current Every Day Smoker -- 0.50 packs/day  . Smokeless tobacco: Never Used  . Alcohol Use: No    No Known Allergies  Current Outpatient Prescriptions  Medication Sig Dispense Refill  . butalbital-acetaminophen-caffeine (FIORICET) 50-325-40 MG per tablet Take 2 tablets by mouth every 6 (six) hours as needed for headache. (Patient not taking: Reported on 01/03/2015) 40 tablet 2  . dibucaine (NUPERCAINAL) 1 % OINT Place 1 application rectally as needed for hemorrhoids. (Patient not taking: Reported on 01/17/2015) 28 g 0  . Flibanserin (ADDYI) 100 MG TABS Take 1 tablet by mouth daily. 30 tablet 4  . ibuprofen (ADVIL,MOTRIN) 600 MG tablet Take 1 tablet (600 mg total) by mouth every 6 (six) hours. (Patient not taking: Reported on 03/14/2015) 90 tablet 2  . Prenat-FeCbn-FeAspGl-FA-Omega (OB COMPLETE PETITE) 35-5-1-200 MG CAPS Take 1 capsule by mouth daily. (Patient not taking: Reported on 02/14/2015) 30 capsule 11  . varenicline (CHANTIX  STARTING MONTH PAK) 0.5 MG X 11 & 1 MG X 42 tablet Take one 0.5 mg tablet by mouth once daily for 3 days, then increase to one 0.5 mg tablet twice daily for 4 days, then increase to one 1 mg tablet twice daily. (Patient not taking: Reported on 03/14/2015) 53 tablet 0   No current facility-administered medications for this visit.    Review of Systems Review of Systems Constitutional: negative for fatigue and weight loss Respiratory: negative for cough and wheezing Cardiovascular: negative for chest pain, fatigue and palpitations Gastrointestinal: negative for change in bowel habits, + abdominal pain.  Genitourinary:negative Integument/breast: negative for nipple discharge Musculoskeletal:negative for myalgias Neurological: negative for gait problems and tremors Behavioral/Psych: negative for abusive relationship, depression Endocrine: negative for temperature intolerance     Blood pressure 123/77, pulse 85, temperature 98.3 F (36.8 C), weight 177 lb (80.287 kg), not currently breastfeeding.  Physical Exam Physical Exam General:   alert  Skin:   no rash or abnormalities  Lungs:   clear on right side, lower left quadrant lobe wheezing  Heart:   regular rate and rhythm, S1, S2 normal, no murmur, click, rub or gallop  Breasts:   deferred  Abdomen:  normal findings: no organomegaly, soft, non-tender and no hernia  Pelvis:  External genitalia: normal general appearance Urinary system: urethral meatus normal and bladder without fullness, nontender Vaginal: normal without tenderness, induration or masses Cervix: no CMT, strings present Adnexa: normal  bimanual exam, tender bilaterally Uterus: anteverted and non-tender, normal size    50% of 15 min visit spent on counseling and coordination of care.   Data Reviewed Previous medical hx, labs, meds  Assessment     HSSD Lower abdominal pain bilateral IUD strings present  Wheezing    Plan    Orders Placed This Encounter   Procedures  . US Transvaginal Non-OB    Standing Status: Future     Number of Occurrences:      Standing Expiration Date: 05/13/2016    Order Specific Question:  Reason for Exam (SYMPTOM  OR DIAGNOSIS REQUIRED)    Answer:  bilateral lower abdominal pain with IUD    Order Specific Question:  Preferred imaging location?    Answer:  Internal  . Ambulatory referral to Gastroenterology    Referral Priority:  Routine    Referral Type:  Consultation    Referral Reason:  Specialty Services Required    Number of Visits Requested:  1   Meds ordered this encounter  Medications  . Flibanserin (ADDYI) 100 MG TABS    Sig: Take 1 tablet by mouth daily.    Dispense:  30 tablet    Refill:  4    Possible management options include: physical therapy for abdominal pain Follow up after ultrasound

## 2015-03-15 ENCOUNTER — Telehealth: Payer: Self-pay

## 2015-03-15 NOTE — Telephone Encounter (Signed)
CALLED PATIENT TO LET HER KNOW THAT U/S IS SCHEDULED FOR 2:45PM ON 6/23 AND WILL SEE [facility]ACHELLE AFTERWARDS, ALSO [facility] WILL CALL HER DIRECTLY FOR APPT AND [facility] AS WELL - FAXED INFO TO THEM TO GET IN TOUCH WITH HER PER THEIR INSTRUCTIONS

## 2015-03-16 ENCOUNTER — Telehealth: Payer: Self-pay

## 2015-03-16 NOTE — Telephone Encounter (Signed)
let patient know that PCP on card had to refer her  [facility] - looked into others as well and got same answer - will need to change card to a PCP or to [place] to refer

## 2015-03-16 NOTE — Telephone Encounter (Signed)
[facility] has patient's info and they call her and patient said she was thinking about it and would get back to them

## 2015-03-22 ENCOUNTER — Encounter: Payer: Self-pay | Admitting: Certified Nurse Midwife

## 2015-03-22 ENCOUNTER — Other Ambulatory Visit: Payer: Self-pay | Admitting: Certified Nurse Midwife

## 2015-03-22 ENCOUNTER — Ambulatory Visit (INDEPENDENT_AMBULATORY_CARE_PROVIDER_SITE_OTHER): Payer: Medicaid Other

## 2015-03-22 ENCOUNTER — Ambulatory Visit (INDEPENDENT_AMBULATORY_CARE_PROVIDER_SITE_OTHER): Payer: Medicaid Other | Admitting: Certified Nurse Midwife

## 2015-03-22 DIAGNOSIS — R1030 Lower abdominal pain, unspecified: Secondary | ICD-10-CM | POA: Diagnosis not present

## 2015-03-22 MED ORDER — ONDANSETRON HCL 4 MG PO TABS: 4.0000 mg | ORAL_TABLET | Freq: Three times a day (TID) | ORAL | Status: DC | PRN

## 2015-03-22 MED ORDER — CYCLOBENZAPRINE HCL 10 MG PO TABS: 10.0000 mg | ORAL_TABLET | Freq: Three times a day (TID) | ORAL | Status: DC | PRN

## 2015-03-22 MED ORDER — SIMETHICONE 125 MG PO CAPS
1.0000 | ORAL_CAPSULE | ORAL | Status: DC | PRN
Start: 2015-03-22 — End: 2016-03-04

## 2015-03-22 MED ORDER — TRAMADOL HCL 50 MG PO TABS: 50.0000 mg | ORAL_TABLET | Freq: Four times a day (QID) | ORAL | Status: DC | PRN

## 2015-03-22 NOTE — Progress Notes (Signed)
Patient ID: Brooke Carrillo, female   DOB: 1986-06-23, 29 y.o.   MRN: [id number]   Chief Complaint  Patient presents with  . Follow-up    u/s today    HPI Brooke Carrillo is a 29 y.o. female.  Here for F/U on ultrasound done today.  Still having severe abdominal pain with vomiting.    HPI  Past Medical History  Diagnosis Date  . No pertinent past medical history   . Pregnancy as incidental finding   . Medical history non-contributory     Past Surgical History  Procedure Laterality Date  . Wisdom tooth extraction    . Orif finger / thumb fracture      Family History  Problem Relation Age of Onset  . Hypertension Father   . Dementia Mother     Social History History  Substance Use Topics  . Smoking status: Current Every Day Smoker -- 0.50 packs/day  . Smokeless tobacco: Never Used  . Alcohol Use: No    No Known Allergies  Current Outpatient Prescriptions  Medication Sig Dispense Refill  . albuterol (PROVENTIL HFA;VENTOLIN HFA) 108 (90 BASE) MCG/ACT inhaler Inhale 2 puffs into the lungs every 6 (six) hours as needed for wheezing or shortness of breath. 1 Inhaler 2  . butalbital-acetaminophen-caffeine (FIORICET) 50-325-40 MG per tablet Take 2 tablets by mouth every 6 (six) hours as needed for headache. (Patient not taking: Reported on 01/03/2015) 40 tablet 2  . cyclobenzaprine (FLEXERIL) 10 MG tablet Take 1 tablet (10 mg total) by mouth 3 (three) times daily as needed for muscle spasms. 50 tablet 2  . dibucaine (NUPERCAINAL) 1 % OINT Place 1 application rectally as needed for hemorrhoids. (Patient not taking: Reported on 01/17/2015) 28 g 0  . Flibanserin (ADDYI) 100 MG TABS Take 1 tablet by mouth daily. 30 tablet 4  . ibuprofen (ADVIL,MOTRIN) 600 MG tablet Take 1 tablet (600 mg total) by mouth every 6 (six) hours. (Patient not taking: Reported on 03/14/2015) 90 tablet 2  . ondansetron (ZOFRAN) 4 MG tablet Take 1 tablet (4 mg total) by mouth every 8 (eight) hours as needed for  nausea or vomiting. 20 tablet 0  . Prenat-FeCbn-FeAspGl-FA-Omega (OB COMPLETE PETITE) 35-5-1-200 MG CAPS Take 1 capsule by mouth daily. (Patient not taking: Reported on 02/14/2015) 30 capsule 11  . Simethicone 125 MG CAPS Take 1 capsule (125 mg total) by mouth every 4 (four) hours as needed. 90 each 2  . traMADol (ULTRAM) 50 MG tablet Take 1 tablet (50 mg total) by mouth every 6 (six) hours as needed. 60 tablet 2  . varenicline (CHANTIX STARTING MONTH PAK) 0.5 MG X 11 & 1 MG X 42 tablet Take one 0.5 mg tablet by mouth once daily for 3 days, then increase to one 0.5 mg tablet twice daily for 4 days, then increase to one 1 mg tablet twice daily. (Patient not taking: Reported on 03/14/2015) 53 tablet 0   No current facility-administered medications for this visit.    Review of Systems Review of Systems Constitutional: negative for fatigue and weight loss Respiratory: negative for cough and wheezing Cardiovascular: negative for chest pain, fatigue and palpitations Gastrointestinal: + for abdominal pain and change in bowel habits Genitourinary:negative Integument/breast: negative for nipple discharge Musculoskeletal:negative for myalgias Neurological: negative for gait problems and tremors Behavioral/Psych: negative for abusive relationship, depression Endocrine: negative for temperature intolerance     Blood pressure 114/68, pulse 79, temperature 98.4 F (36.9 C), weight 180 lb (81.647 kg), not currently breastfeeding.  Physical Exam Physical Exam General:   alert  Skin:   no rash or abnormalities  Lungs:   clear to auscultation bilaterally  Heart:   regular rate and rhythm, S1, S2 normal, no murmur, click, rub or gallop  Breasts:   deferred  Abdomen:  normal findings: no organomegaly, soft, non-tender and no hernia  Pelvis:  deferred   Ultrasound:  IUD in place, no free fluid. No abnormalities noted.  Large amount of gas noted on ultrasound.  50% of 15 min visit spent on counseling and  coordination of care.   Data Reviewed Previous medical hx, labs, meds  Assessment     Abdominal pain with unknown eitiology     Plan   Keep appointment with GI for July.  Consider F/U in August if symptoms are not improved.  No orders of the defined types were placed in this encounter.   No orders of the defined types were placed in this encounter.

## 2015-05-02 ENCOUNTER — Ambulatory Visit: Payer: Medicaid Other | Admitting: Certified Nurse Midwife

## 2015-05-03 ENCOUNTER — Ambulatory Visit: Payer: Medicaid Other | Admitting: Certified Nurse Midwife

## 2015-07-11 ENCOUNTER — Encounter (HOSPITAL_COMMUNITY): Payer: Self-pay | Admitting: Family Medicine

## 2015-07-11 ENCOUNTER — Emergency Department (HOSPITAL_COMMUNITY)
Admission: EM | Admit: 2015-07-11 | Discharge: 2015-07-11 | Disposition: A | Discharge status: Home / Self Care | Payer: Self-pay | Attending: Emergency Medicine | Admitting: Emergency Medicine

## 2015-07-11 ENCOUNTER — Emergency Department (HOSPITAL_COMMUNITY): Payer: Medicaid Other

## 2015-07-11 DIAGNOSIS — R112 Nausea with vomiting, unspecified: Secondary | ICD-10-CM | POA: Insufficient documentation

## 2015-07-11 DIAGNOSIS — R110 Nausea: Secondary | ICD-10-CM

## 2015-07-11 DIAGNOSIS — Z79899 Other long term (current) drug therapy: Secondary | ICD-10-CM | POA: Insufficient documentation

## 2015-07-11 DIAGNOSIS — K5900 Constipation, unspecified: Secondary | ICD-10-CM | POA: Insufficient documentation

## 2015-07-11 DIAGNOSIS — R1011 Right upper quadrant pain: Secondary | ICD-10-CM | POA: Insufficient documentation

## 2015-07-11 DIAGNOSIS — Z720 Tobacco use: Secondary | ICD-10-CM | POA: Insufficient documentation

## 2015-07-11 DIAGNOSIS — R1031 Right lower quadrant pain: Secondary | ICD-10-CM | POA: Insufficient documentation

## 2015-07-11 DIAGNOSIS — Z3202 Encounter for pregnancy test, result negative: Secondary | ICD-10-CM | POA: Insufficient documentation

## 2015-07-11 LAB — URINALYSIS, ROUTINE W REFLEX MICROSCOPIC
Bilirubin Urine: NEGATIVE
Glucose, UA: NEGATIVE mg/dL
Hgb urine dipstick: NEGATIVE
Ketones, ur: NEGATIVE mg/dL
Leukocytes, UA: NEGATIVE
Nitrite: NEGATIVE
Protein, ur: NEGATIVE mg/dL
Specific Gravity, Urine: 1.014 (ref 1.005–1.030)
Urobilinogen, UA: 0.2 mg/dL (ref 0.0–1.0)
pH: 7 (ref 5.0–8.0)

## 2015-07-11 LAB — COMPREHENSIVE METABOLIC PANEL
ALT: 11 U/L — ABNORMAL LOW (ref 14–54)
AST: 15 U/L (ref 15–41)
Albumin: 4.2 g/dL (ref 3.5–5.0)
Alkaline Phosphatase: 63 U/L (ref 38–126)
Anion gap: 10 (ref 5–15)
BUN: 10 mg/dL (ref 6–20)
CO2: 23 mmol/L (ref 22–32)
Calcium: 9.4 mg/dL (ref 8.9–10.3)
Chloride: 103 mmol/L (ref 101–111)
Creatinine, Ser: 0.81 mg/dL (ref 0.44–1.00)
GFR calc Af Amer: >60 mL/min (ref >60)
GFR calc non Af Amer: >60 mL/min (ref >60)
Glucose, Bld: 116 mg/dL — ABNORMAL HIGH (ref 65–99)
Potassium: 4.2 mmol/L (ref 3.5–5.1)
Sodium: 136 mmol/L (ref 135–145)
Total Bilirubin: 0.6 mg/dL (ref 0.3–1.2)
Total Protein: 7.3 g/dL (ref 6.5–8.1)

## 2015-07-11 LAB — CBC
HCT: 41.3 % (ref 36.0–46.0)
Hemoglobin: 14 g/dL (ref 12.0–15.0)
MCH: 32.2 pg (ref 26.0–34.0)
MCHC: 33.9 g/dL (ref 30.0–36.0)
MCV: 94.9 fL (ref 78.0–100.0)
Platelets: 360 10*3/uL (ref 150–400)
RBC: 4.35 MIL/uL (ref 3.87–5.11)
RDW: 12.9 % (ref 11.5–15.5)
WBC: 13.4 10*3/uL — ABNORMAL HIGH (ref 4.0–10.5)

## 2015-07-11 LAB — I-STAT BETA HCG BLOOD, ED (MC, WL, AP ONLY): I-stat hCG, quantitative: <5 m[IU]/mL (ref <5)

## 2015-07-11 LAB — LIPASE, BLOOD: Lipase: 26 U/L (ref 22–51)

## 2015-07-11 MED ORDER — IOHEXOL 300 MG/ML  SOLN: 25.0000 mL | Freq: Once | INTRAMUSCULAR | Status: DC | PRN

## 2015-07-11 MED ORDER — FAMOTIDINE 20 MG PO TABS: 20.0000 mg | ORAL_TABLET | Freq: Two times a day (BID) | ORAL | Status: DC

## 2015-07-11 MED ORDER — ONDANSETRON HCL 4 MG/2ML IJ SOLN
4.0000 mg | Freq: Once | INTRAMUSCULAR | Status: AC
  Administered 2015-07-11: 4 mg via INTRAVENOUS
  Filled 2015-07-11: qty 2

## 2015-07-11 MED ORDER — IOHEXOL 300 MG/ML  SOLN
100.0000 mL | Freq: Once | INTRAMUSCULAR | Status: AC | PRN
  Administered 2015-07-11: 100 mL via INTRAVENOUS

## 2015-07-11 MED ORDER — SODIUM CHLORIDE 0.9 % IV SOLN
INTRAVENOUS | Status: DC
  Administered 2015-07-11: 17:00:00 via INTRAVENOUS

## 2015-07-11 MED ORDER — PROMETHAZINE HCL 12.5 MG PO TABS: 12.5000 mg | ORAL_TABLET | Freq: Four times a day (QID) | ORAL | Status: DC | PRN

## 2015-07-11 NOTE — Discharge Instructions (Signed)
Your CT scan today was normal. [facility]. Return here as needed.

## 2015-07-11 NOTE — ED Notes (Signed)
IV attempted by Amy, RN and Clydie BraunKaren, RN without success.  Resident at bedside for [place] IV.

## 2015-07-11 NOTE — ED Provider Notes (Signed)
CSN: [id number]     Arrival date & time 07/11/15  1335 History  By signing my name below, I, Tanda RockersMargaux Venter, attest that this documentation has been prepared under the direction and in the presence of Kerrie BuffaloHope Hyde Sires, NP. Electronically Signed: Tanda RockersMargaux Venter, ED Scribe. 07/11/2015. 3:32 PM.  Chief Complaint  Patient presents with  . Abdominal Pain   Patient is a 29 y.o. female presenting with abdominal pain. The history is provided by the patient. No language interpreter was used.  Abdominal Pain Pain location:  RLQ Pain quality: burning   Pain radiates to:  Does not radiate Pain severity:  Moderate Onset quality:  Sudden Duration:  6 days Timing:  Constant Chronicity:  Recurrent Context: not suspicious food intake   Ineffective treatments:  NSAIDs Associated symptoms: constipation, nausea and vomiting   Associated symptoms: no chills, no fever, no vaginal bleeding and no vaginal discharge      HPI Comments: Brooke Carrillo is a 29 y.o. female who presents to the Emergency Department complaining of sudden onset, intermittent, burning, RLQ abdominal pain x 6 months, constant for the past 5 days. The pain lasts approximately 2 weeks before resolving on its own. The pain is exacerbated with eating but pt denies eating spicy foods. The abdominal pain is associated with nausea, vomiting, and varying diarrhea. Pt states that when her symptoms began she was having diarrhea but she has been having constipation for the past 1.5 months. Pt has been performing enemas and Mirilax without relief. Pt has also been taking Ibuprofen for the pain without relief. Her last bowel movement was approximately 1 week ago which she states was "runny." Pt was seen by her OBGYN 6 months ago when the symptoms first began and was trying to get scheduled for a colonoscopy but her insurance ran out before she could get it done. Pt denies fever, chills, vaginal discharge, or any other associated symptoms.   Past Medical History   Diagnosis Date  . No pertinent past medical history   . Pregnancy as incidental finding   . Medical history non-contributory    Past Surgical History  Procedure Laterality Date  . Wisdom tooth extraction    . Orif finger / thumb fracture     Family History  Problem Relation Age of Onset  . Hypertension Father   . Dementia Mother    Social History  Substance Use Topics  . Smoking status: Current Every Day Smoker -- 0.50 packs/day  . Smokeless tobacco: Never Used  . Alcohol Use: No   OB History    Gravida Para Term Preterm AB TAB SAB Ectopic Multiple Living   6 3 3  0 3 1 2  0 0 3     Review of Systems  Constitutional: Negative for fever and chills.  Gastrointestinal: Positive for nausea, vomiting, abdominal pain and constipation.  Genitourinary: Negative for vaginal bleeding and vaginal discharge.  All other systems reviewed and are negative.  Allergies  Review of patient's allergies indicates no known allergies.  Home Medications   Prior to Admission medications   Medication Sig Start Date End Date Taking? Authorizing Provider  albuterol (PROVENTIL HFA;VENTOLIN HFA) 108 (90 BASE) MCG/ACT inhaler Inhale 2 puffs into the lungs every 6 (six) hours as needed for wheezing or shortness of breath. 03/14/15   Rachelle A Denney, CNM  cyclobenzaprine (FLEXERIL) 10 MG tablet Take 1 tablet (10 mg total) by mouth 3 (three) times daily as needed for muscle spasms. 03/22/15   Roe Coombsachelle A Denney, CNM  famotidine (PEPCID) 20 MG tablet Take 1 tablet (20 mg total) by mouth 2 (two) times daily. 07/11/15   Zuri Bradway Orlene Och, NP  Flibanserin (ADDYI) 100 MG TABS Take 1 tablet by mouth daily. 03/14/15   Rachelle A Denney, CNM  ondansetron (ZOFRAN) 4 MG tablet Take 1 tablet (4 mg total) by mouth every 8 (eight) hours as needed for nausea or vomiting. 03/22/15   Rachelle A Denney, CNM  Prenat-FeCbn-FeAspGl-FA-Omega (OB COMPLETE PETITE) 35-5-1-200 MG CAPS Take 1 capsule by mouth daily. Patient not taking:  Reported on 02/14/2015 10/10/14   Brock Bad, MD  promethazine (PHENERGAN) 12.5 MG tablet Take 1 tablet (12.5 mg total) by mouth every 6 (six) hours as needed for nausea or vomiting. 07/11/15   Maxemiliano Riel Orlene Och, NP  Simethicone 125 MG CAPS Take 1 capsule (125 mg total) by mouth every 4 (four) hours as needed. 03/22/15   Rachelle A Denney, CNM  traMADol (ULTRAM) 50 MG tablet Take 1 tablet (50 mg total) by mouth every 6 (six) hours as needed. 03/22/15   Rachelle A Denney, CNM  varenicline (CHANTIX STARTING MONTH PAK) 0.5 MG X 11 & 1 MG X 42 tablet Take one 0.5 mg tablet by mouth once daily for 3 days, then increase to one 0.5 mg tablet twice daily for 4 days, then increase to one 1 mg tablet twice daily. Patient not taking: Reported on 03/14/2015 02/14/15   Roe Coombs, CNM   Triage Vitals: BP 119/66 mmHg  Pulse 74  Temp(Src) 98.1 F (36.7 C)  Resp 18  SpO2 99%  LMP 07/11/2015   Physical Exam  Constitutional: She appears well-developed and well-nourished. No distress.  HENT:  Head: Normocephalic and atraumatic.  Eyes: Conjunctivae and EOM are normal.  Neck: Normal range of motion. Neck supple.  Cardiovascular: Normal rate and regular rhythm.   Pulmonary/Chest: Effort normal and breath sounds normal. She has no wheezes.  Abdominal: Soft. Bowel sounds are normal. There is tenderness in the right upper quadrant and right lower quadrant. There is no rebound, no guarding and no CVA tenderness.  Musculoskeletal: Normal range of motion.  Neurological: She is alert.  Skin: Skin is warm and dry.  Psychiatric: She has a normal mood and affect. Her behavior is normal.  Nursing note and vitals reviewed.   ED Course  Procedures (including critical care time)  DIAGNOSTIC STUDIES: Oxygen Saturation is 99% on RA, normal by my interpretation.    COORDINATION OF CARE: 3:09 PM-Discussed treatment plan which includes CT A/P with pt at bedside and pt agreed to plan.   Labs Review Results for  orders placed or performed during the hospital encounter of 07/11/15 (from the past 24 hour(s))  Lipase, blood     Status: None   Collection Time: 07/11/15  1:50 PM  Result Value Ref Range   Lipase 26 22 - 51 U/L  Comprehensive metabolic panel     Status: Abnormal   Collection Time: 07/11/15  1:50 PM  Result Value Ref Range   Sodium 136 135 - 145 mmol/L   Potassium 4.2 3.5 - 5.1 mmol/L   Chloride 103 101 - 111 mmol/L   CO2 23 22 - 32 mmol/L   Glucose, Bld 116 (H) 65 - 99 mg/dL   BUN 10 6 - 20 mg/dL   Creatinine, Ser 4.09 0.44 - 1.00 mg/dL   Calcium 9.4 8.9 - 81.1 mg/dL   Total Protein 7.3 6.5 - 8.1 g/dL   Albumin 4.2 3.5 - 5.0 g/dL  AST 15 15 - 41 U/L   ALT 11 (L) 14 - 54 U/L   Alkaline Phosphatase 63 38 - 126 U/L   Total Bilirubin 0.6 0.3 - 1.2 mg/dL   GFR calc non Af Amer >60 >60 mL/min   GFR calc Af Amer >60 >60 mL/min   Anion gap 10 5 - 15  CBC     Status: Abnormal   Collection Time: 07/11/15  1:50 PM  Result Value Ref Range   WBC 13.4 (H) 4.0 - 10.5 K/uL   RBC 4.35 3.87 - 5.11 MIL/uL   Hemoglobin 14.0 12.0 - 15.0 g/dL   HCT 81.1 91.4 - 78.2 %   MCV 94.9 78.0 - 100.0 fL   MCH 32.2 26.0 - 34.0 pg   MCHC 33.9 30.0 - 36.0 g/dL   RDW 95.6 21.3 - 08.6 %   Platelets 360 150 - 400 K/uL  I-Stat beta hCG blood, ED ([facility], [facility], [facility] only)     Status: None   Collection Time: 07/11/15  1:56 PM  Result Value Ref Range   I-stat hCG, quantitative <5.0 <5 mIU/mL   Comment 3          Urinalysis, Routine w reflex microscopic (not at [facility])     Status: None   Collection Time: 07/11/15  3:11 PM  Result Value Ref Range   Color, Urine YELLOW YELLOW   APPearance CLEAR CLEAR   Specific Gravity, Urine 1.014 1.005 - 1.030   pH 7.0 5.0 - 8.0   Glucose, UA NEGATIVE NEGATIVE mg/dL   Hgb urine dipstick NEGATIVE NEGATIVE   Bilirubin Urine NEGATIVE NEGATIVE   Ketones, ur NEGATIVE NEGATIVE mg/dL   Protein, ur NEGATIVE NEGATIVE mg/dL   Urobilinogen, UA 0.2 0.0 - 1.0 mg/dL   Nitrite NEGATIVE  NEGATIVE   Leukocytes, UA NEGATIVE NEGATIVE     Imaging Review Ct Abdomen Pelvis W Contrast  07/11/2015  CLINICAL DATA:  Right abdominal pain. Symptoms for the past few months. EXAM: CT ABDOMEN AND PELVIS WITH CONTRAST TECHNIQUE: Multidetector CT imaging of the abdomen and pelvis was performed using the standard protocol following bolus administration of intravenous contrast. CONTRAST:  OMNIPAQUE IOHEXOL 300 MG/ML  SOLN COMPARISON:  None. FINDINGS: Minimal dependent atelectasis in the lung bases. Heart is normal size. No effusions. Liver, gallbladder, spleen, pancreas, adrenals and kidneys are normal. IUD in place within the uterus which is otherwise grossly unremarkable. No adnexal masses. Urinary bladder is unremarkable. Appendix is visualized and is normal. Stomach, large and small bowel unremarkable. No free fluid, free air or adenopathy. Aorta is normal caliber. No acute bony abnormality or focal bone lesion. IMPRESSION: No acute findings in the abdomen or pelvis. Normal appendix. Electronically Signed   By: Charlett Nose M.D.   On: 07/11/2015 17:23   I have personally reviewed and evaluated these images and lab results as part of my medical decision-making.   MDM  29 y.o. female with recurrent abdominal pain that is similar to episodes she has had in the past. She has been unable to follow up with GI as planned due to loss of her insurance. She also does not have a PCP now for same reason. She will call [facility] to try and schedule follow up. I will treat for nausea and she will stay on a bland diet for the next few days. She will return here as needed for worsening symptoms. Stable for d/c without surgical abdomen, nausea resolved and does not appear toxic.  Final diagnoses:  Right lower quadrant abdominal pain  Nausea   I personally performed the services described in this documentation, which was scribed in my presence. The recorded information has been reviewed and  is accurate.      [place], NP 07/11/15 1815  Benjiman Core, MD 07/14/15 [phone]

## 2015-07-11 NOTE — ED Notes (Signed)
Pt here for intermittent RLQ pain over the past few months. sts more constant lately. sts some nausea.

## 2015-07-11 NOTE — ED Notes (Signed)
Pt in CT.

## 2015-07-11 NOTE — ED Notes (Signed)
Patient returned from ct

## 2015-07-11 NOTE — ED Notes (Signed)
NP at bedside.

## 2015-07-11 NOTE — ED Notes (Signed)
IV start unsuccessful x [facility]

## 2016-03-04 ENCOUNTER — Inpatient Hospital Stay (HOSPITAL_COMMUNITY): Payer: Self-pay

## 2016-03-04 ENCOUNTER — Inpatient Hospital Stay (HOSPITAL_COMMUNITY)
Admission: AD | Admit: 2016-03-04 | Discharge: 2016-03-04 | Discharge status: Against Medical Advice | Payer: Self-pay | Source: Ambulatory Visit | Attending: Obstetrics and Gynecology | Admitting: Obstetrics and Gynecology

## 2016-03-04 ENCOUNTER — Ambulatory Visit (HOSPITAL_COMMUNITY): Admission: RE | Admit: 2016-03-04 | Payer: Medicaid Other | Source: Ambulatory Visit

## 2016-03-04 ENCOUNTER — Encounter (HOSPITAL_COMMUNITY): Payer: Self-pay | Admitting: *Deleted

## 2016-03-04 DIAGNOSIS — F17210 Nicotine dependence, cigarettes, uncomplicated: Secondary | ICD-10-CM | POA: Insufficient documentation

## 2016-03-04 DIAGNOSIS — R102 Pelvic and perineal pain: Secondary | ICD-10-CM | POA: Insufficient documentation

## 2016-03-04 DIAGNOSIS — R110 Nausea: Secondary | ICD-10-CM | POA: Insufficient documentation

## 2016-03-04 DIAGNOSIS — K5900 Constipation, unspecified: Secondary | ICD-10-CM | POA: Insufficient documentation

## 2016-03-04 DIAGNOSIS — Z975 Presence of (intrauterine) contraceptive device: Secondary | ICD-10-CM | POA: Insufficient documentation

## 2016-03-04 LAB — CBC
HCT: 36.6 % (ref 36.0–46.0)
Hemoglobin: 12.6 g/dL (ref 12.0–15.0)
MCH: 32.2 pg (ref 26.0–34.0)
MCHC: 34.4 g/dL (ref 30.0–36.0)
MCV: 93.6 fL (ref 78.0–100.0)
Platelets: 283 10*3/uL (ref 150–400)
RBC: 3.91 MIL/uL (ref 3.87–5.11)
RDW: 12.7 % (ref 11.5–15.5)
WBC: 11.4 10*3/uL — ABNORMAL HIGH (ref 4.0–10.5)

## 2016-03-04 LAB — COMPREHENSIVE METABOLIC PANEL
ALT: 9 U/L — ABNORMAL LOW (ref 14–54)
AST: 14 U/L — ABNORMAL LOW (ref 15–41)
Albumin: 4.1 g/dL (ref 3.5–5.0)
Alkaline Phosphatase: 59 U/L (ref 38–126)
Anion gap: 4 — ABNORMAL LOW (ref 5–15)
BUN: 11 mg/dL (ref 6–20)
CO2: 26 mmol/L (ref 22–32)
Calcium: 9.5 mg/dL (ref 8.9–10.3)
Chloride: 107 mmol/L (ref 101–111)
Creatinine, Ser: 0.77 mg/dL (ref 0.44–1.00)
GFR calc Af Amer: >60 mL/min (ref >60)
GFR calc non Af Amer: >60 mL/min (ref >60)
Glucose, Bld: 83 mg/dL (ref 65–99)
Potassium: 4.2 mmol/L (ref 3.5–5.1)
Sodium: 137 mmol/L (ref 135–145)
Total Bilirubin: 0.4 mg/dL (ref 0.3–1.2)
Total Protein: 7 g/dL (ref 6.5–8.1)

## 2016-03-04 LAB — POCT PREGNANCY, URINE: Preg Test, Ur: NEGATIVE

## 2016-03-04 LAB — WET PREP, GENITAL
Clue Cells Wet Prep HPF POC: NONE SEEN
Sperm: NONE SEEN
Trich, Wet Prep: NONE SEEN
Yeast Wet Prep HPF POC: NONE SEEN

## 2016-03-04 LAB — URINALYSIS, ROUTINE W REFLEX MICROSCOPIC
Bilirubin Urine: NEGATIVE
Glucose, UA: NEGATIVE mg/dL
Hgb urine dipstick: NEGATIVE
Ketones, ur: NEGATIVE mg/dL
Leukocytes, UA: NEGATIVE
Nitrite: NEGATIVE
Protein, ur: NEGATIVE mg/dL
Specific Gravity, Urine: 1.025 (ref 1.005–1.030)
pH: 6 (ref 5.0–8.0)

## 2016-03-04 LAB — AMYLASE: Amylase: 50 U/L (ref 28–100)

## 2016-03-04 LAB — LIPASE, BLOOD: Lipase: 20 U/L (ref 11–51)

## 2016-03-04 NOTE — MAU Provider Note (Signed)
Chief Complaint: Abdominal Pain   First Provider Initiated Contact with Patient 03/04/16 1425      SUBJECTIVE HPI: Brooke Carrillo is a 30 y.o. [id number] who presents to maternity admissions reporting abdominal/pelvic pain x 3 days.  She reports intermittent constipation and diarrhea, which is normal for her for the last 6 or more months. She has a Mirena IUD, placed 2.5 years ago and reports she cannot feel her strings when she checks for them but could feel them a few months ago. She denies vaginal bleeding, vaginal itching/burning, urinary symptoms, h/a, dizziness, n/v, or fever/chills.     Abdominal Pain This is a new problem. The current episode started in the past 7 days. The onset quality is gradual. The problem occurs intermittently. The most recent episode lasted 3 days. The problem has been unchanged. The pain is located in the LLQ and RLQ. The pain is severe. The quality of the pain is cramping and sharp. The abdominal pain does not radiate. Associated symptoms include constipation, diarrhea and nausea. Pertinent negatives include no fever, headaches or vomiting. Nothing aggravates the pain. The pain is relieved by nothing. Treatments tried: ibuprofen. The treatment provided no relief. ED visits for GI complaints, no formal follow up    Past Medical History  Diagnosis Date  . No pertinent past medical history   . Pregnancy as incidental finding   . Medical history non-contributory    Past Surgical History  Procedure Laterality Date  . Wisdom tooth extraction    . Orif finger / thumb fracture     Social History   Social History  . Marital Status: Married    Spouse Name: N/A  . Number of Children: N/A  . Years of Education: N/A   Occupational History  . Not on file.   Social History Main Topics  . Smoking status: Current Every Day Smoker -- 0.50 packs/day  . Smokeless tobacco: Never Used  . Alcohol Use: No  . Drug Use: No  . Sexual Activity: Yes    Birth Control/  Protection: IUD   Other Topics Concern  . Not on file   Social History Narrative   No current facility-administered medications on file prior to encounter.   No current outpatient prescriptions on file prior to encounter.   No Known Allergies  ROS:  Review of Systems  Constitutional: Negative for fever, chills and fatigue.  Respiratory: Negative for shortness of breath.   Cardiovascular: Negative for chest pain.  Gastrointestinal: Positive for nausea, abdominal pain, diarrhea and constipation. Negative for vomiting.  Genitourinary: Positive for pelvic pain. Negative for flank pain, vaginal bleeding, vaginal discharge, difficulty urinating and vaginal pain.  Neurological: Negative for dizziness and headaches.  Psychiatric/Behavioral: Negative.      I have reviewed patient's Past Medical Hx, Surgical Hx, Family Hx, Social Hx, medications and allergies.   Physical Exam  Patient Vitals for the past 24 hrs:  BP Temp Temp src Pulse Resp Height Weight  03/04/16 1328 112/56 mmHg 98.1 F (36.7 C) Oral 89 18 5\' 3"  (1.6 m) 160 lb (72.576 kg)   Constitutional: Well-developed, well-nourished female in no acute distress.  Cardiovascular: normal rate Respiratory: normal effort GI: Abd soft, non-tender. No rebound tenderness or guarding, Pos BS x 4 MS: Extremities nontender, no edema, normal ROM Neurologic: Alert and oriented x 4.  GU: Neg CVAT.  PELVIC EXAM: Cervix pink, without lesion, Mirena strings visible at cervical os, scant white creamy discharge, vaginal walls and external genitalia normal Bimanual exam: Cervix  0/long/high, firm, anterior, neg CMT, uterus nontender, nonenlarged, adnexa without tenderness, enlargement, or mass   LAB RESULTS Results for orders placed or performed during the hospital encounter of 03/04/16 (from the past 24 hour(s))  Urinalysis, Routine w reflex microscopic (not at [facility])     Status: None   Collection Time: 03/04/16  1:32 PM  Result Value Ref  Range   Color, Urine YELLOW YELLOW   APPearance CLEAR CLEAR   Specific Gravity, Urine 1.025 1.005 - 1.030   pH 6.0 5.0 - 8.0   Glucose, UA NEGATIVE NEGATIVE mg/dL   Hgb urine dipstick NEGATIVE NEGATIVE   Bilirubin Urine NEGATIVE NEGATIVE   Ketones, ur NEGATIVE NEGATIVE mg/dL   Protein, ur NEGATIVE NEGATIVE mg/dL   Nitrite NEGATIVE NEGATIVE   Leukocytes, UA NEGATIVE NEGATIVE  Pregnancy, urine POC     Status: None   Collection Time: 03/04/16  1:39 PM  Result Value Ref Range   Preg Test, Ur NEGATIVE NEGATIVE  CBC     Status: Abnormal   Collection Time: 03/04/16  2:39 PM  Result Value Ref Range   WBC 11.4 (H) 4.0 - 10.5 K/uL   RBC 3.91 3.87 - 5.11 MIL/uL   Hemoglobin 12.6 12.0 - 15.0 g/dL   HCT 16.1 09.6 - 04.5 %   MCV 93.6 78.0 - 100.0 fL   MCH 32.2 26.0 - 34.0 pg   MCHC 34.4 30.0 - 36.0 g/dL   RDW 40.9 81.1 - 91.4 %   Platelets 283 150 - 400 K/uL       IMAGING No results found.  MAU Management/MDM: Ordered labs and reviewed results.  Ordered pelvic US to evaluate IUD.  Pt not in room when RN arrived to take her to [place].  Left AMA.      Medication List    Notice    You have not been prescribed any medications.       Sharen Counter Certified Nurse-Midwife 03/04/2016  3:02 PM

## 2016-03-04 NOTE — MAU Note (Signed)
Pt has had Mirena IUD for 1 1/2 years - inserted at [facility].  Started having extreme abdominal pain 3 days ago.  Has been spotting recently, not today.

## 2016-03-04 NOTE — Progress Notes (Signed)
Went to take patient to ultrasound but she was not in her room.  Checked bathrooms gown was laying on the bed.  Called ultrasound to confirm she was not there and rechecked after 15 min.  Left ama.

## 2016-03-05 LAB — GC/CHLAMYDIA PROBE AMP (~~LOC~~) NOT AT ARMC
Chlamydia: NEGATIVE
Neisseria Gonorrhea: NEGATIVE

## 2016-08-27 ENCOUNTER — Ambulatory Visit (INDEPENDENT_AMBULATORY_CARE_PROVIDER_SITE_OTHER): Payer: Medicaid Other | Admitting: Certified Nurse Midwife

## 2016-08-27 ENCOUNTER — Encounter: Payer: Self-pay | Admitting: Certified Nurse Midwife

## 2016-08-27 ENCOUNTER — Other Ambulatory Visit (HOSPITAL_COMMUNITY)
Admission: RE | Admit: 2016-08-27 | Discharge: 2016-08-27 | Disposition: A | Discharge status: Home / Self Care | Payer: Medicaid Other | Source: Ambulatory Visit | Attending: Obstetrics | Admitting: Obstetrics

## 2016-08-27 VITALS — BP 112/72 | HR 69 | Wt 164.0 lb

## 2016-08-27 DIAGNOSIS — Z30432 Encounter for removal of intrauterine contraceptive device: Secondary | ICD-10-CM

## 2016-08-27 DIAGNOSIS — Z30016 Encounter for initial prescription of transdermal patch hormonal contraceptive device: Secondary | ICD-10-CM

## 2016-08-27 DIAGNOSIS — Z01419 Encounter for gynecological examination (general) (routine) without abnormal findings: Secondary | ICD-10-CM | POA: Insufficient documentation

## 2016-08-27 MED ORDER — NORELGESTROMIN-ETH ESTRADIOL 150-35 MCG/24HR TD PTWK
1.0000 | MEDICATED_PATCH | TRANSDERMAL | 12 refills | Status: DC
Start: 2016-08-27 — End: 2017-06-03

## 2016-08-27 NOTE — Progress Notes (Signed)
Subjective:        Brooke Carrillo is a 30 y.o. female here for a routine exam.  Current complaints: Desires to have Mirena removed, has been bleeding 3 weeks/month and is irritable with it.  Desires to use the patch for contraception, has used it in the past.  Declines nuva ring or other birth control options.   Still has low sexual desire.  Is married.  Does not desire blood work for STDs.    Personal health questionnaire:  Is patient Ashkenazi Jewish, have a family history of breast and/or ovarian cancer: no Is there a family history of uterine cancer diagnosed at age < 5450, gastrointestinal cancer, urinary tract cancer, family member who is a Personnel officerLynch syndrome-associated carrier: no Is the patient overweight and hypertensive, family history of diabetes, personal history of gestational diabetes, preeclampsia or PCOS: no Is patient over 3355, have PCOS,  family history of premature CHD under age 30, diabetes, smoke, have hypertension or peripheral artery disease:  yes At any time, has a partner hit, kicked or otherwise hurt or frightened you?: no Over the past 2 weeks, have you felt down, depressed or hopeless?: no Over the past 2 weeks, have you felt little interest or pleasure in doing things?:no   Gynecologic History Patient's last menstrual period was 07/13/2016. Contraception: IUD Last Pap: 02/04/15. Results were: normal Last mammogram: n/a.   Obstetric History OB History  Gravida Para Term Preterm AB Living  6 3 3  0 3 3  SAB TAB Ectopic Multiple Live Births  2 1 0 0 3    # Outcome Date GA Lbr Len/2nd Weight Sex Delivery Anes PTL Lv  6 Term 01/03/15 2881w2d 01:53 / 00:11 7 lb 11.1 oz (3.49 kg) F Vag-Spont EPI  LIV     Birth Comments: None  5 Term 02/24/12 5591w2d 410:00 / 00:16 7 lb 13.9 oz (3.57 kg) F Vag-Spont EPI  LIV  4 SAB 2011          3 SAB 2009          2 Term 11/10/06 2652w0d  7 lb 6 oz (3.345 kg) F Vag-Spont EPI N LIV  1 TAB               Past Medical History:  Diagnosis  Date  . Medical history non-contributory   . No pertinent past medical history   . Pregnancy as incidental finding     Past Surgical History:  Procedure Laterality Date  . ORIF FINGER / THUMB FRACTURE    . WISDOM TOOTH EXTRACTION       Current Outpatient Prescriptions:  .  norelgestromin-ethinyl estradiol (ORTHO EVRA) 150-35 MCG/24HR transdermal patch, Place 1 patch onto the skin once a week., Disp: 3 patch, Rfl: 12 No Known Allergies  Social History  Substance Use Topics  . Smoking status: Current Every Day Smoker    Packs/day: 0.50  . Smokeless tobacco: Never Used  . Alcohol use No    Family History  Problem Relation Age of Onset  . Hypertension Father   . Dementia Mother       Review of Systems  Constitutional: negative for fatigue and weight loss Respiratory: negative for cough and wheezing Cardiovascular: negative for chest pain, fatigue and palpitations Gastrointestinal: negative for abdominal pain and change in bowel habits Musculoskeletal:negative for myalgias Neurological: negative for gait problems and tremors Behavioral/Psych: negative for abusive relationship, depression Endocrine: negative for temperature intolerance    Genitourinary:negative for abnormal menstrual periods, genital lesions,  hot flashes, sexual problems and vaginal discharge Integument/breast: negative for breast lump, breast tenderness, nipple discharge and skin lesion(s)    Objective:       BP 112/72   Pulse 69   Wt 164 lb (74.4 kg)   LMP 07/13/2016   BMI 29.05 kg/m  General:   alert  Skin:   no rash or abnormalities  Lungs:   clear to auscultation bilaterally  Heart:   regular rate and rhythm, S1, S2 normal, no murmur, click, rub or gallop  Breasts:   normal without suspicious masses, skin or nipple changes or axillary nodes  Abdomen:  normal findings: no organomegaly, soft, non-tender and no hernia  Pelvis:  External genitalia: normal general appearance Urinary system:  urethral meatus normal and bladder without fullness, nontender Vaginal: normal without tenderness, induration or masses Cervix: normal appearance, IUD strings present at Os Adnexa: normal bimanual exam Uterus: anteverted and non-tender, normal size  MIRENA REMOVAL   Reasons  for removal:  Is having severe lower abdominal pain, cramping for 2 weeks.  Is having regular periods with prolonged bleeding.     A timeout was performed confirming the patient, the procedure and allergy status. The patient was placed in the lithotomy position, a speculum was placed.  Cervix and strings visualized.   Long forceps used in a strile manner.  Stings grasped with long forceps and device removed intact.  The patient tolerated the procedure well.  New contraceptive method: transdermal patch.  Prenatal vitamins given to the patient.     Lab Review Urine pregnancy test Labs reviewed yes Radiologic studies reviewed no  50% of 45 min visit spent on counseling and coordination of care.    Assessment:    Healthy female exam.   IUD removed  Contraception counseling  Tobacco abuse counseling  Plan:    Education reviewed: calcium supplements, depression evaluation, low fat, low cholesterol diet, safe sex/STD prevention, self breast exams, skin cancer screening, smoking cessation and weight bearing exercise. Contraception: Ortho-Evra patches weekly. Follow up in: 1 year.   Meds ordered this encounter  Medications  . norelgestromin-ethinyl estradiol (ORTHO EVRA) 150-35 MCG/24HR transdermal patch    Sig: Place 1 patch onto the skin once a week.    Dispense:  3 patch    Refill:  12   No orders of the defined types were placed in this encounter.

## 2016-08-30 LAB — NUSWAB VG+, CANDIDA 6SP
BVAB 2: HIGH Score — AB
Candida albicans, NAA: NEGATIVE
Candida glabrata, NAA: NEGATIVE
Candida krusei, NAA: NEGATIVE
Candida lusitaniae, NAA: NEGATIVE
Candida parapsilosis, NAA: NEGATIVE
Candida tropicalis, NAA: NEGATIVE
Chlamydia trachomatis, NAA: NEGATIVE
Neisseria gonorrhoeae, NAA: NEGATIVE
Trich vag by NAA: NEGATIVE

## 2016-09-01 LAB — CYTOLOGY - PAP: Diagnosis: NEGATIVE

## 2016-09-02 ENCOUNTER — Other Ambulatory Visit: Payer: Self-pay | Admitting: Certified Nurse Midwife

## 2016-09-02 DIAGNOSIS — N760 Acute vaginitis: Principal | ICD-10-CM

## 2016-09-02 DIAGNOSIS — B9689 Other specified bacterial agents as the cause of diseases classified elsewhere: Secondary | ICD-10-CM

## 2016-09-02 MED ORDER — METRONIDAZOLE 500 MG PO TABS: 500.0000 mg | ORAL_TABLET | Freq: Two times a day (BID) | ORAL | 0 refills | Status: DC

## 2016-09-04 ENCOUNTER — Telehealth: Payer: Self-pay

## 2016-09-04 NOTE — Telephone Encounter (Signed)
Contacted pt to inform of lab results and rx sent by provider, no answer, left vm to call.

## 2016-09-05 ENCOUNTER — Telehealth: Payer: Self-pay

## 2016-09-05 NOTE — Telephone Encounter (Signed)
Patient called in, advised of lab results and rx sent by provider.

## 2016-10-13 ENCOUNTER — Encounter (HOSPITAL_COMMUNITY): Payer: Self-pay | Admitting: Emergency Medicine

## 2016-10-13 ENCOUNTER — Emergency Department (HOSPITAL_COMMUNITY)
Admission: EM | Admit: 2016-10-13 | Discharge: 2016-10-13 | Disposition: A | Discharge status: Home / Self Care | Payer: Medicaid Other | Attending: Emergency Medicine | Admitting: Emergency Medicine

## 2016-10-13 DIAGNOSIS — Z79899 Other long term (current) drug therapy: Secondary | ICD-10-CM | POA: Diagnosis not present

## 2016-10-13 DIAGNOSIS — J020 Streptococcal pharyngitis: Secondary | ICD-10-CM | POA: Insufficient documentation

## 2016-10-13 DIAGNOSIS — F17200 Nicotine dependence, unspecified, uncomplicated: Secondary | ICD-10-CM | POA: Insufficient documentation

## 2016-10-13 DIAGNOSIS — J029 Acute pharyngitis, unspecified: Secondary | ICD-10-CM | POA: Diagnosis present

## 2016-10-13 LAB — RAPID STREP SCREEN (MED CTR MEBANE ONLY): Streptococcus, Group A Screen (Direct): POSITIVE — AB

## 2016-10-13 MED ORDER — IBUPROFEN 800 MG PO TABS
800.0000 mg | ORAL_TABLET | Freq: Three times a day (TID) | ORAL | 0 refills | Status: DC
Start: 2016-10-13 — End: 2017-06-03

## 2016-10-13 MED ORDER — BENZONATATE 100 MG PO CAPS: 100.0000 mg | ORAL_CAPSULE | Freq: Three times a day (TID) | ORAL | 0 refills | Status: DC

## 2016-10-13 MED ORDER — PENICILLIN G BENZATHINE 1200000 UNIT/2ML IM SUSP
1.2000 10*6.[IU] | Freq: Once | INTRAMUSCULAR | Status: AC
  Administered 2016-10-13: 1.2 10*6.[IU] via INTRAMUSCULAR
  Filled 2016-10-13: qty 2

## 2016-10-13 NOTE — ED Notes (Signed)
Papers reviewed with patient and she verbalizes understanding.  

## 2016-10-13 NOTE — ED Triage Notes (Signed)
Sore throat and body aches since sat has taken  OTC meds  Not helping
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
## 2016-10-13 NOTE — Discharge Instructions (Signed)
Your strep test was positive, indicating you have strep throat. You have been treated for this with an antibiotic here in the ED. Pain and symptoms may continue for the next few days.  Additional treatment is symptomatic care and it is important to note that these symptoms may last for 7-10 days. Drink plenty of fluids and get plenty of rest. You should be drinking at least half a liter of water an hour to stay hydrated. Electrolyte drinks are also encouraged. Ibuprofen, Naproxen, or Tylenol for pain or fever. Tessalon for cough. Plain Mucinex may help relieve congestion. Warm liquids or Chloraseptic spray may help soothe a sore throat. Follow up with a primary care provider, as needed, for any future management of this issue.

## 2016-12-07 ENCOUNTER — Encounter (HOSPITAL_COMMUNITY): Payer: Self-pay | Admitting: *Deleted

## 2016-12-07 ENCOUNTER — Emergency Department (HOSPITAL_COMMUNITY)
Admission: EM | Admit: 2016-12-07 | Discharge: 2016-12-07 | Disposition: A | Discharge status: Home / Self Care | Payer: Medicaid Other | Attending: Emergency Medicine | Admitting: Emergency Medicine

## 2016-12-07 DIAGNOSIS — F17200 Nicotine dependence, unspecified, uncomplicated: Secondary | ICD-10-CM | POA: Insufficient documentation

## 2016-12-07 DIAGNOSIS — R6884 Jaw pain: Secondary | ICD-10-CM | POA: Diagnosis present

## 2016-12-07 DIAGNOSIS — M26629 Arthralgia of temporomandibular joint, unspecified side: Secondary | ICD-10-CM

## 2016-12-07 DIAGNOSIS — M26609 Unspecified temporomandibular joint disorder, unspecified side: Secondary | ICD-10-CM | POA: Diagnosis not present

## 2016-12-07 MED ORDER — TRAMADOL HCL 50 MG PO TABS
100.0000 mg | ORAL_TABLET | Freq: Once | ORAL | Status: AC
  Administered 2016-12-07: 100 mg via ORAL
  Filled 2016-12-07: qty 2

## 2016-12-07 MED ORDER — NAPROXEN 500 MG PO TABS
500.0000 mg | ORAL_TABLET | Freq: Two times a day (BID) | ORAL | 0 refills | Status: DC
Start: 2016-12-07 — End: 2017-06-03

## 2016-12-07 MED ORDER — NAPROXEN 250 MG PO TABS
500.0000 mg | ORAL_TABLET | Freq: Once | ORAL | Status: AC
  Administered 2016-12-07: 500 mg via ORAL
  Filled 2016-12-07: qty 2

## 2016-12-07 MED ORDER — TRAMADOL HCL 50 MG PO TABS: 100.0000 mg | ORAL_TABLET | Freq: Four times a day (QID) | ORAL | 0 refills | Status: DC | PRN

## 2016-12-07 NOTE — ED Provider Notes (Signed)
[facility]-EMERGENCY DEPT Provider Note   CSN: [id number] Arrival date & time: 12/07/16  1401  By signing my name below, I, Linna Darner, attest that this documentation has been prepared under the direction and in the presence of physician practitioner, Arby Barrette, MD. Electronically Signed: Linna Darner, Scribe. 12/07/2016. 3:50 PM.  History   Chief Complaint Chief Complaint  Patient presents with  . Jaw Pain    The history is provided by the patient. No language interpreter was used.     HPI Comments: Brooke Carrillo is a 31 y.o. female who presents to the Emergency Department complaining of sudden onset, constant, right jaw pain beginning yesterday. She notes associated right-sided facial swelling. She states the pain is most significant just underneath her right ear. Pt reports her pain is worse with chewing, applied pressure to her right jaw, and opening/closing her mouth, and notes she cannot open her mouth completely secondary to pain. She is unsure if she grinds her teeth or clenches her jaw frequently. She reports a recent cough as well. Pt states her teeth "ain't that great" and denies h/o frequent ear infections. Pt denies dental pain, ear pain, fever, nausea, vomiting, SOB, or any other associated symptoms.  Past Medical History:  Diagnosis Date  . Medical history non-contributory   . No pertinent past medical history   . Pregnancy as incidental finding     Patient Active Problem List   Diagnosis Date Noted  . Encounter for initial prescription of transdermal patch hormonal contraceptive device 08/27/2016    Past Surgical History:  Procedure Laterality Date  . ORIF FINGER / THUMB FRACTURE    . WISDOM TOOTH EXTRACTION      OB History    Gravida Para Term Preterm AB Living   6 3 3  0 3 3   SAB TAB Ectopic Multiple Live Births   2 1 0 0 3       Home Medications    Prior to Admission medications   Medication Sig Start Date End Date Taking? Authorizing Provider    benzonatate (TESSALON) 100 MG capsule Take 1 capsule (100 mg total) by mouth every 8 (eight) hours. 10/13/16   Shawn C Joy, PA-C  ibuprofen (ADVIL,MOTRIN) 800 MG tablet Take 1 tablet (800 mg total) by mouth 3 (three) times daily. 10/13/16   Shawn C Joy, PA-C  metroNIDAZOLE (FLAGYL) 500 MG tablet Take 1 tablet (500 mg total) by mouth 2 (two) times daily. 09/02/16   Rachelle A Denney, CNM  naproxen (NAPROSYN) 500 MG tablet Take 1 tablet (500 mg total) by mouth 2 (two) times daily. 12/07/16   Arby Barrette, MD  norelgestromin-ethinyl estradiol (ORTHO EVRA) 150-35 MCG/24HR transdermal patch Place 1 patch onto the skin once a week. 08/27/16   Rachelle A Denney, CNM  traMADol (ULTRAM) 50 MG tablet Take 2 tablets (100 mg total) by mouth every 6 (six) hours as needed. 12/07/16   Arby Barrette, MD    Family History Family History  Problem Relation Age of Onset  . Hypertension Father   . Dementia Mother     Social History Social History  Substance Use Topics  . Smoking status: Current Every Day Smoker    Packs/day: 0.50  . Smokeless tobacco: Never Used  . Alcohol use No     Allergies   Patient has no known allergies.   Review of Systems Review of Systems  10 Systems reviewed and all are negative for acute change except as noted in the HPI.  Physical Exam  Updated Vital Signs BP 111/56 (BP Location: Right Arm)   Pulse (!) 54   Temp 98.2 F (36.8 C) (Oral)   Resp 16   Ht 5\' 3"  (1.6 m)   Wt 158 lb (71.7 kg)   LMP 12/06/2016   SpO2 97%   BMI 27.99 kg/m   Physical Exam  Constitutional: She is oriented to person, place, and time. She appears well-developed and well-nourished. No distress.  HENT:  Head: Normocephalic and atraumatic.  Right Ear: Tympanic membrane and ear canal normal.  Left Ear: Tympanic membrane normal.  TTP of right pre-auriclar. No palpable anomaly. No tragus tenderness or pain with movement of pinna. auditory canal is normal, TM is normal Right mandible is  tender to palpation in are of the TMJ with no visible specific swelling. Body of mandible from the angle to the mentum is nontender. No submandibular tenderness or firmness. Dental condition is good. Patient has had extraction of posterior most molars. She does not have tenderness to percussion over the existing teeth. No mucosal or soft tissue abnormalities within the mouth.  Eyes: Conjunctivae and EOM are normal.  Neck: Neck supple.  Cardiovascular: Exam reveals no gallop.   Pulmonary/Chest: Effort normal.  Musculoskeletal: Normal range of motion.  Lymphadenopathy:    She has no cervical adenopathy.  Neurological: She is alert and oriented to person, place, and time. No cranial nerve deficit. She exhibits normal muscle tone. Coordination normal.  Skin: Skin is warm and dry.  Psychiatric: She has a normal mood and affect.  Nursing note and vitals reviewed.   ED Treatments / Results  Labs (all labs ordered are listed, but only abnormal results are displayed) Labs Reviewed - No data to display  EKG  EKG Interpretation None       Radiology No results found.  Procedures Procedures (including critical care time)  DIAGNOSTIC STUDIES: Oxygen Saturation is 100% on RA, normal by my interpretation.    COORDINATION OF CARE: 3:56 PM Discussed treatment plan with pt at bedside and pt agreed to plan.  Medications Ordered in ED Medications  naproxen (NAPROSYN) tablet 500 mg (500 mg Oral Given 12/07/16 1613)  traMADol (ULTRAM) tablet 100 mg (100 mg Oral Given 12/07/16 1613)     Initial Impression / Assessment and Plan / ED Course  I have reviewed the triage vital signs and the nursing notes.  Pertinent labs & imaging results that were available during my care of the patient were reviewed by me and considered in my medical decision making (see chart for details).      Final Clinical Impressions(s) / ED Diagnoses   Final diagnoses:  TMJ syndrome   Patient's pain is localized  over the area of the temporomandibular joint. Pain was biting down and with range of motion. Condition of the soft tissues of the mouth and the dentition is good. He she'll be treated for TMJ with instructions to follow-up with her dentist.  New Prescriptions Discharge Medication List as of 12/07/2016  4:02 PM    START taking these medications   Details  naproxen (NAPROSYN) 500 MG tablet Take 1 tablet (500 mg total) by mouth 2 (two) times daily., Starting Sun 12/07/2016, Print    traMADol (ULTRAM) 50 MG tablet Take 2 tablets (100 mg total) by mouth every 6 (six) hours as needed., Starting Sun 12/07/2016, Print          Arby BarretteMarcy Floretta Petro, MD 12/09/16 [phone]

## 2016-12-07 NOTE — ED Triage Notes (Signed)
Pt with r sided jaw pain since yesterday that is not relieved with ibuprofen.  No swelling/deformity noted.  Denies ear or dental pain and denies injury.

## 2016-12-07 NOTE — ED Notes (Signed)
See providers assessment.  

## 2017-05-28 ENCOUNTER — Ambulatory Visit (INDEPENDENT_AMBULATORY_CARE_PROVIDER_SITE_OTHER): Payer: Medicaid Other

## 2017-05-28 DIAGNOSIS — N912 Amenorrhea, unspecified: Secondary | ICD-10-CM

## 2017-05-28 DIAGNOSIS — Z3201 Encounter for pregnancy test, result positive: Secondary | ICD-10-CM

## 2017-05-28 NOTE — Progress Notes (Addendum)
Ms. Brooke Carrillo presents today for UPT. She has no unusual complaints. LMP: Pt could not give an estimate LMP. Quant Drawn today.    OBJECTIVE: Appears well, in no apparent distress.  OB History    Gravida Para Term Preterm AB Living   6 3 3  0 3 3   SAB TAB Ectopic Multiple Live Births   2 1 0 0 3     Home UPT Result: Positive  In-Office UPT result: Positive  I have reviewed the patient's medical, obstetrical, social, and family histories, and medications.   ASSESSMENT: Positive pregnancy test  PLAN Prenatal care to be completed at: [facility] Pt to start PNV's. Samples given.      Agree with nursing staff's documentation of this patient's clinic encounter.  Roe Coombsachelle A Denney, CNM    I

## 2017-05-29 LAB — POCT URINE PREGNANCY: Preg Test, Ur: POSITIVE — AB

## 2017-05-29 LAB — BETA HCG QUANT (REF LAB): hCG Quant: 758 m[IU]/mL

## 2017-06-03 ENCOUNTER — Inpatient Hospital Stay (HOSPITAL_COMMUNITY)
Admission: AD | Admit: 2017-06-03 | Discharge: 2017-06-04 | Disposition: A | Discharge status: Home / Self Care | Payer: Medicaid Other | Source: Ambulatory Visit | Attending: Obstetrics and Gynecology | Admitting: Obstetrics and Gynecology

## 2017-06-03 ENCOUNTER — Inpatient Hospital Stay (HOSPITAL_COMMUNITY): Payer: Medicaid Other

## 2017-06-03 ENCOUNTER — Encounter (HOSPITAL_COMMUNITY): Payer: Self-pay

## 2017-06-03 DIAGNOSIS — Z3A01 Less than 8 weeks gestation of pregnancy: Secondary | ICD-10-CM | POA: Insufficient documentation

## 2017-06-03 DIAGNOSIS — F17210 Nicotine dependence, cigarettes, uncomplicated: Secondary | ICD-10-CM | POA: Insufficient documentation

## 2017-06-03 DIAGNOSIS — O208 Other hemorrhage in early pregnancy: Secondary | ICD-10-CM

## 2017-06-03 DIAGNOSIS — N939 Abnormal uterine and vaginal bleeding, unspecified: Secondary | ICD-10-CM | POA: Diagnosis present

## 2017-06-03 DIAGNOSIS — O99331 Smoking (tobacco) complicating pregnancy, first trimester: Secondary | ICD-10-CM | POA: Insufficient documentation

## 2017-06-03 DIAGNOSIS — O209 Hemorrhage in early pregnancy, unspecified: Secondary | ICD-10-CM | POA: Insufficient documentation

## 2017-06-03 LAB — URINALYSIS, ROUTINE W REFLEX MICROSCOPIC
Bilirubin Urine: NEGATIVE
Glucose, UA: NEGATIVE mg/dL
Ketones, ur: NEGATIVE mg/dL
Leukocytes, UA: NEGATIVE
Nitrite: NEGATIVE
Protein, ur: NEGATIVE mg/dL
Specific Gravity, Urine: 1.02 (ref 1.005–1.030)
pH: 6 (ref 5.0–8.0)

## 2017-06-03 LAB — CBC
HCT: 37.9 % (ref 36.0–46.0)
Hemoglobin: 13 g/dL (ref 12.0–15.0)
MCH: 33.4 pg (ref 26.0–34.0)
MCHC: 34.3 g/dL (ref 30.0–36.0)
MCV: 97.4 fL (ref 78.0–100.0)
Platelets: 343 10*3/uL (ref 150–400)
RBC: 3.89 MIL/uL (ref 3.87–5.11)
RDW: 12.8 % (ref 11.5–15.5)
WBC: 14.3 10*3/uL — ABNORMAL HIGH (ref 4.0–10.5)

## 2017-06-03 LAB — WET PREP, GENITAL
Clue Cells Wet Prep HPF POC: NONE SEEN
Sperm: NONE SEEN
Trich, Wet Prep: NONE SEEN
Yeast Wet Prep HPF POC: NONE SEEN

## 2017-06-03 LAB — ABO/RH: ABO/RH(D): O NEG

## 2017-06-03 LAB — POCT PREGNANCY, URINE: Preg Test, Ur: POSITIVE — AB

## 2017-06-03 LAB — HCG, QUANTITATIVE, PREGNANCY: hCG, Beta Chain, Quant, S: 1064 m[IU]/mL — ABNORMAL HIGH (ref <5)

## 2017-06-03 MED ORDER — RHO D IMMUNE GLOBULIN 1500 UNIT/2ML IJ SOSY
300.0000 ug | PREFILLED_SYRINGE | Freq: Once | INTRAMUSCULAR | Status: AC
  Administered 2017-06-04: 300 ug via INTRAMUSCULAR
  Filled 2017-06-03: qty 2

## 2017-06-03 NOTE — MAU Provider Note (Signed)
Chief Complaint: Vaginal Bleeding   First Provider Initiated Contact with Patient 06/03/17 2235        SUBJECTIVE HPI: Brooke Carrillo is a 31 y.o. [id number] at early gestation by unknown LMP who presents to maternity admissions reporting vaginal bleeding which was brown and then red.. Had some clots.  Only a little cramping. She denies vaginal bleeding, vaginal itching/burning, urinary symptoms, h/a, dizziness, n/v, or fever/chills.     Vaginal Bleeding  The patient's primary symptoms include pelvic pain and vaginal bleeding. The patient's pertinent negatives include no genital itching, genital lesions or genital odor. This is a new problem. The current episode started today. The problem occurs intermittently. The problem has been unchanged. The pain is mild. The problem affects both sides. She is pregnant. Associated symptoms include abdominal pain. Pertinent negatives include no constipation, diarrhea, dysuria, fever, nausea or vomiting. The vaginal discharge was bloody. The vaginal bleeding is lighter than menses. Nothing aggravates the symptoms. She has tried nothing for the symptoms.   RN note: Pt here with c/o vaginal bleeding that started today; was brownish then got to be bright red with some clots then back to brown at this time  Past Medical History:  Diagnosis Date  . Medical history non-contributory   . No pertinent past medical history   . Pregnancy as incidental finding    Past Surgical History:  Procedure Laterality Date  . ORIF FINGER / THUMB FRACTURE    . WISDOM TOOTH EXTRACTION     Social History   Social History  . Marital status: Married    Spouse name: N/A  . Number of children: N/A  . Years of education: N/A   Occupational History  . Not on file.   Social History Main Topics  . Smoking status: Current Every Day Smoker    Packs/day: 0.50  . Smokeless tobacco: Never Used  . Alcohol use No  . Drug use: No  . Sexual activity: Yes    Birth control/  protection: None   Other Topics Concern  . Not on file   Social History Narrative  . No narrative on file   No current facility-administered medications on file prior to encounter.    No current outpatient prescriptions on file prior to encounter.   No Known Allergies  I have reviewed patient's Past Medical Hx, Surgical Hx, Family Hx, Social Hx, medications and allergies.   ROS:  Review of Systems  Constitutional: Negative for fever.  Gastrointestinal: Positive for abdominal pain. Negative for constipation, diarrhea, nausea and vomiting.  Genitourinary: Positive for pelvic pain and vaginal bleeding. Negative for dysuria.   Review of Systems  Other systems negative   Physical Exam  Physical Exam Patient Vitals for the past 24 hrs:  BP Temp Temp src Pulse Resp SpO2 Height Weight  06/03/17 2225 (!) 127/56 98.1 F (36.7 C) Oral 86 18 100 % 5\' 3"  (1.6 m) 152 lb (68.9 kg)   Constitutional: Well-developed, well-nourished female in no acute distress.  Cardiovascular: normal rate Respiratory: normal effort GI: Abd soft, non-tender. Pos BS x 4 MS: Extremities nontender, no edema, normal ROM Neurologic: Alert and oriented x 4.  GU: Neg CVAT.  PELVIC EXAM: Cervix pink, visually closed, without lesion, scant brown/red discharge, vaginal walls and external genitalia normal Uterus small, slightly tender.  Adnexae nontender, no masses    LAB RESULTS Results for orders placed or performed during the hospital encounter of 06/03/17 (from the past 24 hour(s))  Urinalysis, Routine w reflex microscopic  Status: Abnormal   Collection Time: 06/03/17 10:30 PM  Result Value Ref Range   Color, Urine YELLOW YELLOW   APPearance HAZY (A) CLEAR   Specific Gravity, Urine 1.020 1.005 - 1.030   pH 6.0 5.0 - 8.0   Glucose, UA NEGATIVE NEGATIVE mg/dL   Hgb urine dipstick SMALL (A) NEGATIVE   Bilirubin Urine NEGATIVE NEGATIVE   Ketones, ur NEGATIVE NEGATIVE mg/dL   Protein, ur NEGATIVE  NEGATIVE mg/dL   Nitrite NEGATIVE NEGATIVE   Leukocytes, UA NEGATIVE NEGATIVE   RBC / HPF 0-5 0 - 5 RBC/hpf   WBC, UA 6-30 0 - 5 WBC/hpf   Bacteria, UA RARE (A) NONE SEEN   Squamous Epithelial / LPF 0-5 (A) NONE SEEN   Mucus PRESENT   Pregnancy, urine POC     Status: Abnormal   Collection Time: 06/03/17 10:35 PM  Result Value Ref Range   Preg Test, Ur POSITIVE (A) NEGATIVE  hCG, quantitative, pregnancy     Status: Abnormal   Collection Time: 06/03/17 10:39 PM  Result Value Ref Range   hCG, Beta Chain, Quant, S 1,064 (H) <5 mIU/mL  CBC     Status: Abnormal   Collection Time: 06/03/17 10:39 PM  Result Value Ref Range   WBC 14.3 (H) 4.0 - 10.5 K/uL   RBC 3.89 3.87 - 5.11 MIL/uL   Hemoglobin 13.0 12.0 - 15.0 g/dL   HCT 16.1 09.6 - 04.5 %   MCV 97.4 78.0 - 100.0 fL   MCH 33.4 26.0 - 34.0 pg   MCHC 34.3 30.0 - 36.0 g/dL   RDW 40.9 81.1 - 91.4 %   Platelets 343 150 - 400 K/uL  ABO/Rh     Status: None   Collection Time: 06/03/17 10:40 PM  Result Value Ref Range   ABO/RH(D) O NEG   Rh IG workup (includes ABO/Rh)     Status: None (Preliminary result)   Collection Time: 06/03/17 10:40 PM  Result Value Ref Range   Gestational Age(Wks) 5    ABO/RH(D) O NEG    Antibody Screen NEG    Unit Number [id number]    Blood Component Type RHIG    Unit division 00    Status of Unit ISSUED    Transfusion Status OK TO TRANSFUSE   Wet prep, genital     Status: Abnormal   Collection Time: 06/03/17 10:50 PM  Result Value Ref Range   Yeast Wet Prep HPF POC NONE SEEN NONE SEEN   Trich, Wet Prep NONE SEEN NONE SEEN   Clue Cells Wet Prep HPF POC NONE SEEN NONE SEEN   WBC, Wet Prep HPF POC MODERATE (A) NONE SEEN   Sperm NONE SEEN        IMAGING US Ob Comp Less 14 Wks  Result Date: 06/03/2017 CLINICAL DATA:  Initial evaluation for acute vaginal bleeding, early pregnancy. EXAM: OBSTETRIC <14 WK [place] AND TRANSVAGINAL OB US TECHNIQUE: Both transabdominal and transvaginal ultrasound examinations  were performed for complete evaluation of the gestation as well as the maternal uterus, adnexal regions, and pelvic cul-de-sac. Transvaginal technique was performed to assess early pregnancy. COMPARISON:  None. FINDINGS: Intrauterine gestational sac: Single Yolk sac:  Not visualized. Embryo:  Not visualized. Cardiac Activity: Not visualized. Heart Rate: N/A  bpm MSD: 5  mm   5 w   1  d Subchorionic hemorrhage:  None visualized. Maternal uterus/adnexae: Ovaries well visualized within the adnexa and are normal in appearance. No adnexal mass. No free fluid. IMPRESSION: 1. Probable  early intrauterine gestational sac, but no yolk sac, fetal pole, or cardiac activity yet visualized. Recommend follow-up quantitative B-HCG levels and follow-up US in 14 days to assess viability. This recommendation follows SRU consensus guidelines: Diagnostic Criteria for Nonviable Pregnancy Early in the First Trimester. Malva Limes Med 2013; 604:5409-81. 2. No other acute maternal uterine or adnexal abnormality identified. Electronically Signed   By: Rise Mu M.D.   On: 06/03/2017 23:26   US Ob Transvaginal  Result Date: 06/03/2017 CLINICAL DATA:  Initial evaluation for acute vaginal bleeding, early pregnancy. EXAM: OBSTETRIC <14 WK [place] AND TRANSVAGINAL OB US TECHNIQUE: Both transabdominal and transvaginal ultrasound examinations were performed for complete evaluation of the gestation as well as the maternal uterus, adnexal regions, and pelvic cul-de-sac. Transvaginal technique was performed to assess early pregnancy. COMPARISON:  None. FINDINGS: Intrauterine gestational sac: Single Yolk sac:  Not visualized. Embryo:  Not visualized. Cardiac Activity: Not visualized. Heart Rate: N/A  bpm MSD: 5  mm   5 w   1  d Subchorionic hemorrhage:  None visualized. Maternal uterus/adnexae: Ovaries well visualized within the adnexa and are normal in appearance. No adnexal mass. No free fluid. IMPRESSION: 1. Probable early intrauterine  gestational sac, but no yolk sac, fetal pole, or cardiac activity yet visualized. Recommend follow-up quantitative B-HCG levels and follow-up US in 14 days to assess viability. This recommendation follows SRU consensus guidelines: Diagnostic Criteria for Nonviable Pregnancy Early in the First Trimester. Malva Limes Med 2013; 191:4782-95. 2. No other acute maternal uterine or adnexal abnormality identified. Electronically Signed   By: Rise Mu M.D.   On: 06/03/2017 23:26    MAU Management/MDM: Ordered usual first trimester r/o ectopic labs.   Pelvic exam and cultures done Will check baseline Ultrasound to rule out ectopic.  This bleeding/pain can represent a normal pregnancy with bleeding, spontaneous abortion or even an ectopic which can be life-threatening.  The process as listed above helps to determine which of these is present.  Discussed findings are worrisome for abnormal pregnancy.  Will need to repeat another HCG in 48 hours  ASSESSMENT Pregnancy, unsure gestational age Bleeding in early pregnancy Cannot exclude ectopic just yet  PLAN Discharge home Plan to repeat HCG level in 48 hours in clinic per 08:00 am schedule Will repeat  Ultrasound in about 7-10 days if HCG levels double appropriately  Ectopic precautions   Pt stable at time of discharge. Encouraged to return here or to other Urgent Care/ED if she develops worsening of symptoms, increase in pain, fever, or other concerning symptoms.    Wynelle Bourgeois CNM, MSN Certified Nurse-Midwife 06/03/2017  10:35 PM

## 2017-06-03 NOTE — MAU Note (Signed)
Pt here with c/o vaginal bleeding that started today; was brownish then got to be bright red with some clots then back to brown at this time.

## 2017-06-04 DIAGNOSIS — O208 Other hemorrhage in early pregnancy: Secondary | ICD-10-CM

## 2017-06-04 LAB — HIV ANTIBODY (ROUTINE TESTING W REFLEX): HIV Screen 4th Generation wRfx: NONREACTIVE

## 2017-06-04 LAB — GC/CHLAMYDIA PROBE AMP (~~LOC~~) NOT AT ARMC
Chlamydia: NEGATIVE
Neisseria Gonorrhea: NEGATIVE

## 2017-06-04 NOTE — Discharge Instructions (Signed)

## 2017-06-05 ENCOUNTER — Ambulatory Visit: Payer: Medicaid Other | Admitting: *Deleted

## 2017-06-05 DIAGNOSIS — O3680X0 Pregnancy with inconclusive fetal viability, not applicable or unspecified: Secondary | ICD-10-CM

## 2017-06-05 LAB — RH IG WORKUP (INCLUDES ABO/RH)
ABO/RH(D): O NEG
Antibody Screen: NEGATIVE
Gestational Age(Wks): 5
Unit division: 0

## 2017-06-05 LAB — HCG, QUANTITATIVE, PREGNANCY: hCG, Beta Chain, Quant, S: 852 m[IU]/mL — ABNORMAL HIGH (ref <5)

## 2017-06-05 NOTE — Progress Notes (Signed)
Patient in for stat hcg level. She has pregnancy of unknown location. She reports vaginal bleeding today that is like a period. She denies any pain. She is unable to stay for her results today. She can be reached at [phone]. Advised her that I would discuss results with provider and call her back with plan. Patient is agreeable to this and had no further questions.  Discussed results with Dr. Debroah LoopArnold. He states that patient is likely in process of having a SAB. He recommended that patient is given precautions for return to [facility] and an appt to be seen by MD in 2 weeks. I called patient and gave her this information. She was tearful but voiced understanding. Advised patient that front office staff would call her with appt. Advised her to return to MAU if she develops heavy bleeding, or pain. Patient is agreeable to this.

## 2017-06-09 ENCOUNTER — Encounter: Payer: Self-pay | Admitting: *Deleted

## 2017-06-18 ENCOUNTER — Encounter: Payer: Self-pay | Admitting: Certified Nurse Midwife

## 2017-06-23 ENCOUNTER — Ambulatory Visit: Payer: Medicaid Other | Admitting: Certified Nurse Midwife

## 2017-08-20 ENCOUNTER — Other Ambulatory Visit: Payer: Self-pay

## 2017-08-20 ENCOUNTER — Emergency Department (HOSPITAL_COMMUNITY)
Admission: EM | Admit: 2017-08-20 | Discharge: 2017-08-20 | Disposition: A | Discharge status: Home / Self Care | Payer: Medicaid Other | Attending: Emergency Medicine | Admitting: Emergency Medicine

## 2017-08-20 ENCOUNTER — Encounter (HOSPITAL_COMMUNITY): Payer: Self-pay

## 2017-08-20 DIAGNOSIS — F17200 Nicotine dependence, unspecified, uncomplicated: Secondary | ICD-10-CM | POA: Insufficient documentation

## 2017-08-20 DIAGNOSIS — L02412 Cutaneous abscess of left axilla: Secondary | ICD-10-CM | POA: Insufficient documentation

## 2017-08-20 DIAGNOSIS — L0291 Cutaneous abscess, unspecified: Secondary | ICD-10-CM

## 2017-08-20 MED ORDER — LIDOCAINE HCL (PF) 1 % IJ SOLN
10.0000 mL | Freq: Once | INTRAMUSCULAR | Status: AC
  Administered 2017-08-20: 10 mL
  Filled 2017-08-20: qty 10

## 2017-08-20 NOTE — ED Notes (Signed)
ED Provider at bedside. 

## 2017-08-20 NOTE — Discharge Instructions (Addendum)
Please read and follow all provided instructions.  You were seen here today for an Abscess to the left axilla. For this, an incision and drainage (aka an I&D) to the affected area was done today. An I&D is a surgical procedure to open and drain a fluid-filled sac that may be filled with pus, mucus, or blood. Examples of fluid-filled sacs that may need surgical drainage include cysts, skin infections (abscesses), and red lumps that develop from a ruptured cyst or a small abscess (boils).  Home instructions   Keep wound clean and dry. Apply warm compresses to the left axilla throughout the day. It will continue to drain over the follow days.   Follow Up:  [facility] for wound recheck.  Return to emergency department for emergent changing or worsening symptoms.  Return instructions:  Return to the Emergency Department if you have: Fever You have more redness, swelling, or pain around your incision.  Your incision feels warm to touch Redness of the skin that moves away from the affected area, especially if it streaks away from the affected area  The area where the incision and drainage occurred becomes numb or it tingles. Any other emergent concerns   Your vital signs today were: BP 127/63 (BP Location: Right Arm)    Pulse 94    Temp 97.6 F (36.4 C) (Oral)    Resp 18    LMP 07/24/2017    SpO2 100%  If your blood pressure (BP) was elevated above 135/85 this visit, please have this repeated by your doctor within one month. ---------------

## 2017-08-20 NOTE — ED Notes (Signed)
Patient left at this time with all belongings. 

## 2017-08-20 NOTE — ED Provider Notes (Signed)
[facility]ENC[facility] Provider Note   CSN: [id number] Arrival date & time: 08/20/17  2057     History   Chief Complaint Chief Complaint  Patient presents with  . Recurrent Skin Infections    HPI Brooke Carrillo is a 31 y.o. female who presents the emergency department today for painful lump underneath arm.  Patient states for the last 2 days she has been having increased pain, swelling underneath her left armpit.  She states she has had these in the past but never seen anybody for this.  No treatment PTA. Patient denies any drainage from the area.  She is without fever, chills, nausea or vomiting at home.  No IV drug use.  HPI  Past Medical History:  Diagnosis Date  . Medical history non-contributory   . No pertinent past medical history   . Pregnancy as incidental finding     Patient Active Problem List   Diagnosis Date Noted  . Encounter for initial prescription of transdermal patch hormonal contraceptive device 08/27/2016    Past Surgical History:  Procedure Laterality Date  . ORIF FINGER / THUMB FRACTURE    . WISDOM TOOTH EXTRACTION      OB History    Gravida Para Term Preterm AB Living   7 3 3  0 3 3   SAB TAB Ectopic Multiple Live Births   2 1 0 0 3       Home Medications    Prior to Admission medications   Not on File    Family History Family History  Problem Relation Age of Onset  . Hypertension Father   . Dementia Mother     Social History Social History   Tobacco Use  . Smoking status: Current Every Day Smoker    Packs/day: 0.50  . Smokeless tobacco: Never Used  Substance Use Topics  . Alcohol use: No    Alcohol/week: 0.0 oz  . Drug use: No     Allergies   Patient has no known allergies.   Review of Systems Review of Systems  Constitutional: Negative for chills and fever.  Gastrointestinal: Negative for nausea and vomiting.  Skin:       Abscess  All other systems reviewed and are  negative.    Physical Exam Updated Vital Signs BP 127/63 (BP Location: Right Arm)   Pulse 94   Temp 97.6 F (36.4 C) (Oral)   Resp 18   LMP 07/24/2017   SpO2 100%   Physical Exam  Constitutional: She appears well-developed and well-nourished.  HENT:  Head: Normocephalic and atraumatic.  Right Ear: External ear normal.  Left Ear: External ear normal.  Eyes: Conjunctivae are normal. Right eye exhibits no discharge. Left eye exhibits no discharge. No scleral icterus.  Pulmonary/Chest: Effort normal. No respiratory distress.  Neurological: She is alert.  Skin: Skin is warm and dry. Capillary refill takes less than 2 seconds. No pallor.  There is a 3.5 cm x 3 cm with surrounding induration.  Minimal overlying erythema and heat. No drainage.   Psychiatric: She has a normal mood and affect.  Nursing note and vitals reviewed.    ED Treatments / Results  Labs (all labs ordered are listed, but only abnormal results are displayed) Labs Reviewed - No data to display  EKG  EKG Interpretation None       Radiology No results found.  Procedures .Marland Kitchen.Incision and Drainage Date/Time: 08/20/2017 11:22 PM Performed by: Jacinto HalimMaczis, Lawyer Washabaugh M, PA-C Authorized by: Jacinto HalimMaczis, Kamee Bobst M,  PA-C   Consent:    Consent obtained:  Verbal   Consent given by:  Patient   Risks discussed:  Bleeding, damage to other organs, incomplete drainage, infection and pain   Alternatives discussed:  No treatment and referral Location:    Type:  Abscess   Size:  3cm x 3.5cm   Location:  Upper extremity   Upper extremity location: axilla. Pre-procedure details:    Skin preparation:  Chloraprep Anesthesia (see MAR for exact dosages):    Anesthesia method:  Local infiltration   Local anesthetic:  Lidocaine 1% w/o epi Procedure type:    Complexity:  Simple Procedure details:    Needle aspiration: no     Incision types:  Single straight   Scalpel blade:  11   Wound management:  Probed and deloculated and  irrigated with saline   Drainage:  Bloody and purulent   Drainage amount:  Moderate   Wound treatment:  Wound left open   Packing materials:  None Post-procedure details:    Patient tolerance of procedure:  Tolerated well, no immediate complications   (including critical care time)  Medications Ordered in ED Medications  lidocaine (PF) (XYLOCAINE) 1 % injection 10 mL (not administered)     Initial Impression / Assessment and Plan / ED Course  I have reviewed the triage vital signs and the nursing notes.  Pertinent labs & imaging results that were available during my care of the patient were reviewed by me and considered in my medical decision making (see chart for details).     Patient with skin abscess amenable to incision and drainage.  Abscess was not large enough to warrant packing or drain,  wound recheck in 2 days. Encouraged home warm soaks and flushing.  Mild signs of cellulitis is surrounding skin.  Will d/c to home.  No antibiotic therapy is indicated.  Final Clinical Impressions(s) / ED Diagnoses   Final diagnoses:  Abscess    ED Discharge Orders    None       Princella PellegriniMaczis, Jarae Nemmers M, PA-C 08/20/17 2325    Tilden Fossaees, Elizabeth, MD 08/21/17 [phone]

## 2017-08-20 NOTE — ED Triage Notes (Signed)
Patient arrives to ED via POV; patient states she lump under left arm pit that has increased in pain x 2 days; pt states she has had sx before but never this bad; pt states pain at 10/10 on arrival. Pt a&ox 4-Monique,RN

## 2017-09-29 NOTE — L&D Delivery Note (Addendum)
Patient is [age] y.o. [id number] 2540w4d who delivered a viable newborn female via NSVD at [id number] on 05/04/18. Head delivered LOA. Complication included shoulder dystocia, body was delivered through McRoberts and fundal pressure, approx 30 seconds. Compound right hand, nuchal x1.  Apgars 9/9. Infant with spontaneous cry, placed on mother's abdomen, dried and bulb suctioned. Cord clamped x 2 after 1-minute delay, and cut by family member. Cord blood drawn. Placenta delivered spontaneously with gentle cord traction. Fundus firm with massage and IV Pitocin. Perineum inspected and found to have no lacerations.  Newborn complications include: shoulder dystocia, thick mec  Delivery information: Delivery Note At 10:52 AM a viable female was delivered via  (Presentation: LOA;  ).  weight pending kangaroo care .   Placenta status: spontaneous, 3VC  APGAR (1 MIN): 9   APGAR (5 MINS): 9   APGAR (10 MINS):     Anesthesia:  epidural Episiotomy:  none Lacerations:  none Suture Repair: None Est. Blood Loss (mL):  150ml  Mom to postpartum.  Baby to Couplet care / Skin to Skin.  Candis Schatzatricia Dell 05/04/2018, 11:07 AM     --------------------------------- Candis SchatzPatricia Dell, DO Family Medicine, PGY-3   OB FELLOW DELIVERY ATTESTATION  I was gloved and present for the delivery in its entirety, and I agree with the above resident's note.    Approximately 30 second shoulder dystocia delivered with McRobert's and fundal pressure. Noted to have nuchal x1, easily reduced after delivery of the body.   Marcy Sirenatherine Presli Fanguy, D.O. OB Fellow  05/04/2018, 1:48 PM

## 2017-10-01 ENCOUNTER — Ambulatory Visit: Payer: Self-pay | Admitting: Pediatrics

## 2017-10-01 ENCOUNTER — Encounter: Payer: Self-pay | Admitting: *Deleted

## 2017-10-01 DIAGNOSIS — N912 Amenorrhea, unspecified: Secondary | ICD-10-CM

## 2017-10-01 DIAGNOSIS — Z3201 Encounter for pregnancy test, result positive: Secondary | ICD-10-CM

## 2017-10-01 LAB — POCT URINE PREGNANCY: Preg Test, Ur: POSITIVE — AB

## 2017-10-01 NOTE — Progress Notes (Signed)
I have reviewed this chart and agree with the RN/CMA assessment and management.    K. Meryl Davis, M.D. Attending Obstetrician & Gynecologist, [facility], [facility]  

## 2017-10-01 NOTE — Progress Notes (Signed)
Pt in office today for pregnancy confirmation.  She reports LMP: 07/24/17, which makes her 9 5/7 GA, EDD 05/01/18.  Samples of PNV given to patient at today's OV.  NOB to be scheduled.

## 2017-10-13 ENCOUNTER — Other Ambulatory Visit (HOSPITAL_COMMUNITY)
Admission: RE | Admit: 2017-10-13 | Discharge: 2017-10-13 | Disposition: A | Discharge status: Home / Self Care | Payer: Medicaid Other | Source: Ambulatory Visit | Attending: Certified Nurse Midwife | Admitting: Certified Nurse Midwife

## 2017-10-13 ENCOUNTER — Encounter: Payer: Self-pay | Admitting: Certified Nurse Midwife

## 2017-10-13 ENCOUNTER — Ambulatory Visit (INDEPENDENT_AMBULATORY_CARE_PROVIDER_SITE_OTHER): Payer: Medicaid Other | Admitting: Certified Nurse Midwife

## 2017-10-13 VITALS — BP 118/76 | HR 102 | Wt 155.9 lb

## 2017-10-13 DIAGNOSIS — O26899 Other specified pregnancy related conditions, unspecified trimester: Secondary | ICD-10-CM

## 2017-10-13 DIAGNOSIS — Z3480 Encounter for supervision of other normal pregnancy, unspecified trimester: Secondary | ICD-10-CM | POA: Insufficient documentation

## 2017-10-13 DIAGNOSIS — Z6791 Unspecified blood type, Rh negative: Secondary | ICD-10-CM | POA: Insufficient documentation

## 2017-10-13 DIAGNOSIS — O219 Vomiting of pregnancy, unspecified: Secondary | ICD-10-CM

## 2017-10-13 DIAGNOSIS — Z3481 Encounter for supervision of other normal pregnancy, first trimester: Secondary | ICD-10-CM | POA: Diagnosis not present

## 2017-10-13 DIAGNOSIS — Z720 Tobacco use: Secondary | ICD-10-CM | POA: Insufficient documentation

## 2017-10-13 MED ORDER — OB COMPLETE PETITE 35-5-1-200 MG PO CAPS: 1.0000 | ORAL_CAPSULE | Freq: Every day | ORAL | 12 refills | Status: DC

## 2017-10-13 MED ORDER — DOXYLAMINE-PYRIDOXINE 10-10 MG PO TBEC: DELAYED_RELEASE_TABLET | ORAL | 4 refills | Status: DC

## 2017-10-13 MED ORDER — PROMETHAZINE HCL 12.5 MG PO TABS: 12.5000 mg | ORAL_TABLET | Freq: Four times a day (QID) | ORAL | 0 refills | Status: DC | PRN

## 2017-10-13 NOTE — Progress Notes (Signed)
Pt c/o extreme nausea and lower abdominal pain

## 2017-10-13 NOTE — Progress Notes (Signed)
Subjective:   Brooke Carrillo is a 32 y.o. Z6X0960 at [redacted]w[redacted]d by LMP being seen today for her first obstetrical visit.  Her obstetrical history is significant for smoker and hx of multiple 1st trimester SABs. Patient does intend to breast feed. Pregnancy history fully reviewed.  Patient reports nausea, no bleeding, no contractions, no cramping, no leaking and vomiting.  HISTORY: Obstetric History   G8   P3   T3   P0   A4   L3    SAB3   TAB1   Ectopic0   Multiple0   Live Births3     # Outcome Date GA Lbr Len/2nd Weight Sex Delivery Anes PTL Lv  8 Current           7 SAB 05/2017          6 Term 01/03/15 [redacted]w[redacted]d 01:53 / 00:11 7 lb 11.1 oz (3.49 kg) F Vag-Spont EPI  LIV     Apgar1:  9                Apgar5: 9  5 Term 02/24/12 [redacted]w[redacted]d 410:00 / 00:16 7 lb 13.9 oz (3.57 kg) F Vag-Spont EPI  LIV     Name: RICQUEL, FOULK     Apgar1:  9                Apgar5: 9  4 SAB 2011          3 SAB 2009          2 Term 11/10/06 [redacted]w[redacted]d  7 lb 6 oz (3.345 kg) F Vag-Spont EPI N LIV  1 TAB 10/2004             Past Medical History:  Diagnosis Date  . Medical history non-contributory   . No pertinent past medical history   . Pregnancy as incidental finding    Past Surgical History:  Procedure Laterality Date  . ORIF FINGER / THUMB FRACTURE    . WISDOM TOOTH EXTRACTION     Family History  Problem Relation Age of Onset  . Hypertension Father   . Stroke Father   . Heart disease Father   . Dementia Mother   . Cancer Maternal Aunt        Breast  . HIV Maternal Aunt   . Stroke Maternal Uncle   . Cancer Paternal Uncle        Colon  . Cancer Maternal Grandmother   . Hypertension Paternal Grandmother   . Cancer Paternal Grandfather        Colon   Social History   Tobacco Use  . Smoking status: Current Every Day Smoker    Packs/day: 0.25    Types: Cigarettes  . Smokeless tobacco: Never Used  Substance Use Topics  . Alcohol use: No    Alcohol/week: 0.0 oz  . Drug use: No   No Known  Allergies No current outpatient medications on file prior to visit.   No current facility-administered medications on file prior to visit.      Exam   Vitals:   10/13/17 1437  BP: 118/76  Pulse: (!) 102  Weight: 155 lb 14.4 oz (70.7 kg)   Fetal Heart Rate (bpm): 165; doppler  Uterus:     Pelvic Exam: Perineum: no hemorrhoids, normal perineum   Vulva: normal external genitalia, no lesions   Vagina:  normal mucosa, normal discharge   Cervix: no lesions and normal, pap smear done.    Adnexa: normal adnexa and  no mass, fullness, tenderness   Bony Pelvis: average  System: General: well-developed, well-nourished female in no acute distress   Breast:  normal appearance, no masses or tenderness   Skin: normal coloration and turgor, no rashes   Neurologic: oriented, normal, negative, normal mood   Extremities: normal strength, tone, and muscle mass, ROM of all joints is normal   HEENT PERRLA, extraocular movement intact and sclera clear, anicteric   Mouth/Teeth mucous membranes moist, pharynx normal without lesions and dental hygiene good   Neck supple and no masses   Cardiovascular: regular rate and rhythm   Respiratory:  no respiratory distress, normal breath sounds   Abdomen: soft, non-tender; bowel sounds normal; no masses,  no organomegaly     Assessment:   Pregnancy: [id number] Patient Active Problem List   Diagnosis Date Noted  . Supervision of other normal pregnancy, antepartum 10/13/2017  . Tobacco abuse 10/13/2017  . Rh negative state in antepartum period 10/13/2017     Plan:  1. Supervision of other normal pregnancy, antepartum    - Cervicovaginal ancillary only - Culture, OB Urine - Genetic Screening - Hemoglobin A1c - Hemoglobinopathy evaluation - Obstetric Panel, Including HIV - VITAMIN D 25 Hydroxy (Vit-D Deficiency, Fractures) - Cystic Fibrosis Mutation 97 - US MFM OB COMP + 14 WK; Future - Prenat-FeCbn-FeAspGl-FA-Omega (OB COMPLETE PETITE) 35-5-1-200 MG  CAPS; Take 1 tablet by mouth daily.  Dispense: 30 capsule; Refill: 12  2. Nausea/vomiting in pregnancy    - Doxylamine-Pyridoxine (DICLEGIS) 10-10 MG TBEC; Take 1 tablet with breakfast and lunch.  Take 2 tablets at bedtime.  Dispense: 100 tablet; Refill: 4 - promethazine (PHENERGAN) 12.5 MG tablet; Take 1 tablet (12.5 mg total) by mouth every 6 (six) hours as needed for nausea or vomiting.  Dispense: 30 tablet; Refill: 0  3. Tobacco abuse      Trying to quit, less than a half a pack a day  4. Rh negative state in antepartum period     Rhogam at 28 weeks   Initial labs drawn. Continue prenatal vitamins. Genetic Screening discussed, NIPS: ordered. Ultrasound discussed; fetal anatomic survey: ordered. Problem list reviewed and updated. The nature [facility] with multiple MDs and other Advanced Practice Providers was explained to patient; also emphasized that residents, students are part of our team. Routine obstetric precautions reviewed. Return in about 4 weeks (around 11/10/2017).     Orvilla Cornwallachelle Carmie Lanpher, CNM [facility], [facility]

## 2017-10-14 LAB — CERVICOVAGINAL ANCILLARY ONLY
Bacterial vaginitis: NEGATIVE
Candida vaginitis: NEGATIVE
Chlamydia: NEGATIVE
Neisseria Gonorrhea: NEGATIVE
Trichomonas: NEGATIVE

## 2017-10-14 LAB — VITAMIN D 25 HYDROXY (VIT D DEFICIENCY, FRACTURES): Vit D, 25-Hydroxy: 17.7 ng/mL — ABNORMAL LOW (ref 30.0–100.0)

## 2017-10-15 LAB — OBSTETRIC PANEL, INCLUDING HIV
Antibody Screen: NEGATIVE
Basophils Absolute: 0 10*3/uL (ref 0.0–0.2)
Basos: 0 %
EOS (ABSOLUTE): 0.2 10*3/uL (ref 0.0–0.4)
Eos: 1 %
HIV Screen 4th Generation wRfx: NONREACTIVE
Hematocrit: 39 % (ref 34.0–46.6)
Hemoglobin: 13 g/dL (ref 11.1–15.9)
Hepatitis B Surface Ag: NEGATIVE
Immature Grans (Abs): 0 10*3/uL (ref 0.0–0.1)
Immature Granulocytes: 0 %
Lymphocytes Absolute: 2.6 10*3/uL (ref 0.7–3.1)
Lymphs: 23 %
MCH: 32.3 pg (ref 26.6–33.0)
MCHC: 33.3 g/dL (ref 31.5–35.7)
MCV: 97 fL (ref 79–97)
Monocytes Absolute: 0.5 10*3/uL (ref 0.1–0.9)
Monocytes: 5 %
Neutrophils Absolute: 8.3 10*3/uL — ABNORMAL HIGH (ref 1.4–7.0)
Neutrophils: 71 %
Platelets: 354 10*3/uL (ref 150–379)
RBC: 4.02 x10E6/uL (ref 3.77–5.28)
RDW: 12.2 % — ABNORMAL LOW (ref 12.3–15.4)
RPR Ser Ql: NONREACTIVE
Rh Factor: NEGATIVE
Rubella Antibodies, IGG: 2.25 index (ref >0.99)
WBC: 11.6 10*3/uL — ABNORMAL HIGH (ref 3.4–10.8)

## 2017-10-15 LAB — HEMOGLOBINOPATHY EVALUATION
HGB C: 0 %
HGB S: 0 %
HGB VARIANT: 0 %
Hemoglobin A2 Quantitation: 2.1 % (ref 1.8–3.2)
Hemoglobin F Quantitation: 0 % (ref 0.0–2.0)
Hgb A: 97.9 % (ref 96.4–98.8)

## 2017-10-15 LAB — URINE CULTURE, OB REFLEX

## 2017-10-15 LAB — CULTURE, OB URINE

## 2017-10-15 LAB — HEMOGLOBIN A1C
Est. average glucose Bld gHb Est-mCnc: 97 mg/dL
Hgb A1c MFr Bld: 5 % (ref 4.8–5.6)

## 2017-10-20 LAB — CYSTIC FIBROSIS MUTATION 97: Interpretation: NOT DETECTED

## 2017-10-21 ENCOUNTER — Other Ambulatory Visit: Payer: Self-pay | Admitting: Certified Nurse Midwife

## 2017-10-21 DIAGNOSIS — Z3480 Encounter for supervision of other normal pregnancy, unspecified trimester: Secondary | ICD-10-CM

## 2017-10-21 DIAGNOSIS — E559 Vitamin D deficiency, unspecified: Secondary | ICD-10-CM

## 2017-10-21 MED ORDER — VITAMIN D (ERGOCALCIFEROL) 1.25 MG (50000 UNIT) PO CAPS: 50000.0000 [IU] | ORAL_CAPSULE | ORAL | 2 refills | Status: DC

## 2017-10-29 ENCOUNTER — Other Ambulatory Visit: Payer: Self-pay | Admitting: Certified Nurse Midwife

## 2017-10-29 ENCOUNTER — Encounter: Payer: Self-pay | Admitting: Certified Nurse Midwife

## 2017-10-29 DIAGNOSIS — Z3480 Encounter for supervision of other normal pregnancy, unspecified trimester: Secondary | ICD-10-CM

## 2017-11-10 ENCOUNTER — Other Ambulatory Visit: Payer: Self-pay

## 2017-11-10 ENCOUNTER — Ambulatory Visit (INDEPENDENT_AMBULATORY_CARE_PROVIDER_SITE_OTHER): Payer: Medicaid Other | Admitting: Certified Nurse Midwife

## 2017-11-10 DIAGNOSIS — Z3480 Encounter for supervision of other normal pregnancy, unspecified trimester: Secondary | ICD-10-CM

## 2017-11-10 NOTE — Progress Notes (Signed)
Burning pain on right side 9/10 x 3 days. Dizziness.  Only drinks 3 bottles of water each day.

## 2017-11-10 NOTE — Progress Notes (Signed)
   PRENATAL VISIT NOTE  Subjective:  Brooke Carrillo is a 32 y.o. [id number] at 2962w4d being seen today for ongoing prenatal care.  She is currently monitored for the following issues for this low-risk pregnancy and has Supervision of other normal pregnancy, antepartum; Tobacco abuse; Rh negative state in antepartum period; and Vitamin D deficiency on their problem list.  Patient reports no complaints.  Contractions: Not present. Vag. Bleeding: None.   . Denies leaking of fluid.   The following portions of the patient's history were reviewed and updated as appropriate: allergies, current medications, past family history, past medical history, past social history, past surgical history and problem list. Problem list updated.  Objective:   Vitals:   11/10/17 1103  BP: 115/66  Pulse: 90  Weight: 158 lb 12.8 oz (72 kg)    Fetal Status: Fetal Heart Rate (bpm): 136; doppler         General:  Alert, oriented and cooperative. Patient is in no acute distress.  Skin: Skin is warm and dry. No rash noted.   Cardiovascular: Normal heart rate noted  Respiratory: Normal respiratory effort, no problems with respiration noted  Abdomen: Soft, gravid, appropriate for gestational age.  Pain/Pressure: Present     Pelvic: Cervical exam deferred        Extremities: Normal range of motion.  Edema: None  Mental Status:  Normal mood and affect. Normal behavior. Normal judgment and thought content.   Assessment and Plan:  Pregnancy: [id number] at 762w4d  1. Supervision of other normal pregnancy, antepartum     Doing well.   - Culture, OB Urine - AFP, Serum, Open Spina Bifida  Preterm labor symptoms and general obstetric precautions including but not limited to vaginal bleeding, contractions, leaking of fluid and fetal movement were reviewed in detail with the patient. Please refer to After Visit Summary for other counseling recommendations.  Return in about 4 weeks (around 12/08/2017) for ROB.   Roe Coombsachelle A  Judine Arciniega, CNM

## 2017-11-12 LAB — URINE CULTURE, OB REFLEX

## 2017-11-12 LAB — CULTURE, OB URINE

## 2017-11-13 LAB — AFP, SERUM, OPEN SPINA BIFIDA
AFP MoM: 1.45
AFP Value: 41.8 ng/mL
Gest. Age on Collection Date: 15.6 weeks
Maternal Age At EDD: 31.6 yr
OSBR Risk 1 IN: 3133
Test Results:: NEGATIVE
Weight: 158 [lb_av]

## 2017-11-14 ENCOUNTER — Other Ambulatory Visit: Payer: Self-pay | Admitting: Certified Nurse Midwife

## 2017-11-14 DIAGNOSIS — Z3480 Encounter for supervision of other normal pregnancy, unspecified trimester: Secondary | ICD-10-CM

## 2017-11-30 ENCOUNTER — Encounter (HOSPITAL_COMMUNITY): Payer: Self-pay | Admitting: Certified Nurse Midwife

## 2017-12-08 ENCOUNTER — Other Ambulatory Visit: Payer: Self-pay | Admitting: Certified Nurse Midwife

## 2017-12-08 ENCOUNTER — Ambulatory Visit (HOSPITAL_COMMUNITY)
Admission: RE | Admit: 2017-12-08 | Discharge: 2017-12-08 | Disposition: A | Discharge status: Home / Self Care | Payer: Medicaid Other | Source: Ambulatory Visit | Attending: Certified Nurse Midwife | Admitting: Certified Nurse Midwife

## 2017-12-08 ENCOUNTER — Encounter: Payer: Self-pay | Admitting: Certified Nurse Midwife

## 2017-12-08 ENCOUNTER — Ambulatory Visit (INDEPENDENT_AMBULATORY_CARE_PROVIDER_SITE_OTHER): Payer: Medicaid Other | Admitting: Certified Nurse Midwife

## 2017-12-08 VITALS — BP 117/68 | HR 94 | Wt 161.6 lb

## 2017-12-08 DIAGNOSIS — Z3480 Encounter for supervision of other normal pregnancy, unspecified trimester: Secondary | ICD-10-CM

## 2017-12-08 DIAGNOSIS — E559 Vitamin D deficiency, unspecified: Secondary | ICD-10-CM

## 2017-12-08 DIAGNOSIS — Z3A19 19 weeks gestation of pregnancy: Secondary | ICD-10-CM | POA: Diagnosis not present

## 2017-12-08 DIAGNOSIS — Z3689 Encounter for other specified antenatal screening: Secondary | ICD-10-CM | POA: Insufficient documentation

## 2017-12-08 DIAGNOSIS — O09899 Supervision of other high risk pregnancies, unspecified trimester: Secondary | ICD-10-CM

## 2017-12-08 DIAGNOSIS — Z6791 Unspecified blood type, Rh negative: Secondary | ICD-10-CM

## 2017-12-08 DIAGNOSIS — O26899 Other specified pregnancy related conditions, unspecified trimester: Secondary | ICD-10-CM

## 2017-12-08 NOTE — Progress Notes (Signed)
   PRENATAL VISIT NOTE  Subjective:  Brooke Carrillo is a 32 y.o. [phone] [id number] at 3868w4d being seen today for ongoing prenatal care.  She is currently monitored for the following issues for this low-risk pregnancy and has Supervision of other normal pregnancy, antepartum; Tobacco abuse; Rh negative state in antepartum period; and Vitamin D deficiency on their problem list.  Patient reports no complaints.  Contractions: Not present. Vag. Bleeding: None.  Movement: Present. Denies leaking of fluid.   The following portions of the patient's history were reviewed and updated as appropriate: allergies, current medications, past family history, past medical history, past social history, past surgical history and problem list. Problem list updated.  Objective:   Vitals:   12/08/17 0951  BP: 117/68  Pulse: 94  Weight: 161 lb 9.6 oz (73.3 kg)    Fetal Status: Fetal Heart Rate (bpm): 146; doppler Fundal Height: 19 cm Movement: Present     General:  Alert, oriented and cooperative. Patient is in no acute distress.  Skin: Skin is warm and dry. No rash noted.   Cardiovascular: Normal heart rate noted  Respiratory: Normal respiratory effort, no problems with respiration noted  Abdomen: Soft, gravid, appropriate for gestational age.  Pain/Pressure: Present     Pelvic: Cervical exam deferred        Extremities: Normal range of motion.  Edema: None  Mental Status:  Normal mood and affect. Normal behavior. Normal judgment and thought content.   Assessment and Plan:  Pregnancy: [id number] at 368w4d  1. Supervision of other normal pregnancy, antepartum      Doing well.    2. Vitamin D deficiency      Vitamin D weekly  3. Rh negative state in antepartum period     Rhogam @28  weeks.   Preterm labor symptoms and general obstetric precautions including but not limited to vaginal bleeding, contractions, leaking of fluid and fetal movement were reviewed in detail with the patient. Please refer to After Visit  Summary for other counseling recommendations.  Return in about 4 weeks (around 01/05/2018) for ROB.   Roe Coombsachelle A Fumie Fiallo, CNM

## 2017-12-08 NOTE — Progress Notes (Signed)
Patient reports fetal movement with sided right sided pain

## 2017-12-11 ENCOUNTER — Other Ambulatory Visit: Payer: Self-pay | Admitting: Certified Nurse Midwife

## 2017-12-11 DIAGNOSIS — Z3480 Encounter for supervision of other normal pregnancy, unspecified trimester: Secondary | ICD-10-CM

## 2018-01-05 ENCOUNTER — Ambulatory Visit (INDEPENDENT_AMBULATORY_CARE_PROVIDER_SITE_OTHER): Payer: Medicaid Other | Admitting: Certified Nurse Midwife

## 2018-01-05 ENCOUNTER — Encounter: Payer: Self-pay | Admitting: Certified Nurse Midwife

## 2018-01-05 VITALS — BP 130/69 | HR 105 | Wt 170.0 lb

## 2018-01-05 DIAGNOSIS — O26899 Other specified pregnancy related conditions, unspecified trimester: Secondary | ICD-10-CM

## 2018-01-05 DIAGNOSIS — Z3482 Encounter for supervision of other normal pregnancy, second trimester: Secondary | ICD-10-CM

## 2018-01-05 DIAGNOSIS — Z3480 Encounter for supervision of other normal pregnancy, unspecified trimester: Secondary | ICD-10-CM

## 2018-01-05 DIAGNOSIS — O26892 Other specified pregnancy related conditions, second trimester: Secondary | ICD-10-CM

## 2018-01-05 DIAGNOSIS — Z6791 Unspecified blood type, Rh negative: Secondary | ICD-10-CM

## 2018-01-05 DIAGNOSIS — E559 Vitamin D deficiency, unspecified: Secondary | ICD-10-CM

## 2018-01-05 NOTE — Patient Instructions (Signed)
Before [facility] Before your baby arrives it is important to:  Have all of the supplies that you will need to care for your baby.  Know where to go if there is an emergency.  Discuss the baby's arrival with other family members.  What supplies will I need?  It is recommended that you have the following supplies: Large Items  Crib.  Crib mattress.  Rear-facing infant car seat. If possible, have a trained professional check to make sure that it is installed correctly.  Feeding  6-8 bottles that are 4-5 oz in size.  6-8 nipples.  Bottle brush.  Sterilizer, or a large pan or kettle with a lid.  A way to boil and cool water.  If you will be breastfeeding: ? Breast pump. ? Nipple cream. ? Nursing bra. ? Breast pads. ? Breast shields.  If you will be formula feeding: ? Formula. ? Measuring cups. ? Measuring spoons.  Bathing  Mild baby soap and baby shampoo.  Petroleum jelly.  Soft cloth towel and washcloth.  Hooded towel.  Cotton balls.  Bath basin.  Other Supplies  Rectal thermometer.  Bulb syringe.  Baby wipes or washcloths for diaper changes.  Diaper bag.  Changing pad.  Clothing, including one-piece outfits and pajamas.  Baby nail clippers.  Receiving blankets.  Mattress pad and sheets for the crib.  Night-light for the baby's room.  Baby monitor.  2 or 3 pacifiers.  Either 24-36 cloth diapers and waterproof diaper covers or a box of disposable diapers. You may need to use as many as 10-12 diapers per day.  How do I prepare for an emergency? Prepare for an emergency by:  Knowing how to get to the nearest hospital.  Listing the phone numbers of your baby's health care providers near your home phone and in your cell phone.  How do I prepare my family?  Decide how to handle visitors.  If you have other children: ? Talk with them about the baby coming home. Ask them how they feel about it. ? Read a book together about  being a new big brother or sister. ? Find ways to let them help you prepare for the new baby. ? Have someone ready to care for them while you are in the hospital. This information is not intended to replace advice given to you by your health care provider. Make sure you discuss any questions you have with your health care provider. Document Released: 08/28/2008 Document Revised: 02/21/2016 Document Reviewed: 08/23/2014 Elsevier Interactive Patient Education  2018 [employer].  [employer] of the uterus can occur throughout pregnancy, but they are not always a sign that you are in labor. You may have practice contractions called Braxton Hicks contractions. These false labor contractions are sometimes confused with true labor. What are [place] contractions? Braxton Hicks contractions are tightening movements that occur in the muscles of the uterus before labor. Unlike true labor contractions, these contractions do not result in opening (dilation) and thinning of the cervix. Toward the end of pregnancy (32-34 weeks), Braxton Hicks contractions can happen more often and may become stronger. These contractions are sometimes difficult to tell apart from true labor because they can be very uncomfortable. You should not feel embarrassed if you go to the hospital with false labor. Sometimes, the only way to tell if you are in true labor is for your health care provider to look for changes in the cervix. The health care provider will do a  physical exam and may monitor your contractions. If you are not in true labor, the exam should show that your cervix is not dilating and your water has not broken. If there are other health problems associated with your pregnancy, it is completely safe for you to be sent home with false labor. You may continue to have Braxton Hicks contractions until you go into true labor. How to tell the difference between true labor and false labor True  labor  Contractions last 30-70 seconds.  Contractions become very regular.  Discomfort is usually felt in the top of the uterus, and it spreads to the lower abdomen and low back.  Contractions do not go away with walking.  Contractions usually become more intense and increase in frequency.  The cervix dilates and gets thinner. False labor  Contractions are usually shorter and not as strong as true labor contractions.  Contractions are usually irregular.  Contractions are often felt in the front of the lower abdomen and in the groin.  Contractions may go away when you walk around or change positions while lying down.  Contractions get weaker and are shorter-lasting as time goes on.  The cervix usually does not dilate or become thin. Follow these instructions at home:  Take over-the-counter and prescription medicines only as told by your health care provider.  Keep up with your usual exercises and follow other instructions from your health care provider.  Eat and drink lightly if you think you are going into labor.  If Braxton Hicks contractions are making you uncomfortable: ? Change your position from lying down or resting to walking, or change from walking to resting. ? Sit and rest in a tub of warm water. ? Drink enough fluid to keep your urine pale yellow. Dehydration may cause these contractions. ? Do slow and deep breathing several times an hour.  Keep all follow-up prenatal visits as told by your health care provider. This is important. Contact a health care provider if:  You have a fever.  You have continuous pain in your abdomen. Get help right away if:  Your contractions become stronger, more regular, and closer together.  You have fluid leaking or gushing from your vagina.  You pass blood-tinged mucus (bloody show).  You have bleeding from your vagina.  You have low back pain that you never had before.  You feel your baby's head pushing down and  causing pelvic pressure.  Your baby is not moving inside you as much as it used to. Summary  Contractions that occur before labor are called Braxton Hicks contractions, false labor, or practice contractions.  Braxton Hicks contractions are usually shorter, weaker, farther apart, and less regular than true labor contractions. True labor contractions usually become progressively stronger and regular and they become more frequent.  Manage discomfort from [facility] contractions by changing position, resting in a warm bath, drinking plenty of water, or practicing deep breathing. This information is not intended to replace advice given to you by your health care provider. Make sure you discuss any questions you have with your health care provider. Document Released: 01/29/2017 Document Revised: 01/29/2017 Document Reviewed: 01/29/2017 Elsevier Interactive Patient Education  2018 [employer]  Second Trimester of Pregnancy The second trimester is from week 13 through week 28, month 4 through 6. This is often the time in pregnancy that you feel your best. Often times, morning sickness has lessened or quit. You may have more energy, and you may get hungry more often. Your unborn  baby (fetus) is growing rapidly. At the end of the sixth month, he or she is about 9 inches long and weighs about 1 pounds. You will likely feel the baby move (quickening) between 18 and 20 weeks of pregnancy. Follow these instructions at home:  Avoid all smoking, herbs, and alcohol. Avoid drugs not approved by your doctor.  Do not use any tobacco products, including cigarettes, chewing tobacco, and electronic cigarettes. If you need help quitting, ask your doctor. You may get counseling or other support to help you quit.  Only take medicine as told by your doctor. Some medicines are safe and some are not during pregnancy.  Exercise only as told by your doctor. Stop exercising if you start having cramps.  Eat regular,  healthy meals.  Wear a good support bra if your breasts are tender.  Do not use hot tubs, steam rooms, or saunas.  Wear your seat belt when driving.  Avoid raw meat, uncooked cheese, and liter boxes and soil used by cats.  Take your prenatal vitamins.  Take 1500-2000 milligrams of calcium daily starting at the 20th week of pregnancy until you deliver your baby.  Try taking medicine that helps you poop (stool softener) as needed, and if your doctor approves. Eat more fiber by eating fresh fruit, vegetables, and whole grains. Drink enough fluids to keep your pee (urine) clear or pale yellow.  Take warm water baths (sitz baths) to soothe pain or discomfort caused by hemorrhoids. Use hemorrhoid cream if your doctor approves.  If you have puffy, bulging veins (varicose veins), wear support hose. Raise (elevate) your feet for 15 minutes, 3-4 times a day. Limit salt in your diet.  Avoid heavy lifting, wear low heals, and sit up straight.  Rest with your legs raised if you have leg cramps or low back pain.  Visit your dentist if you have not gone during your pregnancy. Use a soft toothbrush to brush your teeth. Be gentle when you floss.  You can have sex (intercourse) unless your doctor tells you not to.  Go to your doctor visits. Get help if:  You feel dizzy.  You have mild cramps or pressure in your lower belly (abdomen).  You have a nagging pain in your belly area.  You continue to feel sick to your stomach (nauseous), throw up (vomit), or have watery poop (diarrhea).  You have bad smelling fluid coming from your vagina.  You have pain with peeing (urination). Get help right away if:  You have a fever.  You are leaking fluid from your vagina.  You have spotting or bleeding from your vagina.  You have severe belly cramping or pain.  You lose or gain weight rapidly.  You have trouble catching your breath and have chest pain.  You notice sudden or extreme puffiness  (swelling) of your face, hands, ankles, feet, or legs.  You have not felt the baby move in over an hour.  You have severe headaches that do not go away with medicine.  You have vision changes. This information is not intended to replace advice given to you by your health care provider. Make sure you discuss any questions you have with your health care provider. Document Released: 12/10/2009 Document Revised: 02/21/2016 Document Reviewed: 11/16/2012 Elsevier Interactive Patient Education  2017 Elsevier Inc. Glucose Tolerance Test During Pregnancy The glucose tolerance test (GTT) is a blood test used to determine if you have developed a type of diabetes during pregnancy (gestational diabetes). This is when your  body does not properly process sugar (glucose) in the food you eat, resulting in high blood glucose levels. Typically, a GTT is done after you have had a 1-hour glucose test with results that indicate you possibly have gestational diabetes. It may also be done if:  You have a history of giving birth to very large babies or have experienced repeated fetal loss (stillbirth).  You have signs and symptoms of diabetes, such as: ? Changes in your vision. ? Tingling or numbness in your hands or feet. ? Changes in hunger, thirst, and urination not otherwise explained by your pregnancy.  The GTT lasts about 3 hours. You will be given a sugar-water solution to drink at the beginning of the test. You will have blood drawn before you drink the solution and then again 1, 2, and 3 hours after you drink it. You will not be allowed to eat or drink anything else during the test. You must remain at the testing location to make sure that your blood is drawn on time. You should also avoid exercising during the test, because exercise can alter test results. How do I prepare for this test? Eat normally for 3 days prior to the GTT test, including having plenty of carbohydrate-rich foods. Do not eat or drink  anything except water during the final 12 hours before the test. In addition, your health care provider may ask you to stop taking certain medicines before the test. What do the results mean? It is your responsibility to obtain your test results. Ask the lab or department performing the test when and how you will get your results. Contact your health care provider to discuss any questions you have about your results. Range of Normal Values Ranges for normal values may vary among different labs and hospitals. You should always check with your health care provider after having lab work or other tests done to discuss whether your values are considered within normal limits. Normal levels of blood glucose are as follows:  Fasting: less than 105 mg/dL.  1 hour after drinking the solution: less than 190 mg/dL.  2 hours after drinking the solution: less than 165 mg/dL.  3 hours after drinking the solution: less than 145 mg/dL.  Some substances can interfere with GTT results. These may include:  Blood pressure and heart failure medicines, including beta blockers, furosemide, and thiazides.  Anti-inflammatory medicines, including aspirin.  Nicotine.  Some psychiatric medicines.  Meaning of Results Outside Normal Value Ranges GTT test results that are above normal values may indicate a number of health problems, such as:  Gestational diabetes.  Acute stress response.  Cushing syndrome.  Tumors such as pheochromocytoma or glucagonoma.  Long-term kidney problems.  Pancreatitis.  Hyperthyroidism.  Current infection.  Discuss your test results with your health care provider. He or she will use the results to make a diagnosis and determine a treatment plan that is right for you. This information is not intended to replace advice given to you by your health care provider. Make sure you discuss any questions you have with your health care provider. Document Released: 03/16/2012 Document  Revised: 02/21/2016 Document Reviewed: 01/20/2014 Elsevier Interactive Patient Education  [employer].

## 2018-01-05 NOTE — Progress Notes (Signed)
   PRENATAL VISIT NOTE  Subjective:  Brooke Carrillo is a 32 y.o. [phone] at 3379w4d being seen today for ongoing prenatal care.  She is currently monitored for the following issues for this low-risk pregnancy and has Supervision of other normal pregnancy, antepartum; Tobacco abuse; Rh negative state in antepartum period; and Vitamin D deficiency on their problem list.  Patient reports backache, no bleeding, no contractions, no cramping and no leaking.  Contractions: Not present. Vag. Bleeding: None.  Movement: Present. Denies leaking of fluid.   The following portions of the patient's history were reviewed and updated as appropriate: allergies, current medications, past family history, past medical history, past social history, past surgical history and problem list. Problem list updated.  Objective:   Vitals:   01/05/18 1123  BP: 130/69  Pulse: (!) 105  Weight: 170 lb (77.1 kg)    Fetal Status: Fetal Heart Rate (bpm): 155; doppler Fundal Height: 23 cm Movement: Present     General:  Alert, oriented and cooperative. Patient is in no acute distress.  Skin: Skin is warm and dry. No rash noted.   Cardiovascular: Normal heart rate noted  Respiratory: Normal respiratory effort, no problems with respiration noted  Abdomen: Soft, gravid, appropriate for gestational age.  Pain/Pressure: Present     Pelvic: Cervical exam deferred        Extremities: Normal range of motion.     Mental Status: Normal mood and affect. Normal behavior. Normal judgment and thought content.   Assessment and Plan:  Pregnancy: [id number] at 6479w4d  1. Supervision of other normal pregnancy, antepartum     Doing well. Reports normal pregnancy discomforts.   2. Vitamin D deficiency     Taking weekly vitamin D.   3. Rh negative state in antepartum period     Rhogam planned with 2 hour OGTT.  Preterm labor symptoms and general obstetric precautions including but not limited to vaginal bleeding, contractions, leaking of  fluid and fetal movement were reviewed in detail with the patient. Please refer to After Visit Summary for other counseling recommendations.  Return in about 1 month (around 02/02/2018) for ROB, 2 hr OGTT, Rhogam.  No future appointments.  Roe Coombsachelle A Corion Sherrod, CNM

## 2018-01-20 ENCOUNTER — Other Ambulatory Visit: Payer: Self-pay | Admitting: Certified Nurse Midwife

## 2018-01-20 ENCOUNTER — Telehealth: Payer: Self-pay | Admitting: *Deleted

## 2018-01-20 DIAGNOSIS — K219 Gastro-esophageal reflux disease without esophagitis: Secondary | ICD-10-CM | POA: Insufficient documentation

## 2018-01-20 DIAGNOSIS — O99612 Diseases of the digestive system complicating pregnancy, second trimester: Principal | ICD-10-CM

## 2018-01-20 MED ORDER — OMEPRAZOLE 20 MG PO CPDR: 20.0000 mg | DELAYED_RELEASE_CAPSULE | Freq: Two times a day (BID) | ORAL | 5 refills | Status: DC

## 2018-01-20 NOTE — Telephone Encounter (Signed)
Pt called to office stating she is having severe Reflux and would like Rx.  Please send Rx to [facility] on [address]

## 2018-01-20 NOTE — Telephone Encounter (Signed)
Please let her know that Prilosec was sent to the pharmacy.  Thank you.  Brooke Carrillo

## 2018-01-21 NOTE — Telephone Encounter (Signed)
Left message making pt aware of Rx.

## 2018-02-02 ENCOUNTER — Other Ambulatory Visit: Payer: Medicaid Other

## 2018-02-02 ENCOUNTER — Encounter: Payer: Medicaid Other | Admitting: Certified Nurse Midwife

## 2018-02-04 ENCOUNTER — Ambulatory Visit (INDEPENDENT_AMBULATORY_CARE_PROVIDER_SITE_OTHER): Payer: Medicaid Other | Admitting: Certified Nurse Midwife

## 2018-02-04 ENCOUNTER — Other Ambulatory Visit (HOSPITAL_COMMUNITY)
Admission: RE | Admit: 2018-02-04 | Discharge: 2018-02-04 | Disposition: A | Discharge status: Home / Self Care | Payer: Medicaid Other | Source: Ambulatory Visit | Attending: Certified Nurse Midwife | Admitting: Certified Nurse Midwife

## 2018-02-04 ENCOUNTER — Telehealth: Payer: Self-pay | Admitting: *Deleted

## 2018-02-04 ENCOUNTER — Other Ambulatory Visit: Payer: Medicaid Other

## 2018-02-04 ENCOUNTER — Other Ambulatory Visit: Payer: Self-pay

## 2018-02-04 ENCOUNTER — Telehealth: Payer: Self-pay

## 2018-02-04 VITALS — BP 113/69 | HR 104 | Wt 175.4 lb

## 2018-02-04 DIAGNOSIS — Z3482 Encounter for supervision of other normal pregnancy, second trimester: Secondary | ICD-10-CM

## 2018-02-04 DIAGNOSIS — Z3480 Encounter for supervision of other normal pregnancy, unspecified trimester: Secondary | ICD-10-CM

## 2018-02-04 DIAGNOSIS — Z6791 Unspecified blood type, Rh negative: Secondary | ICD-10-CM

## 2018-02-04 DIAGNOSIS — N949 Unspecified condition associated with female genital organs and menstrual cycle: Secondary | ICD-10-CM

## 2018-02-04 DIAGNOSIS — E559 Vitamin D deficiency, unspecified: Secondary | ICD-10-CM

## 2018-02-04 DIAGNOSIS — O26892 Other specified pregnancy related conditions, second trimester: Secondary | ICD-10-CM

## 2018-02-04 DIAGNOSIS — O360920 Maternal care for other rhesus isoimmunization, second trimester, not applicable or unspecified: Secondary | ICD-10-CM

## 2018-02-04 DIAGNOSIS — O26899 Other specified pregnancy related conditions, unspecified trimester: Secondary | ICD-10-CM

## 2018-02-04 LAB — FETAL FIBRONECTIN: Fetal Fibronectin: POSITIVE — AB

## 2018-02-04 MED ORDER — RHO D IMMUNE GLOBULIN 1500 UNIT/2ML IJ SOSY
300.0000 ug | PREFILLED_SYRINGE | Freq: Once | INTRAMUSCULAR | Status: AC
  Administered 2018-02-04: 300 ug via INTRAMUSCULAR

## 2018-02-04 NOTE — Telephone Encounter (Signed)
Patient called..per orders provider orders, advised of results and need for betamethasone injections.   Advised pt to schedule appt for tomorrow in office, and then go to [facility] on Saturday for the 2nd, pt agreed.

## 2018-02-04 NOTE — Progress Notes (Signed)
   PRENATAL VISIT NOTE  Subjective:  Brooke Carrillo is a 32 y.o. [phone] at [redacted]w[redacted]d being seen today for ongoing prenatal care.  She is currently monitored for the following issues for this low-risk pregnancy and has Supervision of other normal pregnancy, antepartum; Tobacco abuse; Rh negative state in antepartum period; Vitamin D deficiency; and Gastroesophageal reflux during pregnancy, antepartum, second trimester on their problem list.  Patient reports no bleeding, no leaking, occasional contractions, vaginal irritation and vaginal pressure\.  Contractions: Not present. Vag. Bleeding: None.  Movement: Present. Denies leaking of fluid.   The following portions of the patient's history were reviewed and updated as appropriate: allergies, current medications, past family history, past medical history, past social history, past surgical history and problem list. Problem list updated.  Objective:   Vitals:   02/04/18 0906  BP: 113/69  Pulse: (!) 104  Weight: 175 lb 6.4 oz (79.6 kg)    Fetal Status: Fetal Heart Rate (bpm): 140; doppler Fundal Height: 27 cm Movement: Present  Presentation: Vertex  General:  Alert, oriented and cooperative. Patient is in no acute distress.  Skin: Skin is warm and dry. No rash noted.   Cardiovascular: Normal heart rate noted  Respiratory: Normal respiratory effort, no problems with respiration noted  Abdomen: Soft, gravid, appropriate for gestational age.  Pain/Pressure: Present     Pelvic: Cervical exam performed Dilation: Closed Effacement (%): Thick Station: Ballotable, soft  Extremities: Normal range of motion.  Edema: None  Mental Status: Normal mood and affect. Normal behavior. Normal judgment and thought content.       +FFN 02/04/18 Assessment and Plan:  Pregnancy: [id number] at [redacted]w[redacted]d  1. Supervision of other normal pregnancy, antepartum     - Glucose Tolerance, 2 Hours w/1 Hour - CBC - HIV antibody (with reflex) - RPR - Cervicovaginal ancillary  only  2. Vitamin D deficiency     Taking weekly vitamin D  3. Rh negative state in antepartum period     Rhogam given - rho (d) immune globulin (RHIG/RHOPHYLAC) injection 300 mcg  4. Pelvic pressure in pregnancy, antepartum, second trimester      Onset 3 weeks ago, activity makes the pelvic pressure/pain worse.  - Fetal fibronectin  Preterm labor symptoms and general obstetric precautions including but not limited to vaginal bleeding, contractions, leaking of fluid and fetal movement were reviewed in detail with the patient. Please refer to After Visit Summary for other counseling recommendations.  Return in about 2 weeks (around 02/18/2018) for ROB.  Future Appointments  Date Time Provider Department Center  02/18/2018  3:15 PM Roe Coombs, CNM [facility] None    Roe Coombs, CNM

## 2018-02-04 NOTE — Telephone Encounter (Signed)
Attempt to contact pt, per [facility] request, pt has +FFN from today's visit. Pt needs to come in to office for Betamethasone injections.   LM on VM to contact office re: lab results.

## 2018-02-04 NOTE — Progress Notes (Signed)
ROB/GTT.  Declined TDAP.  C/o vaginal pain 10/10 x 3 weeks. RHo given in RUOQ, tolerated well.

## 2018-02-05 ENCOUNTER — Encounter (HOSPITAL_COMMUNITY): Payer: Self-pay | Admitting: *Deleted

## 2018-02-05 ENCOUNTER — Ambulatory Visit (INDEPENDENT_AMBULATORY_CARE_PROVIDER_SITE_OTHER): Payer: Medicaid Other | Admitting: *Deleted

## 2018-02-05 ENCOUNTER — Inpatient Hospital Stay (HOSPITAL_COMMUNITY)
Admission: AD | Admit: 2018-02-05 | Discharge: 2018-02-06 | Disposition: A | Discharge status: Home / Self Care | Payer: Medicaid Other | Source: Ambulatory Visit | Attending: Obstetrics and Gynecology | Admitting: Obstetrics and Gynecology

## 2018-02-05 VITALS — BP 121/67 | HR 94 | Wt 174.0 lb

## 2018-02-05 DIAGNOSIS — O26893 Other specified pregnancy related conditions, third trimester: Secondary | ICD-10-CM | POA: Diagnosis present

## 2018-02-05 DIAGNOSIS — O479 False labor, unspecified: Secondary | ICD-10-CM

## 2018-02-05 DIAGNOSIS — N898 Other specified noninflammatory disorders of vagina: Secondary | ICD-10-CM | POA: Diagnosis present

## 2018-02-05 DIAGNOSIS — O99333 Smoking (tobacco) complicating pregnancy, third trimester: Secondary | ICD-10-CM | POA: Diagnosis not present

## 2018-02-05 DIAGNOSIS — F17210 Nicotine dependence, cigarettes, uncomplicated: Secondary | ICD-10-CM | POA: Insufficient documentation

## 2018-02-05 DIAGNOSIS — O4703 False labor before 37 completed weeks of gestation, third trimester: Secondary | ICD-10-CM | POA: Insufficient documentation

## 2018-02-05 DIAGNOSIS — R8789 Other abnormal findings in specimens from female genital organs: Secondary | ICD-10-CM | POA: Diagnosis not present

## 2018-02-05 DIAGNOSIS — Z3A28 28 weeks gestation of pregnancy: Secondary | ICD-10-CM | POA: Diagnosis not present

## 2018-02-05 DIAGNOSIS — O09899 Supervision of other high risk pregnancies, unspecified trimester: Secondary | ICD-10-CM

## 2018-02-05 HISTORY — DX: Headache: R51

## 2018-02-05 HISTORY — DX: Headache, unspecified: R51.9

## 2018-02-05 LAB — URINALYSIS, ROUTINE W REFLEX MICROSCOPIC
Bilirubin Urine: NEGATIVE
Glucose, UA: NEGATIVE mg/dL
Ketones, ur: NEGATIVE mg/dL
Leukocytes, UA: NEGATIVE
Nitrite: NEGATIVE
Protein, ur: NEGATIVE mg/dL
Specific Gravity, Urine: 1.015 (ref 1.005–1.030)
pH: 7 (ref 5.0–8.0)

## 2018-02-05 LAB — CBC
Hematocrit: 33.1 % — ABNORMAL LOW (ref 34.0–46.6)
Hemoglobin: 11.1 g/dL (ref 11.1–15.9)
MCH: 33 pg (ref 26.6–33.0)
MCHC: 33.5 g/dL (ref 31.5–35.7)
MCV: 99 fL — ABNORMAL HIGH (ref 79–97)
Platelets: 351 10*3/uL (ref 150–379)
RBC: 3.36 x10E6/uL — ABNORMAL LOW (ref 3.77–5.28)
RDW: 12.8 % (ref 12.3–15.4)
WBC: 19 10*3/uL — ABNORMAL HIGH (ref 3.4–10.8)

## 2018-02-05 LAB — CERVICOVAGINAL ANCILLARY ONLY
Bacterial vaginitis: NEGATIVE
Candida vaginitis: NEGATIVE
Chlamydia: NEGATIVE
Neisseria Gonorrhea: NEGATIVE
Trichomonas: NEGATIVE

## 2018-02-05 LAB — GLUCOSE TOLERANCE, 2 HOURS W/ 1HR
Glucose, 1 hour: 78 mg/dL (ref 65–179)
Glucose, 2 hour: 64 mg/dL — ABNORMAL LOW (ref 65–152)
Glucose, Fasting: 74 mg/dL (ref 65–91)

## 2018-02-05 LAB — POCT FERN TEST: POCT Fern Test: NEGATIVE

## 2018-02-05 LAB — RPR: RPR Ser Ql: NONREACTIVE

## 2018-02-05 LAB — HIV ANTIBODY (ROUTINE TESTING W REFLEX): HIV Screen 4th Generation wRfx: NONREACTIVE

## 2018-02-05 MED ORDER — BETAMETHASONE SOD PHOS & ACET 6 (3-3) MG/ML IJ SUSP
12.0000 mg | Freq: Once | INTRAMUSCULAR | Status: AC
  Administered 2018-02-05: 12 mg via INTRAMUSCULAR

## 2018-02-05 NOTE — Progress Notes (Signed)
Pt is in office today for Betamthasone injection for +FFN. Pt tolerated injection well.  Pt made aware to have follow up injection at least 24 hours after today's injection. Pt states understanding.  BP 121/67   Pulse 94   Wt 174 lb (78.9 kg)   LMP 07/24/2017 (Exact Date)   BMI 30.82 kg/m   Administrations This Visit    betamethasone acetate-betamethasone sodium phosphate (CELESTONE) injection 12 mg    Admin Date 02/05/2018 Action Given Dose 12 mg Route Intramuscular Administered By Lanney Gins, CMA

## 2018-02-05 NOTE — MAU Note (Signed)
Was seen yesterday at [facility] and was having some abd pain. Was told cervix closed but soft and leaking amniotic fld. Got first BMZ today at office and was to come MAU tomorrow for second. LEaking more fld today and is clear. Having more ctxs today

## 2018-02-06 ENCOUNTER — Inpatient Hospital Stay (HOSPITAL_COMMUNITY)
Admission: AD | Admit: 2018-02-06 | Discharge: 2018-02-06 | Disposition: A | Discharge status: Home / Self Care | Payer: Medicaid Other | Source: Ambulatory Visit | Attending: Family Medicine | Admitting: Family Medicine

## 2018-02-06 DIAGNOSIS — O479 False labor, unspecified: Secondary | ICD-10-CM | POA: Diagnosis not present

## 2018-02-06 DIAGNOSIS — R8789 Other abnormal findings in specimens from female genital organs: Secondary | ICD-10-CM

## 2018-02-06 DIAGNOSIS — O09899 Supervision of other high risk pregnancies, unspecified trimester: Secondary | ICD-10-CM

## 2018-02-06 MED ORDER — BETAMETHASONE SOD PHOS & ACET 6 (3-3) MG/ML IJ SUSP
12.0000 mg | Freq: Once | INTRAMUSCULAR | Status: AC
  Administered 2018-02-06: 12 mg via INTRAMUSCULAR
  Filled 2018-02-06: qty 2

## 2018-02-06 NOTE — MAU Provider Note (Signed)
Chief Complaint:  Vaginal Discharge   None     HPI: Brooke Carrillo is a 32 y.o. [id number] at [redacted]w[redacted]d who presents to MAU reporting contractions and possible leaking of fluid. Patient states she was called today about having leaking of fluid. Patient is concerned as to why she wasn't admitted. States she does feel leaking and fluid appears clear. She denies soaking any underwear or pads. Having stronger contractions that are about 20 minutes apart earlier in the day. They have now kind of spaced out and are milder. Received BMZ in office yesterday. Denies vaginal bleeding. Good fetal movement.   Pregnancy Course:  OB care at [facility]   Past Medical History: Past Medical History:  Diagnosis Date  . Headache   . Medical history non-contributory   . No pertinent past medical history   . Pregnancy as incidental finding     Past obstetric history: OB History  Gravida Para Term Preterm AB Living  0 4 3  SAB TAB Ectopic Multiple Live Births  3 1 0 0 3    # Outcome Date GA Lbr Len/2nd Weight Sex Delivery Anes PTL Lv  8 Current           7 SAB 05/2017          6 Term 01/03/15 [redacted]w[redacted]d 01:53 / 00:11 3.49 kg (7 lb 11.1 oz) F Vag-Spont EPI  LIV     Birth Comments: None  5 Term 02/24/12 [redacted]w[redacted]d 410:00 / 00:16 3.57 kg (7 lb 13.9 oz) F Vag-Spont EPI  LIV  4 SAB 2011          3 SAB 2009          2 Term 11/10/06 [redacted]w[redacted]d  3.345 kg (7 lb 6 oz) F Vag-Spont EPI N LIV  1 TAB 10/2004            Past Surgical History: Past Surgical History:  Procedure Laterality Date  . ORIF FINGER / THUMB FRACTURE    . WISDOM TOOTH EXTRACTION       Family History: Family History  Problem Relation Age of Onset  . Hypertension Father   . Stroke Father   . Heart disease Father   . Dementia Mother   . Cancer Maternal Aunt        Breast  . HIV Maternal Aunt   . Stroke Maternal Uncle   . Cancer Paternal Uncle        Colon  . Cancer Maternal Grandmother   . Hypertension Paternal Grandmother   . Cancer Paternal  Grandfather        Colon    Social History: Social History   Tobacco Use  . Smoking status: Current Every Day Smoker    Packs/day: 0.25    Types: Cigarettes  . Smokeless tobacco: Never Used  Substance Use Topics  . Alcohol use: No    Alcohol/week: 0.0 oz  . Drug use: No    Allergies: No Known Allergies  Meds:  Medications Prior to Admission  Medication Sig Dispense Refill Last Dose  . omeprazole (PRILOSEC) 20 MG capsule Take 1 capsule (20 mg total) by mouth 2 (two) times daily before a meal. 60 capsule 5 02/04/2018 at Unknown time  . Prenat-FeCbn-FeAspGl-FA-Omega (OB COMPLETE PETITE) 35-5-1-200 MG CAPS Take 1 tablet by mouth daily. 30 capsule 12 02/05/2018 at Unknown time  . Vitamin D, Ergocalciferol, (DRISDOL) 50000 units CAPS capsule Take 1 capsule (50,000 Units total) by mouth every 7 (seven) days. 30 capsule  2 Past Month at Unknown time    I have reviewed patient's Past Medical Hx, Surgical Hx, Family Hx, Social Hx, medications and allergies.   ROS:  All systems reviewed and are negative for acute change except as noted in the HPI.   Physical Exam   Patient Vitals for the past 24 hrs:  BP Temp Pulse Resp Height Weight  02/05/18 2259 (!) 125/58 98.3 F (36.8 C) 91 18  (1.6 m) 79.8 kg (176 lb)   Constitutional: Well-developed, well-nourished female in no acute distress.  Cardiovascular: normal rate and rhythm, pulses intact Respiratory: normal rate and effort.  GI: Abd soft, non-tender, gravid appropriate for gestational age.  MS: Extremities nontender, no edema, normal ROM Neurologic: Alert and oriented x 4.  Pelvic: declined exam (had closed cervix yesterday) Psych: normal mood and affect Skin: warm and dry    Labs: Results for orders placed or performed during the hospital encounter of 02/05/18 (from the past 24 hour(s))  Urinalysis, Routine w reflex microscopic     Status: Abnormal   Collection Time: 02/05/18 11:05 PM  Result Value Ref Range   Color,  Urine YELLOW YELLOW   APPearance CLOUDY (A) CLEAR   Specific Gravity, Urine 1.015 1.005 - 1.030   pH 7.0 5.0 - 8.0   Glucose, UA NEGATIVE NEGATIVE mg/dL   Hgb urine dipstick SMALL (A) NEGATIVE   Bilirubin Urine NEGATIVE NEGATIVE   Ketones, ur NEGATIVE NEGATIVE mg/dL   Protein, ur NEGATIVE NEGATIVE mg/dL   Nitrite NEGATIVE NEGATIVE   Leukocytes, UA NEGATIVE NEGATIVE   RBC / HPF 21-50 0 - 5 RBC/hpf   WBC, UA 6-10 0 - 5 WBC/hpf   Bacteria, UA RARE (A) NONE SEEN   Squamous Epithelial / LPF 0-5 0 - 5   Mucus PRESENT    Amorphous Crystal PRESENT   Fern Test     Status: None   Collection Time: 02/05/18 11:41 PM  Result Value Ref Range   POCT Fern Test Negative = intact amniotic membranes     Imaging:  No results found.  MAU Management/MDM: Vitals and nursing notes reviewed Orders Placed This Encounter  Procedures  . Urinalysis, Routine w reflex microscopic  . Fern Test    No orders of the defined types were placed in this encounter.  Upon further investigation and talking with patient. She was told she had a positive FFN test and not rupture (misunderstood terminology). Fern in MAU negative. Reviewed cultures from clinic yesterday which were negative for any infections vaginal discharge. Discharge likely normal physiologic discharge of pregnancy. Patient reassured.  Plan of care reviewed with patient, including labs and tests ordered and medical treatment.   I personally reviewed the patient's NST today, found to be REACTIVE. 150 bpm, mod var, +accels, no decels. CTX: rare  ASSESSMENT 1. False labor   2. Threatened premature labor in third trimester   3. Vaginal discharge     PLAN Discharge home in stable condition. Return to MAU tomorrow for second dose of BMZ Follow-up with OB provider Preterm labor precautions givens Counseled on return precautions Handout given   Allergies as of 02/06/2018   No Known Allergies     Medication List    TAKE these medications    OB COMPLETE PETITE 35-5-1-200 MG Caps Take 1 tablet by mouth daily.   omeprazole 20 MG capsule Commonly known as:  PRILOSEC Take 1 capsule (20 mg total) by mouth 2 (two) times daily before a meal.   Vitamin D (Ergocalciferol)  50000 units Caps capsule Commonly known as:  DRISDOL Take 1 capsule (50,000 Units total) by mouth every 7 (seven) days.       Caryl Ada, DO OB Fellow [facility], [facility]  02/06/2018, 12:02 AM

## 2018-02-06 NOTE — Discharge Instructions (Signed)

## 2018-02-06 NOTE — MAU Note (Signed)
Patient here for 2nd BMZ. Denies any complaints at this time 

## 2018-02-08 ENCOUNTER — Other Ambulatory Visit: Payer: Self-pay | Admitting: Certified Nurse Midwife

## 2018-02-08 DIAGNOSIS — Z3480 Encounter for supervision of other normal pregnancy, unspecified trimester: Secondary | ICD-10-CM

## 2018-02-08 NOTE — Progress Notes (Signed)
I have reviewed the chart and agree with nursing staff's documentation of this patient's encounter.  Lanore Renderos A Fredick Schlosser, CNM 02/08/2018 9:02 AM    

## 2018-02-18 ENCOUNTER — Ambulatory Visit (HOSPITAL_COMMUNITY)
Admission: RE | Admit: 2018-02-18 | Discharge: 2018-02-18 | Disposition: A | Discharge status: Home / Self Care | Payer: Medicaid Other | Source: Ambulatory Visit | Attending: Certified Nurse Midwife | Admitting: Certified Nurse Midwife

## 2018-02-18 ENCOUNTER — Ambulatory Visit (INDEPENDENT_AMBULATORY_CARE_PROVIDER_SITE_OTHER): Payer: Medicaid Other | Admitting: Certified Nurse Midwife

## 2018-02-18 ENCOUNTER — Inpatient Hospital Stay (HOSPITAL_COMMUNITY)
Admission: AD | Admit: 2018-02-18 | Discharge: 2018-02-18 | Disposition: A | Discharge status: Home / Self Care | Payer: Medicaid Other | Source: Ambulatory Visit | Attending: Obstetrics & Gynecology | Admitting: Obstetrics & Gynecology

## 2018-02-18 ENCOUNTER — Encounter (HOSPITAL_COMMUNITY): Payer: Self-pay

## 2018-02-18 ENCOUNTER — Encounter: Payer: Self-pay | Admitting: Certified Nurse Midwife

## 2018-02-18 VITALS — BP 126/76 | HR 93 | Wt 177.4 lb

## 2018-02-18 DIAGNOSIS — O289 Unspecified abnormal findings on antenatal screening of mother: Secondary | ICD-10-CM | POA: Diagnosis not present

## 2018-02-18 DIAGNOSIS — O99333 Smoking (tobacco) complicating pregnancy, third trimester: Secondary | ICD-10-CM | POA: Diagnosis not present

## 2018-02-18 DIAGNOSIS — Z3480 Encounter for supervision of other normal pregnancy, unspecified trimester: Secondary | ICD-10-CM

## 2018-02-18 DIAGNOSIS — O368320 Maternal care for abnormalities of the fetal heart rate or rhythm, second trimester, not applicable or unspecified: Secondary | ICD-10-CM

## 2018-02-18 DIAGNOSIS — O288 Other abnormal findings on antenatal screening of mother: Secondary | ICD-10-CM

## 2018-02-18 DIAGNOSIS — O368130 Decreased fetal movements, third trimester, not applicable or unspecified: Secondary | ICD-10-CM | POA: Insufficient documentation

## 2018-02-18 DIAGNOSIS — Z3A29 29 weeks gestation of pregnancy: Secondary | ICD-10-CM | POA: Diagnosis not present

## 2018-02-18 DIAGNOSIS — Z3483 Encounter for supervision of other normal pregnancy, third trimester: Secondary | ICD-10-CM

## 2018-02-18 DIAGNOSIS — F17210 Nicotine dependence, cigarettes, uncomplicated: Secondary | ICD-10-CM | POA: Diagnosis not present

## 2018-02-18 DIAGNOSIS — O368390 Maternal care for abnormalities of the fetal heart rate or rhythm, unspecified trimester, not applicable or unspecified: Secondary | ICD-10-CM

## 2018-02-18 NOTE — MAU Note (Addendum)
Pt sent from ultrasound for prolonged monitoring. BPP done for DFM.

## 2018-02-18 NOTE — MAU Provider Note (Signed)
History     CSN: [id number]  Arrival date and time: 02/18/18 1716  Chief Complaint  Patient presents with  . Non-stress Test   [id number] .6 wks sent from MFM for BPP 6/8 (off for breathing). She was seen in the office today with c/o decreased FM and found to have variables on NST. She reports +FM now. No LOF, VB, or ctx. Her pregnancy has been uncomplicated.   OB History    Gravida  8   Para  3   Term  3   Preterm  0   AB  4   Living  3     SAB  3   TAB  1   Ectopic  0   Multiple  0   Live Births  3           Past Medical History:  Diagnosis Date  . Headache   . Medical history non-contributory   . No pertinent past medical history   . Pregnancy as incidental finding     Past Surgical History:  Procedure Laterality Date  . ORIF FINGER / THUMB FRACTURE    . WISDOM TOOTH EXTRACTION      Family History  Problem Relation Age of Onset  . Hypertension Father   . Stroke Father   . Heart disease Father   . Dementia Mother   . Cancer Maternal Aunt        Breast  . HIV Maternal Aunt   . Stroke Maternal Uncle   . Cancer Paternal Uncle        Colon  . Cancer Maternal Grandmother   . Hypertension Paternal Grandmother   . Cancer Paternal Grandfather        Colon    Social History   Tobacco Use  . Smoking status: Current Every Day Smoker    Packs/day: 0.25    Types: Cigarettes  . Smokeless tobacco: Never Used  Substance Use Topics  . Alcohol use: No    Alcohol/week: 0.0 oz  . Drug use: No    Allergies: No Known Allergies  Medications Prior to Admission  Medication Sig Dispense Refill Last Dose  . omeprazole (PRILOSEC) 20 MG capsule Take 1 capsule (20 mg total) by mouth 2 (two) times daily before a meal. (Patient taking differently: Take 20 mg by mouth 2 (two) times daily as needed (heartburn). ) 60 capsule 5 Past Week at Unknown time  . Prenat-FeCbn-FeAspGl-FA-Omega (OB COMPLETE PETITE) 35-5-1-200 MG CAPS Take 1 tablet by mouth daily. 30  capsule 12 02/17/2018 at Unknown time  . Vitamin D, Ergocalciferol, (DRISDOL) 50000 units CAPS capsule Take 1 capsule (50,000 Units total) by mouth every 7 (seven) days. (Patient taking differently: Take 50,000 Units by mouth every 7 (seven) days. On Wednesdays) 30 capsule 2 Past Week at Unknown time    Review of Systems  Gastrointestinal: Negative for abdominal pain.  Genitourinary: Negative for vaginal bleeding and vaginal discharge.   Physical Exam   Blood pressure (!) 124/58, pulse 75, resp. rate 16, last menstrual period 07/24/2017.  Physical Exam  Constitutional: She is oriented to person, place, and time. She appears well-developed and well-nourished. No distress.  HENT:  Head: Normocephalic and atraumatic.  Neck: Normal range of motion.  Cardiovascular: Normal rate.  Respiratory: Effort normal. No respiratory distress.  GI: Soft. She exhibits no distension. There is no tenderness.  Musculoskeletal: Normal range of motion.  Neurological: She is alert and oriented to person, place, and time.  Skin: Skin is warm and  dry.  Psychiatric: She has a normal mood and affect.  EFM: 145 bpm, mod variability, + accels, no decels Toco: none  No results found for this or any previous visit (from the past 24 hour(s)).  MAU Course  Procedures  MDM NST reactive then had a variable decel, pt placed left lateral, will continue to monitor. Fetal tracing reassuring during prolonged monitoring, no further variables. Stable for discharge home.  Assessment and Plan   1. [redacted] weeks gestation of pregnancy   2. Antepartum variable deceleration    Discharge home Follow up in OB office as scheduled PTL precautions FMCs  Allergies as of 02/18/2018   No Known Allergies     Medication List    TAKE these medications   OB COMPLETE PETITE 35-5-1-200 MG Caps Take 1 tablet by mouth daily.   omeprazole 20 MG capsule Commonly known as:  PRILOSEC Take 1 capsule (20 mg total) by mouth 2 (two)  times daily before a meal. What changed:    when to take this  reasons to take this   Vitamin D (Ergocalciferol) 50000 units Caps capsule Commonly known as:  DRISDOL Take 1 capsule (50,000 Units total) by mouth every 7 (seven) days. What changed:  additional instructions      Donette Larry, CNM 02/18/2018, 7:24 PM

## 2018-02-18 NOTE — Progress Notes (Signed)
   PRENATAL VISIT NOTE  Subjective:  Brooke Carrillo is a 32 y.o. [phone] at [redacted]w[redacted]d being seen today for ongoing prenatal care.  She is currently monitored for the following issues for this low-risk pregnancy and has Supervision of other normal pregnancy, antepartum; Tobacco abuse; Rh negative state in antepartum period; Vitamin D deficiency; and Gastroesophageal reflux during pregnancy, antepartum, second trimester on their problem list.  Patient reports no bleeding, no leaking and reports decreased fetal movement; fetus is moving 4 times a day per patient report.  Contractions: Not present. Vag. Bleeding: None.  Movement: (!) Decreased. Denies leaking of fluid.   The following portions of the patient's history were reviewed and updated as appropriate: allergies, current medications, past family history, past medical history, past social history, past surgical history and problem list. Problem list updated.  Objective:   Vitals:   02/18/18 1512  BP: 126/76  Pulse: 93  Weight: 177 lb 6.4 oz (80.5 kg)    Fetal Status: Fetal Heart Rate (bpm): NST; non-reactive Fundal Height: 30 cm Movement: (!) Decreased     General:  Alert, oriented and cooperative. Patient is in no acute distress.  Skin: Skin is warm and dry. No rash noted.   Cardiovascular: Normal heart rate noted  Respiratory: Normal respiratory effort, no problems with respiration noted  Abdomen: Soft, gravid, appropriate for gestational age.  Pain/Pressure: Present     Pelvic: Cervical exam deferred        Extremities: Normal range of motion.  Edema: None  Mental Status: Normal mood and affect. Normal behavior. Normal judgment and thought content.  NST: no accels, + multiple variable decels, moderate variability, Cat. 2 tracing. No contractions on toco.   Assessment and Plan:  Pregnancy: [id number] at [redacted]w[redacted]d  1. Supervision of other normal pregnancy, antepartum     Non-reactive NST, NST with variables to low 90's repetitively.  Water  given to patient, increased PO water intake encouraged.   - Fetal nonstress test; Future  2. Non-reactive NST (non-stress test)      - [place] MFM FETAL BPP WO NON STRESS; Future  3. Decreased fetal movement affecting management of pregnancy in third trimester, single or unspecified fetus      - [place] MFM FETAL BPP WO NON STRESS; Future  Preterm labor symptoms and general obstetric precautions including but not limited to vaginal bleeding, contractions, leaking of fluid and fetal movement were reviewed in detail with the patient. Please refer to After Visit Summary for other counseling recommendations.  Return in about 2 weeks (around 03/04/2018) for ROB.  Future Appointments  Date Time Provider Department Center  02/18/2018  4:15 PM [facility] [place] 1 [facility]  03/04/2018 10:30 AM Janett Kamath, Rodell Perna, CNM [facility] None    Roe Coombs, CNM

## 2018-02-18 NOTE — Discharge Instructions (Signed)
Braxton Hicks Contractions °Contractions of the uterus can occur throughout pregnancy, but they are not always a sign that you are in labor. You may have practice contractions called Braxton Hicks contractions. These false labor contractions are sometimes confused with true labor. °What are Braxton Hicks contractions? °Braxton Hicks contractions are tightening movements that occur in the muscles of the uterus before labor. Unlike true labor contractions, these contractions do not result in opening (dilation) and thinning of the cervix. Toward the end of pregnancy (32-34 weeks), Braxton Hicks contractions can happen more often and may become stronger. These contractions are sometimes difficult to tell apart from true labor because they can be very uncomfortable. You should not feel embarrassed if you go to the hospital with false labor. °Sometimes, the only way to tell if you are in true labor is for your health care provider to look for changes in the cervix. The health care provider will do a physical exam and may monitor your contractions. If you are not in true labor, the exam should show that your cervix is not dilating and your water has not broken. °If there are other health problems associated with your pregnancy, it is completely safe for you to be sent home with false labor. You may continue to have Braxton Hicks contractions until you go into true labor. °How to tell the difference between true labor and false labor °True labor °· Contractions last 30-70 seconds. °· Contractions become very regular. °· Discomfort is usually felt in the top of the uterus, and it spreads to the lower abdomen and low back. °· Contractions do not go away with walking. °· Contractions usually become more intense and increase in frequency. °· The cervix dilates and gets thinner. °False labor °· Contractions are usually shorter and not as strong as true labor contractions. °· Contractions are usually irregular. °· Contractions  are often felt in the front of the lower abdomen and in the groin. °· Contractions may go away when you walk around or change positions while lying down. °· Contractions get weaker and are shorter-lasting as time goes on. °· The cervix usually does not dilate or become thin. °Follow these instructions at home: °· Take over-the-counter and prescription medicines only as told by your health care provider. °· Keep up with your usual exercises and follow other instructions from your health care provider. °· Eat and drink lightly if you think you are going into labor. °· If Braxton Hicks contractions are making you uncomfortable: °? Change your position from lying down or resting to walking, or change from walking to resting. °? Sit and rest in a tub of warm water. °? Drink enough fluid to keep your urine pale yellow. Dehydration may cause these contractions. °? Do slow and deep breathing several times an hour. °· Keep all follow-up prenatal visits as told by your health care provider. This is important. °Contact a health care provider if: °· You have a fever. °· You have continuous pain in your abdomen. °Get help right away if: °· Your contractions become stronger, more regular, and closer together. °· You have fluid leaking or gushing from your vagina. °· You pass blood-tinged mucus (bloody show). °· You have bleeding from your vagina. °· You have low back pain that you never had before. °· You feel your baby’s head pushing down and causing pelvic pressure. °· Your baby is not moving inside you as much as it used to. °Summary °· Contractions that occur before labor are called Braxton   Hicks contractions, false labor, or practice contractions. °· Braxton Hicks contractions are usually shorter, weaker, farther apart, and less regular than true labor contractions. True labor contractions usually become progressively stronger and regular and they become more frequent. °· Manage discomfort from Braxton Hicks contractions by  changing position, resting in a warm bath, drinking plenty of water, or practicing deep breathing. °This information is not intended to replace advice given to you by your health care provider. Make sure you discuss any questions you have with your health care provider. °Document Released: 01/29/2017 Document Revised: 01/29/2017 Document Reviewed: 01/29/2017 °Elsevier Interactive Patient Education © 2018 [employer] ° °Fetal Movement Counts °Patient Name: ________________________________________________ Patient Due Date: ____________________ °What is a fetal movement count? °A fetal movement count is the number of times that you feel your baby move during a certain amount of time. This may also be called a fetal kick count. A fetal movement count is recommended for every pregnant woman. You may be asked to start counting fetal movements as early as week 28 of your pregnancy. °Pay attention to when your baby is most active. You may notice your baby's sleep and wake cycles. You may also notice things that make your baby move more. You should do a fetal movement count: °· When your baby is normally most active. °· At the same time each day. ° °A good time to count movements is while you are resting, after having something to eat and drink. °How do I count fetal movements? °1. Find a quiet, comfortable area. Sit, or lie down on your side. °2. Write down the date, the start time and stop time, and the number of movements that you felt between those two times. Take this information with you to your health care visits. °3. For 2 hours, count kicks, flutters, swishes, rolls, and jabs. You should feel at least 10 movements during 2 hours. °4. You may stop counting after you have felt 10 movements. °5. If you do not feel 10 movements in 2 hours, have something to eat and drink. Then, keep resting and counting for 1 hour. If you feel at least 4 movements during that hour, you may stop counting. °Contact a health care  provider if: °· You feel fewer than 4 movements in 2 hours. °· Your baby is not moving like he or she usually does. °Date: ____________ Start time: ____________ Stop time: ____________ Movements: ____________ °Date: ____________ Start time: ____________ Stop time: ____________ Movements: ____________ °Date: ____________ Start time: ____________ Stop time: ____________ Movements: ____________ °Date: ____________ Start time: ____________ Stop time: ____________ Movements: ____________ °Date: ____________ Start time: ____________ Stop time: ____________ Movements: ____________ °Date: ____________ Start time: ____________ Stop time: ____________ Movements: ____________ °Date: ____________ Start time: ____________ Stop time: ____________ Movements: ____________ °Date: ____________ Start time: ____________ Stop time: ____________ Movements: ____________ °Date: ____________ Start time: ____________ Stop time: ____________ Movements: ____________ °This information is not intended to replace advice given to you by your health care provider. Make sure you discuss any questions you have with your health care provider. °Document Released: 10/15/2006 Document Revised: 05/14/2016 Document Reviewed: 10/25/2015 °Elsevier Interactive Patient Education © 2018 [employer] ° °

## 2018-02-18 NOTE — Progress Notes (Signed)
Patient reports fetal movement and occasional dull back pain, denies contractions.

## 2018-03-04 ENCOUNTER — Ambulatory Visit (INDEPENDENT_AMBULATORY_CARE_PROVIDER_SITE_OTHER): Payer: Medicaid Other | Admitting: Certified Nurse Midwife

## 2018-03-04 ENCOUNTER — Encounter: Payer: Self-pay | Admitting: Certified Nurse Midwife

## 2018-03-04 VITALS — BP 113/68 | HR 89

## 2018-03-04 DIAGNOSIS — Z3480 Encounter for supervision of other normal pregnancy, unspecified trimester: Secondary | ICD-10-CM

## 2018-03-04 DIAGNOSIS — E559 Vitamin D deficiency, unspecified: Secondary | ICD-10-CM

## 2018-03-04 NOTE — Progress Notes (Signed)
ROB. C/o pressure. 

## 2018-03-04 NOTE — Patient Instructions (Signed)
Preterm Labor and Birth Information Pregnancy normally lasts 39-41 weeks. Preterm labor is when labor starts early. It starts before you have been pregnant for 37 whole weeks. What are the risk factors for preterm labor? Preterm labor is more likely to occur in women who:  Have an infection while pregnant.  Have a cervix that is short.  Have gone into preterm labor before.  Have had surgery on their cervix.  Are younger than age 32.  Are older than age 6.  Are African American.  Are pregnant with two or more babies.  Take street drugs while pregnant.  Smoke while pregnant.  Do not gain enough weight while pregnant.  Got pregnant right after another pregnancy.  What are the symptoms of preterm labor? Symptoms of preterm labor include:  Cramps. The cramps may feel like the cramps some women get during their period. The cramps may happen with watery poop (diarrhea).  Pain in the belly (abdomen).  Pain in the lower back.  Regular contractions or tightening. It may feel like your belly is getting tighter.  Pressure in the lower belly that seems to get stronger.  More fluid (discharge) leaking from the vagina. The fluid may be watery or bloody.  Water breaking.  Why is it important to notice signs of preterm labor? Babies who are born early may not be fully developed. They have a higher chance for:  Long-term heart problems.  Long-term lung problems.  Trouble controlling body systems, like breathing.  Bleeding in the brain.  A condition called cerebral palsy.  Learning difficulties.  Death.  These risks are highest for babies who are born before 34 weeks of pregnancy. How is preterm labor treated? Treatment depends on:  How long you were pregnant.  Your condition.  The health of your baby.  Treatment may involve:  Having a stitch (suture) placed in your cervix. When you give birth, your cervix opens so the baby can come out. The stitch keeps the  cervix from opening too soon.  Staying at the hospital.  Taking or getting medicines, such as: ? Hormone medicines. ? Medicines to stop contractions. ? Medicines to help the baby's lungs develop. ? Medicines to prevent your baby from having cerebral palsy.  What should I do if I am in preterm labor? If you think you are going into labor too soon, call your doctor right away. How can I prevent preterm labor?  Do not use any tobacco products. ? Examples of these are cigarettes, chewing tobacco, and e-cigarettes. ? If you need help quitting, ask your doctor.  Do not use street drugs.  Do not use any medicines unless you ask your doctor if they are safe for you.  Talk with your doctor before taking any herbal supplements.  Make sure you gain enough weight.  Watch for infection. If you think you might have an infection, get it checked right away.  If you have gone into preterm labor before, tell your doctor. This information is not intended to replace advice given to you by your health care provider. Make sure you discuss any questions you have with your health care provider. Document Released: 12/12/2008 Document Revised: 02/26/2016 Document Reviewed: 02/06/2016 Elsevier Interactive Patient Education  2018 [employer].  Preventing Preterm Birth Preterm birth is when your baby is delivered between 20 weeks and 37 weeks of pregnancy. A full-term pregnancy lasts for at least 37 weeks. Preterm birth can be dangerous for your baby because the last few weeks of pregnancy  are an important time for your baby's brain and lungs to grow. Many things can cause a baby to be born early. Sometimes the cause is not known. There are certain factors that make you more likely to experience preterm birth, such as:  Having a previous baby born preterm.  Being pregnant with twins or other multiples.  Having had fertility treatment.  Being overweight or underweight at the start of your  pregnancy.  Having any of the following during pregnancy: ? An infection, including a urinary tract infection (UTI) or an STI (sexually transmitted infection). ? High blood pressure. ? Diabetes. ? Vaginal bleeding.  Being age 7 or older.  Being age 1 or younger.  Getting pregnant within 6 months of a previous pregnancy.  Suffering extreme stress or physical or emotional abuse during pregnancy.  Standing for long periods of time during pregnancy, such as working at a job that requires standing.  What are the risks? The most serious risk of preterm birth is that the baby may not survive. This is more likely to happen if a baby is born before 34 weeks. Other risks and complications of preterm birth may include your baby having:  Breathing problems.  Brain damage that affects movement and coordination (cerebral palsy).  Feeding difficulties.  Vision or hearing problems.  Infections or inflammation of the digestive tract (colitis).  Developmental delays.  Learning disabilities.  Higher risk for diabetes, heart disease, and high blood pressure later in life.  What can I do to lower my risk? Medical care  The most important thing you can do to lower your risk for preterm birth is to get routine medical care during pregnancy (prenatal care). If you have a high risk of preterm birth, you may be referred to a health care provider who specializes in managing high-risk pregnancies (perinatologist). You may be given medicine to help prevent preterm birth. Lifestyle changes Certain lifestyle changes can also lower your risk of preterm birth:  Wait at least 6 months after a pregnancy to become pregnant again.  Try to plan pregnancy for when you are between 82 and 84 years old.  Get to a healthy weight before getting pregnant. If you are overweight, work with your health care provider to safely lose weight.  Do not use any products that contain nicotine or tobacco, such as  cigarettes and e-cigarettes. If you need help quitting, ask your health care provider.  Do not drink alcohol.  Do not use drugs.  Where to find support: For more support, consider:  Talking with your health care provider.  Talking with a therapist or substance abuse counselor, if you need help quitting.  Working with a diet and nutrition specialist (dietitian) or a Systems analyst to maintain a healthy weight.  Joining a support group.  Where to find more information: Learn more about preventing preterm birth from:  Centers for Disease Control and Prevention: http://curry.org/  March of Dimes: marchofdimes.org/complications/premature-babies.aspx  American Pregnancy Association: americanpregnancy.org/labor-and-birth/premature-labor  Contact a health care provider if:  You have any of the following signs of preterm labor before 37 weeks: ? A change or increase in vaginal discharge. ? Fluid leaking from your vagina. ? Pressure or cramps in your lower abdomen. ? A backache that does not go away or gets worse. ? Regular tightening (contractions) in your lower abdomen. Summary  Preterm birth means having your baby during weeks 20-37 of pregnancy.  Preterm birth may put your baby at risk for physical and mental problems.  Getting  good prenatal care can help prevent preterm birth.  You can lower your risk of preterm birth by making certain lifestyle changes, such as not smoking and not using alcohol. This information is not intended to replace advice given to you by your health care provider. Make sure you discuss any questions you have with your health care provider. Document Released: 10/30/2015 Document Revised: 05/24/2016 Document Reviewed: 05/24/2016 Elsevier Interactive Patient Education  2018 [employer]  Third Trimester of Pregnancy The third trimester is from week 29 through week 42, months 7 through 9. This trimester  is when your unborn baby (fetus) is growing very fast. At the end of the ninth month, the unborn baby is about 20 inches in length. It weighs about 6-10 pounds. Follow these instructions at home:  Avoid all smoking, herbs, and alcohol. Avoid drugs not approved by your doctor.  Do not use any tobacco products, including cigarettes, chewing tobacco, and electronic cigarettes. If you need help quitting, ask your doctor. You may get counseling or other support to help you quit.  Only take medicine as told by your doctor. Some medicines are safe and some are not during pregnancy.  Exercise only as told by your doctor. Stop exercising if you start having cramps.  Eat regular, healthy meals.  Wear a good support bra if your breasts are tender.  Do not use hot tubs, steam rooms, or saunas.  Wear your seat belt when driving.  Avoid raw meat, uncooked cheese, and liter boxes and soil used by cats.  Take your prenatal vitamins.  Take 1500-2000 milligrams of calcium daily starting at the 20th week of pregnancy until you deliver your baby.  Try taking medicine that helps you poop (stool softener) as needed, and if your doctor approves. Eat more fiber by eating fresh fruit, vegetables, and whole grains. Drink enough fluids to keep your pee (urine) clear or pale yellow.  Take warm water baths (sitz baths) to soothe pain or discomfort caused by hemorrhoids. Use hemorrhoid cream if your doctor approves.  If you have puffy, bulging veins (varicose veins), wear support hose. Raise (elevate) your feet for 15 minutes, 3-4 times a day. Limit salt in your diet.  Avoid heavy lifting, wear low heels, and sit up straight.  Rest with your legs raised if you have leg cramps or low back pain.  Visit your dentist if you have not gone during your pregnancy. Use a soft toothbrush to brush your teeth. Be gentle when you floss.  You can have sex (intercourse) unless your doctor tells you not to.  Do not travel  far distances unless you must. Only do so with your doctor's approval.  Take prenatal classes.  Practice driving to the hospital.  Pack your hospital bag.  Prepare the baby's room.  Go to your doctor visits. Get help if:  You are not sure if you are in labor or if your water has broken.  You are dizzy.  You have mild cramps or pressure in your lower belly (abdominal).  You have a nagging pain in your belly area.  You continue to feel sick to your stomach (nauseous), throw up (vomit), or have watery poop (diarrhea).  You have bad smelling fluid coming from your vagina.  You have pain with peeing (urination). Get help right away if:  You have a fever.  You are leaking fluid from your vagina.  You are spotting or bleeding from your vagina.  You have severe belly cramping or pain.  You  lose or gain weight rapidly.  You have trouble catching your breath and have chest pain.  You notice sudden or extreme puffiness (swelling) of your face, hands, ankles, feet, or legs.  You have not felt the baby move in over an hour.  You have severe headaches that do not go away with medicine.  You have vision changes. This information is not intended to replace advice given to you by your health care provider. Make sure you discuss any questions you have with your health care provider. Document Released: 12/10/2009 Document Revised: 02/21/2016 Document Reviewed: 11/16/2012 Elsevier Interactive Patient Education  2017 [employer].

## 2018-03-04 NOTE — Progress Notes (Signed)
   PRENATAL VISIT NOTE  Subjective:  Brooke Carrillo is a 32 y.o. [phone] at 2075w6d being seen today for ongoing prenatal care.  She is currently monitored for the following issues for this low-risk pregnancy and has Supervision of other normal pregnancy, antepartum; Tobacco abuse; Rh negative state in antepartum period; Vitamin D deficiency; and Gastroesophageal reflux during pregnancy, antepartum, second trimester on their problem list.  Patient reports no bleeding, no leaking, occasional contractions and states a few times per hour.  Contractions: Irritability. Vag. Bleeding: None.  Movement: Present. Denies leaking of fluid.   The following portions of the patient's history were reviewed and updated as appropriate: allergies, current medications, past family history, past medical history, past social history, past surgical history and problem list. Problem list updated.  Objective:   Vitals:   03/04/18 1024  BP: 113/68  Pulse: 89    Fetal Status: Fetal Heart Rate (bpm): 141; doppler Fundal Height: 32 cm Movement: Present  Presentation: Vertex  General:  Alert, oriented and cooperative. Patient is in no acute distress.  Skin: Skin is warm and dry. No rash noted.   Cardiovascular: Normal heart rate noted  Respiratory: Normal respiratory effort, no problems with respiration noted  Abdomen: Soft, gravid, appropriate for gestational age.  Pain/Pressure: Present     Pelvic: Cervical exam performed Dilation: Closed Effacement (%): 50 Station: -3, outer OS 1cm. Soft, midline  Extremities: Normal range of motion.  Edema: Trace  Mental Status: Normal mood and affect. Normal behavior. Normal judgment and thought content.   Assessment and Plan:  Pregnancy: [id number] at 2775w6d  1. Supervision of other normal pregnancy, antepartum     S/P BMZ 5/10&5/11.  Has maternity support belt.  Is out of work.  Is not currently sexually active.   2. Vitamin D deficiency     Taking weekly vitamin D  Preterm  labor symptoms and general obstetric precautions including but not limited to vaginal bleeding, contractions, leaking of fluid and fetal movement were reviewed in detail with the patient. Please refer to After Visit Summary for other counseling recommendations.  Return in about 2 weeks (around 03/18/2018) for ROB.  Future Appointments  Date Time Provider Department Center  03/18/2018 10:45 AM Roe Coombsenney, Tykeem Lanzer A, CNM [facility] None    Roe Coombsachelle A Aristotle Lieb, CNM

## 2018-03-18 ENCOUNTER — Ambulatory Visit (INDEPENDENT_AMBULATORY_CARE_PROVIDER_SITE_OTHER): Payer: Medicaid Other | Admitting: Certified Nurse Midwife

## 2018-03-18 ENCOUNTER — Encounter: Payer: Self-pay | Admitting: Certified Nurse Midwife

## 2018-03-18 VITALS — BP 125/86 | HR 96 | Wt 180.0 lb

## 2018-03-18 DIAGNOSIS — Z3483 Encounter for supervision of other normal pregnancy, third trimester: Secondary | ICD-10-CM

## 2018-03-18 DIAGNOSIS — E559 Vitamin D deficiency, unspecified: Secondary | ICD-10-CM

## 2018-03-18 DIAGNOSIS — Z3480 Encounter for supervision of other normal pregnancy, unspecified trimester: Secondary | ICD-10-CM

## 2018-03-18 NOTE — Progress Notes (Signed)
   PRENATAL VISIT NOTE  Subjective:  Brooke Carrillo is a 32 y.o. [phone] at 6851w6d being seen today for ongoing prenatal care.  She is currently monitored for the following issues for this low-risk pregnancy and has Supervision of other normal pregnancy, antepartum; Tobacco abuse; Rh negative state in antepartum period; Vitamin D deficiency; and Gastroesophageal reflux during pregnancy, antepartum, second trimester on their problem list.  Patient reports no bleeding, no leaking and occasional contractions.  Contractions: Irregular. Vag. Bleeding: None.  Movement: Present. Denies leaking of fluid.   The following portions of the patient's history were reviewed and updated as appropriate: allergies, current medications, past family history, past medical history, past social history, past surgical history and problem list. Problem list updated.  Objective:   Vitals:   03/18/18 1056  BP: 125/86  Pulse: 96  Weight: 180 lb (81.6 kg)    Fetal Status: Fetal Heart Rate (bpm): 158; doppler Fundal Height: 35 cm Movement: Present  Presentation: Vertex  General:  Alert, oriented and cooperative. Patient is in no acute distress.  Skin: Skin is warm and dry. No rash noted.   Cardiovascular: Normal heart rate noted  Respiratory: Normal respiratory effort, no problems with respiration noted  Abdomen: Soft, gravid, appropriate for gestational age.  Pain/Pressure: Present     Pelvic: Cervical exam performed Dilation: Closed Effacement (%): Thick Station: -3  Extremities: Normal range of motion.     Mental Status: Normal mood and affect. Normal behavior. Normal judgment and thought content.   Assessment and Plan:  Pregnancy: [id number] at 5251w6d  1. Supervision of other normal pregnancy, antepartum     Unchanged cervical exam since 2 weeks ago.  Reports irregular contractions, nothing consistent.    2. Vitamin D deficiency     Taking weekly vitamin D  Preterm labor symptoms and general obstetric  precautions including but not limited to vaginal bleeding, contractions, leaking of fluid and fetal movement were reviewed in detail with the patient. Please refer to After Visit Summary for other counseling recommendations.  Return in about 2 weeks (around 04/01/2018) for ROB, [facility].  Future Appointments  Date Time Provider Department Center  03/31/2018 10:45 AM Roe Coombsenney, Shermon Bozzi A, CNM [facility] None    Roe Coombsachelle A Mikah Poss, CNM

## 2018-03-31 ENCOUNTER — Other Ambulatory Visit (HOSPITAL_COMMUNITY)
Admission: RE | Admit: 2018-03-31 | Discharge: 2018-03-31 | Disposition: A | Discharge status: Home / Self Care | Payer: Medicaid Other | Source: Ambulatory Visit | Attending: Certified Nurse Midwife | Admitting: Certified Nurse Midwife

## 2018-03-31 ENCOUNTER — Encounter: Payer: Self-pay | Admitting: Certified Nurse Midwife

## 2018-03-31 ENCOUNTER — Ambulatory Visit (INDEPENDENT_AMBULATORY_CARE_PROVIDER_SITE_OTHER): Payer: Medicaid Other | Admitting: Certified Nurse Midwife

## 2018-03-31 VITALS — BP 121/74 | HR 90 | Wt 187.0 lb

## 2018-03-31 DIAGNOSIS — Z3480 Encounter for supervision of other normal pregnancy, unspecified trimester: Secondary | ICD-10-CM

## 2018-03-31 DIAGNOSIS — E559 Vitamin D deficiency, unspecified: Secondary | ICD-10-CM

## 2018-03-31 DIAGNOSIS — Z3483 Encounter for supervision of other normal pregnancy, third trimester: Secondary | ICD-10-CM | POA: Diagnosis not present

## 2018-03-31 NOTE — Progress Notes (Signed)
   PRENATAL VISIT NOTE  Subjective:  Brooke Carrillo is a 32 y.o. [phone] at 6374w5d being seen today for ongoing prenatal care.  She is currently monitored for the following issues for this low-risk pregnancy and has Supervision of other normal pregnancy, antepartum; Tobacco abuse; Rh negative state in antepartum period; Vitamin D deficiency; and Gastroesophageal reflux during pregnancy, antepartum, second trimester on their problem list.  Patient reports no complaints.  Contractions: Irregular. Vag. Bleeding: None.  Movement: Present. Denies leaking of fluid.   The following portions of the patient's history were reviewed and updated as appropriate: allergies, current medications, past family history, past medical history, past social history, past surgical history and problem list. Problem list updated.  Objective:   Vitals:   03/31/18 1057  BP: 121/74  Pulse: 90  Weight: 187 lb (84.8 kg)    Fetal Status: Fetal Heart Rate (bpm): 144; doppler Fundal Height: 36 cm Movement: Present  Presentation: Vertex  General:  Alert, oriented and cooperative. Patient is in no acute distress.  Skin: Skin is warm and dry. No rash noted.   Cardiovascular: Normal heart rate noted  Respiratory: Normal respiratory effort, no problems with respiration noted  Abdomen: Soft, gravid, appropriate for gestational age.  Pain/Pressure: Present     Pelvic: Cervical exam performed Dilation: 1 Effacement (%): Thick Station: -3  Extremities: Normal range of motion.     Mental Status: Normal mood and affect. Normal behavior. Normal judgment and thought content.   Assessment and Plan:  Pregnancy: [id number] at 3574w5d  1. Supervision of other normal pregnancy, antepartum     Doing well - Strep Gp B NAA - [facility]Chlamydia [facility]  2. Vitamin D deficiency     Taking weekly vitamin D  Preterm labor symptoms and general obstetric precautions including but not limited to vaginal bleeding,  contractions, leaking of fluid and fetal movement were reviewed in detail with the patient. Please refer to After Visit Summary for other counseling recommendations.  Return in about 1 week (around 04/07/2018) for ROB.  No future appointments.  Roe Coombsachelle A Jordy Hewins, CNM

## 2018-04-01 LAB — GC/CHLAMYDIA PROBE AMP (~~LOC~~) NOT AT ARMC
Chlamydia: NEGATIVE
Neisseria Gonorrhea: NEGATIVE

## 2018-04-02 LAB — STREP GP B NAA: Strep Gp B NAA: NEGATIVE

## 2018-04-07 ENCOUNTER — Encounter: Payer: Self-pay | Admitting: Certified Nurse Midwife

## 2018-04-07 ENCOUNTER — Other Ambulatory Visit: Payer: Self-pay | Admitting: Certified Nurse Midwife

## 2018-04-07 ENCOUNTER — Ambulatory Visit (INDEPENDENT_AMBULATORY_CARE_PROVIDER_SITE_OTHER): Payer: Medicaid Other | Admitting: Certified Nurse Midwife

## 2018-04-07 VITALS — BP 115/77 | HR 96 | Wt 190.2 lb

## 2018-04-07 DIAGNOSIS — Z3480 Encounter for supervision of other normal pregnancy, unspecified trimester: Secondary | ICD-10-CM

## 2018-04-07 DIAGNOSIS — Z3483 Encounter for supervision of other normal pregnancy, third trimester: Secondary | ICD-10-CM

## 2018-04-07 DIAGNOSIS — E559 Vitamin D deficiency, unspecified: Secondary | ICD-10-CM

## 2018-04-07 NOTE — Progress Notes (Signed)
   PRENATAL VISIT NOTE  Subjective:  Brooke Carrillo is a 32 y.o. [phone] at 6577w5d being seen today for ongoing prenatal care.  She is currently monitored for the following issues for this low-risk pregnancy and has Supervision of other normal pregnancy, antepartum; Tobacco abuse; Rh negative state in antepartum period; Vitamin D deficiency; and Gastroesophageal reflux during pregnancy, antepartum, second trimester on their problem list.  Patient reports no bleeding, no leaking and occasional contractions.  Contractions: Irregular. Vag. Bleeding: None.  Movement: Present. Denies leaking of fluid.   The following portions of the patient's history were reviewed and updated as appropriate: allergies, current medications, past family history, past medical history, past social history, past surgical history and problem list. Problem list updated.  Objective:   Vitals:   04/07/18 1021  BP: 115/77  Pulse: 96  Weight: 190 lb 3.2 oz (86.3 kg)    Fetal Status: Fetal Heart Rate (bpm): 158; doppler Fundal Height: 36 cm Movement: Present     General:  Alert, oriented and cooperative. Patient is in no acute distress.  Skin: Skin is warm and dry. No rash noted.   Cardiovascular: Normal heart rate noted  Respiratory: Normal respiratory effort, no problems with respiration noted  Abdomen: Soft, gravid, appropriate for gestational age.  Pain/Pressure: Absent     Pelvic: Cervical exam deferred        Extremities: Normal range of motion.  Edema: Trace  Mental Status: Normal mood and affect. Normal behavior. Normal judgment and thought content.   Assessment and Plan:  Pregnancy: [id number] at 7577w5d  1. Supervision of other normal pregnancy, antepartum     Doing well.   2. Vitamin D deficiency     Taking weekly vitamin D.  Preterm labor symptoms and general obstetric precautions including but not limited to vaginal bleeding, contractions, leaking of fluid and fetal movement were reviewed in detail with  the patient. Please refer to After Visit Summary for other counseling recommendations.  Return in about 1 week (around 04/14/2018) for ROB.  Future Appointments  Date Time Provider Department Center  04/07/2018  1:45 PM Roe Coombsenney, Jama Mcmiller A, CNM [facility] None    Roe Coombsachelle A Maleyah Evans, CNM

## 2018-04-07 NOTE — Progress Notes (Signed)
Patient reports good fetal movement with some irregular contractions. 

## 2018-04-09 ENCOUNTER — Encounter (HOSPITAL_COMMUNITY): Payer: Self-pay | Admitting: *Deleted

## 2018-04-09 ENCOUNTER — Inpatient Hospital Stay (HOSPITAL_COMMUNITY)
Admission: AD | Admit: 2018-04-09 | Discharge: 2018-04-09 | Disposition: A | Discharge status: Home / Self Care | Payer: Medicaid Other | Source: Ambulatory Visit | Attending: Obstetrics & Gynecology | Admitting: Obstetrics & Gynecology

## 2018-04-09 DIAGNOSIS — O479 False labor, unspecified: Secondary | ICD-10-CM

## 2018-04-09 DIAGNOSIS — N898 Other specified noninflammatory disorders of vagina: Secondary | ICD-10-CM | POA: Insufficient documentation

## 2018-04-09 DIAGNOSIS — Z0371 Encounter for suspected problem with amniotic cavity and membrane ruled out: Secondary | ICD-10-CM

## 2018-04-09 DIAGNOSIS — O26893 Other specified pregnancy related conditions, third trimester: Secondary | ICD-10-CM | POA: Diagnosis present

## 2018-04-09 DIAGNOSIS — Z3A37 37 weeks gestation of pregnancy: Secondary | ICD-10-CM | POA: Diagnosis not present

## 2018-04-09 NOTE — MAU Note (Signed)
Noted scant amt of blood when wiped earlier, none noted when used the restroom now.  Denies any placental issues.  Hx of PTL, received 2 doses of Betamethasone. Little cramping, ? Feels like something is leaking.

## 2018-04-09 NOTE — MAU Provider Note (Signed)
First Provider Initiated Contact with Patient 04/09/18 1820       S: Ms. Brooke Carrillo is a 32 y.o. [id number] at 5864w0d  who presents to MAU today complaining of leaking of fluid since 1500. She reports that her shorts were wet when she stood up, but she has not had leaking since. She endorses vaginal bleeding. She states that she had some brown mucous when she wiped earlier. She denies contractions. She reports normal fetal movement.    O: BP 129/76   Pulse (!) 118   Temp 98.6 F (37 C)   Resp 18   Ht 5\' 3"  (1.6 m)   Wt 189 lb 4.8 oz (85.9 kg)   LMP 07/24/2017 (Exact Date)   BMI 33.53 kg/m  GENERAL: Well-developed, well-nourished female in no acute distress.  HEAD: Normocephalic, atraumatic.  CHEST: Normal effort of breathing, regular heart rate ABDOMEN: Soft, nontender, gravid PELVIC: Normal external female genitalia. Vagina is pink and rugated. Cervix with normal contour, no lesions. Normal discharge.  Negative pooling.   Cervical exam:  Dilation: 1 Effacement (%): Thick Cervical Position: Posterior Station: -3 Presentation: Vertex Exam by:: H.Chloeanne Poteet,CNM   Fetal Monitoring: Baseline: 145 Variability: moderate Accelerations: 15x15 Decelerations: none Contractions: rare  FERN: negative   A: SIUP at 7564w0d  Membranes intact  P: DC home     Eugenio HoesHogan, Raeford Brandenburg D, CNM 04/09/2018 6:31 PM

## 2018-04-09 NOTE — Discharge Instructions (Signed)
Vaginal delivery means that you will give birth by pushing your baby out of your birth canal (vagina). A team of health care providers will help you before, during, and after vaginal delivery. Birth experiences are unique for every woman and every pregnancy, and birth experiences vary depending on where you choose to give birth. What should I do to prepare for my baby's birth? Before your baby is born, it is important to talk with your health care provider about:  Your labor and delivery preferences. These may include: ? Medicines that you may be given. ? How you will manage your pain. This might include non-medical pain relief techniques or injectable pain relief such as epidural analgesia. ? How you and your baby will be monitored during labor and delivery. ? Who may be in the labor and delivery room with you. ? Your feelings about surgical delivery of your baby (cesarean delivery, or C-section) if this becomes necessary. ? Your feelings about receiving donated blood through an IV tube (blood transfusion) if this becomes necessary.  Whether you are able: ? To take pictures or videos of the birth. ? To eat during labor and delivery. ? To move around, walk, or change positions during labor and delivery.  What to expect after your baby is born, such as: ? Whether delayed umbilical cord clamping and cutting is offered. ? Who will care for your baby right after birth. ? Medicines or tests that may be recommended for your baby. ? Whether breastfeeding is supported in your hospital or birth center. ? How long you will be in the hospital or birth center.  How any medical conditions you have may affect your baby or your labor and delivery experience.  To prepare for your baby's birth, you should also:  Attend all of your health care visits before delivery (prenatal visits) as recommended by your health care provider. This is important.  Prepare your home for your baby's arrival. Make sure  that you have: ? Diapers. ? Baby clothing. ? Feeding equipment. ? Safe sleeping arrangements for you and your baby.  Install a car seat in your vehicle. Have your car seat checked by a certified car seat installer to make sure that it is installed safely.  Think about who will help you with your new baby at home for at least the first several weeks after delivery.  What can I expect when I arrive at the birth center or hospital? Once you are in labor and have been admitted into the hospital or birth center, your health care provider may:  Review your pregnancy history and any concerns you have.  Insert an IV tube into one of your veins. This is used to give you fluids and medicines.  Check your blood pressure, pulse, temperature, and heart rate (vital signs).  Check whether your bag of water (amniotic sac) has broken (ruptured).  Talk with you about your birth plan and discuss pain control options.  Monitoring Your health care provider may monitor your contractions (uterine monitoring) and your baby's heart rate (fetal monitoring). You may need to be monitored:  Often, but not continuously (intermittently).  All the time or for long periods at a time (continuously). Continuous monitoring may be needed if: ? You are taking certain medicines, such as medicine to relieve pain or make your contractions stronger. ? You have pregnancy or labor complications.  Monitoring may be done by:  Placing a special stethoscope or a handheld monitoring device on your abdomen to check your   baby's heartbeat, and feeling your abdomen for contractions. This method of monitoring does not continuously record your baby's heartbeat or your contractions.  Placing monitors on your abdomen (external monitors) to record your baby's heartbeat and the frequency and length of contractions. You may not have to wear external monitors all the time.  Placing monitors inside of your uterus (internal monitors) to  record your baby's heartbeat and the frequency, length, and strength of your contractions. ? Your health care provider may use internal monitors if he or she needs more information about the strength of your contractions or your baby's heart rate. ? Internal monitors are put in place by passing a thin, flexible wire through your vagina and into your uterus. Depending on the type of monitor, it may remain in your uterus or on your baby's head until birth. ? Your health care provider will discuss the benefits and risks of internal monitoring with you and will ask for your permission before inserting the monitors.  Telemetry. This is a type of continuous monitoring that can be done with external or internal monitors. Instead of having to stay in bed, you are able to move around during telemetry. Ask your health care provider if telemetry is an option for you.  Physical exam Your health care provider may perform a physical exam. This may include:  Checking whether your baby is positioned: ? With the head toward your vagina (head-down). This is most common. ? With the head toward the top of your uterus (head-up or breech). If your baby is in a breech position, your health care provider may try to turn your baby to a head-down position so you can deliver vaginally. If it does not seem that your baby can be born vaginally, your provider may recommend surgery to deliver your baby. In rare cases, you may be able to deliver vaginally if your baby is head-up (breech delivery). ? Lying sideways (transverse). Babies that are lying sideways cannot be delivered vaginally.  Checking your cervix to determine: ? Whether it is thinning out (effacing). ? Whether it is opening up (dilating). ? How low your baby has moved into your birth canal.  What are the three stages of labor and delivery?  Normal labor and delivery is divided into the following three stages: Stage 1  Stage 1 is the longest stage of labor,  and it can last for hours or days. Stage 1 includes: ? Early labor. This is when contractions may be irregular, or regular and mild. Generally, early labor contractions are more than 10 minutes apart. ? Active labor. This is when contractions get longer, more regular, more frequent, and more intense. ? The transition phase. This is when contractions happen very close together, are very intense, and may last longer than during any other part of labor.  Contractions generally feel mild, infrequent, and irregular at first. They get stronger, more frequent (about every 2-3 minutes), and more regular as you progress from early labor through active labor and transition.  Many women progress through stage 1 naturally, but you may need help to continue making progress. If this happens, your health care provider may talk with you about: ? Rupturing your amniotic sac if it has not ruptured yet. ? Giving you medicine to help make your contractions stronger and more frequent.  Stage 1 ends when your cervix is completely dilated to 4 inches (10 cm) and completely effaced. This happens at the end of the transition phase. Stage 2  Once your cervix   is completely effaced and dilated to 4 inches (10 cm), you may start to feel an urge to push. It is common for the body to naturally take a rest before feeling the urge to push, especially if you received an epidural or certain other pain medicines. This rest period may last for up to 1-2 hours, depending on your unique labor experience.  During stage 2, contractions are generally less painful, because pushing helps relieve contraction pain. Instead of contraction pain, you may feel stretching and burning pain, especially when the widest part of your baby's head passes through the vaginal opening (crowning).  Your health care provider will closely monitor your pushing progress and your baby's progress through the vagina during stage 2.  Your health care provider may  massage the area of skin between your vaginal opening and anus (perineum) or apply warm compresses to your perineum. This helps it stretch as the baby's head starts to crown, which can help prevent perineal tearing. ? In some cases, an incision may be made in your perineum (episiotomy) to allow the baby to pass through the vaginal opening. An episiotomy helps to make the opening of the vagina larger to allow more room for the baby to fit through.  It is very important to breathe and focus so your health care provider can control the delivery of your baby's head. Your health care provider may have you decrease the intensity of your pushing, to help prevent perineal tearing.  After delivery of your baby's head, the shoulders and the rest of the body generally deliver very quickly and without difficulty.  Once your baby is delivered, the umbilical cord may be cut right away, or this may be delayed for 1-2 minutes, depending on your baby's health. This may vary among health care providers, hospitals, and birth centers.  If you and your baby are healthy enough, your baby may be placed on your chest or abdomen to help maintain the baby's temperature and to help you bond with each other. Some mothers and babies start breastfeeding at this time. Your health care team will dry your baby and help keep your baby warm during this time.  Your baby may need immediate care if he or she: ? Showed signs of distress during labor. ? Has a medical condition. ? Was born too early (prematurely). ? Had a bowel movement before birth (meconium). ? Shows signs of difficulty transitioning from being inside the uterus to being outside of the uterus. If you are planning to breastfeed, your health care team will help you begin a feeding. Stage 3  The third stage of labor starts immediately after the birth of your baby and ends after you deliver the placenta. The placenta is an organ that develops during pregnancy to provide  oxygen and nutrients to your baby in the womb.  Delivering the placenta may require some pushing, and you may have mild contractions. Breastfeeding can stimulate contractions to help you deliver the placenta.  After the placenta is delivered, your uterus should tighten (contract) and become firm. This helps to stop bleeding in your uterus. To help your uterus contract and to control bleeding, your health care provider may: ? Give you medicine by injection, through an IV tube, by mouth, or through your rectum (rectally). ? Massage your abdomen or perform a vaginal exam to remove any blood clots that are left in your uterus. ? Empty your bladder by placing a thin, flexible tube (catheter) into your bladder. ? Encourage you to   breastfeed your baby. After labor is over, you and your baby will be monitored closely to ensure that you are both healthy until you are ready to go home. Your health care team will teach you how to care for yourself and your baby. This information is not intended to replace advice given to you by your health care provider. Make sure you discuss any questions you have with your health care provider. Document Released: 06/24/2008 Document Revised: 04/04/2016 Document Reviewed: 09/30/2015 Elsevier Interactive Patient Education  2018 Elsevier Inc.  

## 2018-04-14 ENCOUNTER — Ambulatory Visit (INDEPENDENT_AMBULATORY_CARE_PROVIDER_SITE_OTHER): Payer: Medicaid Other | Admitting: Nurse Practitioner

## 2018-04-14 DIAGNOSIS — Z3483 Encounter for supervision of other normal pregnancy, third trimester: Secondary | ICD-10-CM

## 2018-04-14 DIAGNOSIS — Z3480 Encounter for supervision of other normal pregnancy, unspecified trimester: Secondary | ICD-10-CM

## 2018-04-14 NOTE — Progress Notes (Signed)
Patient reports good fetal movement with some irregular contractions. 

## 2018-04-14 NOTE — Progress Notes (Signed)
    Subjective:  Brooke Carrillo is a 32 y.o. [phone] at 2315w5d being seen today for ongoing prenatal care.  She is currently monitored for the following issues for this low-risk pregnancy and has Supervision of other normal pregnancy, antepartum; Tobacco abuse; Rh negative state in antepartum period; Vitamin D deficiency; and Gastroesophageal reflux during pregnancy, antepartum, second trimester on their problem list.  Patient reports noticing clear floaters in her eyes.  No flashes, no headaches, no blurry vision..  Contractions: Irregular. Vag. Bleeding: None.  Movement: Present. Denies leaking of fluid.   The following portions of the patient's history were reviewed and updated as appropriate: allergies, current medications, past family history, past medical history, past social history, past surgical history and problem list. Problem list updated.  Objective:   Vitals:   04/14/18 1512  BP: 125/80  Pulse: 93  Weight: 187 lb 12.8 oz (85.2 kg)    Fetal Status: Fetal Heart Rate (bpm): 165 Fundal Height: 38 cm Movement: Present     General:  Alert, oriented and cooperative. Patient is in no acute distress.  Skin: Skin is warm and dry. No rash noted.   Cardiovascular: Normal heart rate noted  Respiratory: Normal respiratory effort, no problems with respiration noted  Abdomen: Soft, gravid, appropriate for gestational age. Pain/Pressure: Absent     Pelvic:  Cervical exam deferred        Extremities: Normal range of motion.  Edema: None  Mental Status: Normal mood and affect. Normal behavior. Normal judgment and thought content.   Urinalysis:      Assessment and Plan:  Pregnancy: [id number] at 6615w5d  1. Supervision of other normal pregnancy, antepartum Has cut back on smoking and is still trying to quit.   Term labor symptoms and general obstetric precautions including but not limited to vaginal bleeding, contractions, leaking of fluid and fetal movement were reviewed in detail with the  patient. Please refer to After Visit Summary for other counseling recommendations.  Return in about 1 week (around 04/21/2018).  Nolene BernheimERRI BURLESON, RN, MSN, NP-BC Nurse Practitioner, [facility], [facility] 04/14/2018 3:48 PM

## 2018-04-22 ENCOUNTER — Encounter: Payer: Self-pay | Admitting: Obstetrics

## 2018-04-22 ENCOUNTER — Ambulatory Visit (INDEPENDENT_AMBULATORY_CARE_PROVIDER_SITE_OTHER): Payer: Medicaid Other | Admitting: Obstetrics

## 2018-04-22 VITALS — BP 121/75 | HR 93 | Wt 188.2 lb

## 2018-04-22 DIAGNOSIS — O368130 Decreased fetal movements, third trimester, not applicable or unspecified: Secondary | ICD-10-CM | POA: Diagnosis not present

## 2018-04-22 DIAGNOSIS — Z3483 Encounter for supervision of other normal pregnancy, third trimester: Secondary | ICD-10-CM

## 2018-04-22 DIAGNOSIS — Z3480 Encounter for supervision of other normal pregnancy, unspecified trimester: Secondary | ICD-10-CM

## 2018-04-22 NOTE — Progress Notes (Signed)
Subjective:  Brooke Carrillo is a 32 y.o. [phone] at 402w6d being seen today for ongoing prenatal care.  She is currently monitored for the following issues for this low-risk pregnancy and has Supervision of other normal pregnancy, antepartum; Tobacco abuse; Rh negative state in antepartum period; Vitamin D deficiency; and Gastroesophageal reflux during pregnancy, antepartum, second trimester on their problem list.  Patient reports occasional contractions.  Contractions: Irregular. Vag. Bleeding: None.  Movement: (!) Decreased. Denies leaking of fluid.   The following portions of the patient's history were reviewed and updated as appropriate: allergies, current medications, past family history, past medical history, past social history, past surgical history and problem list. Problem list updated.  Objective:   Vitals:   04/22/18 1033  BP: 121/75  Pulse: 93  Weight: 188 lb 3.2 oz (85.4 kg)    Fetal Status: Fetal Heart Rate (bpm): 150   Movement: (!) Decreased     General:  Alert, oriented and cooperative. Patient is in no acute distress.  Skin: Skin is warm and dry. No rash noted.   Cardiovascular: Normal heart rate noted  Respiratory: Normal respiratory effort, no problems with respiration noted  Abdomen: Soft, gravid, appropriate for gestational age. Pain/Pressure: Absent     Pelvic:  Cervical exam deferred        Extremities: Normal range of motion.  Edema: Trace  Mental Status: Normal mood and affect. Normal behavior. Normal judgment and thought content.   Urinalysis:      Assessment and Plan:  Pregnancy: [id number] at 702w6d  1. Supervision of other normal pregnancy, antepartum   2. Decreased fetal movement affecting management of pregnancy in third trimester, single or unspecified fetus - NST:  Reactive, Good Variability.  15x15 accels, no decels   Term labor symptoms and general obstetric precautions including but not limited to vaginal bleeding, contractions, leaking of fluid  and fetal movement were reviewed in detail with the patient. Please refer to After Visit Summary for other counseling recommendations.  Return in about 1 week (around 04/29/2018) for ROB.   Brock BadHarper, Caven Perine A, MD

## 2018-04-22 NOTE — Progress Notes (Signed)
Patient reports irregular contractions and a decrease in fetal movement in the last week.

## 2018-04-23 ENCOUNTER — Telehealth (HOSPITAL_COMMUNITY): Payer: Self-pay | Admitting: *Deleted

## 2018-04-23 ENCOUNTER — Encounter (HOSPITAL_COMMUNITY): Payer: Self-pay | Admitting: *Deleted

## 2018-04-23 NOTE — Telephone Encounter (Signed)
Preadmission screen  

## 2018-04-27 ENCOUNTER — Inpatient Hospital Stay (HOSPITAL_COMMUNITY)
Admission: AD | Admit: 2018-04-27 | Discharge: 2018-04-27 | Disposition: A | Discharge status: Home / Self Care | Payer: Medicaid Other | Source: Ambulatory Visit | Attending: Obstetrics & Gynecology | Admitting: Obstetrics & Gynecology

## 2018-04-27 ENCOUNTER — Encounter (HOSPITAL_COMMUNITY): Payer: Self-pay | Admitting: Emergency Medicine

## 2018-04-27 DIAGNOSIS — Z3A39 39 weeks gestation of pregnancy: Secondary | ICD-10-CM | POA: Insufficient documentation

## 2018-04-27 DIAGNOSIS — O4693 Antepartum hemorrhage, unspecified, third trimester: Secondary | ICD-10-CM | POA: Insufficient documentation

## 2018-04-27 DIAGNOSIS — O479 False labor, unspecified: Secondary | ICD-10-CM

## 2018-04-27 LAB — POCT FERN TEST: POCT Fern Test: NEGATIVE

## 2018-04-27 NOTE — MAU Note (Signed)
Pt states she started leaking clear fluid last night at 10:30pm. States it has continued to trickle all day. Pt states she started having contractions last night but they are not regular. Pt denies bleeding. Reports good fetal movement. Cervix was 1.5cm on last exam.

## 2018-04-27 NOTE — MAU Note (Signed)
Urine in lab 

## 2018-04-27 NOTE — MAU Provider Note (Signed)
Brooke Carrillo is a 32 y.o. [id number] female at 8842w4d  RN Labor check, not seen by me, SROM previously ruled out by MAU CNM SVE by RN: Dilation: 1.5 Effacement (%): Thick Station: -3 Exam by:: H.Hardy, RN  NST: FHR baseline 150 bpm, Variability: moderate, Accelerations:present, Decelerations:  Absent= Cat 1/Reactive Toco: irregular  D/C home  Brooke Carrillo CNM, [facility] 04/27/2018 9:48 PM

## 2018-04-27 NOTE — MAU Provider Note (Signed)
S: Ms. Brooke Carrillo is a 32 y.o. [id number] at 3924w4d  who presents to MAU today complaining of leaking of fluid since last night at 10 pm. She endorses vaginal bleeding. She endorses occasional  contractions. She reports normal fetal movement.    O: BP 131/78 (BP Location: Right Arm)   Pulse 99   Temp 98.4 F (36.9 C) (Oral)   Resp 18   Ht 5\' 3"  (1.6 m)   Wt 192 lb (87.1 kg)   LMP 07/24/2017 (Exact Date)   SpO2 100%   BMI 34.01 kg/m  GENERAL: Well-developed, well-nourished female in no acute distress.  HEAD: Normocephalic, atraumatic.  CHEST: Normal effort of breathing, regular heart rate ABDOMEN: Soft, nontender, gravid PELVIC: Normal external female genitalia. Vagina is pink and rugated. Cervix with normal contour, no lesions. Normal discharge.  Negative pooling. Membranes palpable on vaginal exam.   Cervical exam:  Dilation: 1.5 Effacement (%): Thick Cervical Position: Posterior Station: -3 Presentation: Vertex Exam by:: K.Koostra, CNM   Fetal Monitoring: Baseline: 145 Variability: mod Accelerations: present Decelerations: one variable Contractions: irregular; 2 in two minutes  No results found for this or any previous visit (from the past 24 hour(s)).   A: SIUP at 5424w4d  Membranes intact  P: Continue NST; will continue as RN labor eval.   Marylene LandKooistra, Brooke Carrillo, CNM 04/27/2018 8:56 PM

## 2018-04-27 NOTE — Discharge Instructions (Signed)
Braxton Hicks Contractions °Contractions of the uterus can occur throughout pregnancy, but they are not always a sign that you are in labor. You may have practice contractions called Braxton Hicks contractions. These false labor contractions are sometimes confused with true labor. °What are Braxton Hicks contractions? °Braxton Hicks contractions are tightening movements that occur in the muscles of the uterus before labor. Unlike true labor contractions, these contractions do not result in opening (dilation) and thinning of the cervix. Toward the end of pregnancy (32-34 weeks), Braxton Hicks contractions can happen more often and may become stronger. These contractions are sometimes difficult to tell apart from true labor because they can be very uncomfortable. You should not feel embarrassed if you go to the hospital with false labor. °Sometimes, the only way to tell if you are in true labor is for your health care provider to look for changes in the cervix. The health care provider will do a physical exam and may monitor your contractions. If you are not in true labor, the exam should show that your cervix is not dilating and your water has not broken. °If there are other health problems associated with your pregnancy, it is completely safe for you to be sent home with false labor. You may continue to have Braxton Hicks contractions until you go into true labor. °How to tell the difference between true labor and false labor °True labor °· Contractions last 30-70 seconds. °· Contractions become very regular. °· Discomfort is usually felt in the top of the uterus, and it spreads to the lower abdomen and low back. °· Contractions do not go away with walking. °· Contractions usually become more intense and increase in frequency. °· The cervix dilates and gets thinner. °False labor °· Contractions are usually shorter and not as strong as true labor contractions. °· Contractions are usually irregular. °· Contractions  are often felt in the front of the lower abdomen and in the groin. °· Contractions may go away when you walk around or change positions while lying down. °· Contractions get weaker and are shorter-lasting as time goes on. °· The cervix usually does not dilate or become thin. °Follow these instructions at home: °· Take over-the-counter and prescription medicines only as told by your health care provider. °· Keep up with your usual exercises and follow other instructions from your health care provider. °· Eat and drink lightly if you think you are going into labor. °· If Braxton Hicks contractions are making you uncomfortable: °? Change your position from lying down or resting to walking, or change from walking to resting. °? Sit and rest in a tub of warm water. °? Drink enough fluid to keep your urine pale yellow. Dehydration may cause these contractions. °? Do slow and deep breathing several times an hour. °· Keep all follow-up prenatal visits as told by your health care provider. This is important. °Contact a health care provider if: °· You have a fever. °· You have continuous pain in your abdomen. °Get help right away if: °· Your contractions become stronger, more regular, and closer together. °· You have fluid leaking or gushing from your vagina. °· You pass blood-tinged mucus (bloody show). °· You have bleeding from your vagina. °· You have low back pain that you never had before. °· You feel your baby’s head pushing down and causing pelvic pressure. °· Your baby is not moving inside you as much as it used to. °Summary °· Contractions that occur before labor are called Braxton   Hicks contractions, false labor, or practice contractions. °· Braxton Hicks contractions are usually shorter, weaker, farther apart, and less regular than true labor contractions. True labor contractions usually become progressively stronger and regular and they become more frequent. °· Manage discomfort from Braxton Hicks contractions by  changing position, resting in a warm bath, drinking plenty of water, or practicing deep breathing. °This information is not intended to replace advice given to you by your health care provider. Make sure you discuss any questions you have with your health care provider. °Document Released: 01/29/2017 Document Revised: 01/29/2017 Document Reviewed: 01/29/2017 °Elsevier Interactive Patient Education © 2018 [employer] Inc. ° °

## 2018-04-29 ENCOUNTER — Encounter: Payer: Self-pay | Admitting: Obstetrics

## 2018-04-29 ENCOUNTER — Other Ambulatory Visit: Payer: Self-pay

## 2018-04-29 ENCOUNTER — Ambulatory Visit (INDEPENDENT_AMBULATORY_CARE_PROVIDER_SITE_OTHER): Payer: Medicaid Other | Admitting: Obstetrics

## 2018-04-29 VITALS — BP 119/75 | HR 89 | Wt 191.0 lb

## 2018-04-29 DIAGNOSIS — Z6791 Unspecified blood type, Rh negative: Secondary | ICD-10-CM

## 2018-04-29 DIAGNOSIS — O26899 Other specified pregnancy related conditions, unspecified trimester: Secondary | ICD-10-CM

## 2018-04-29 DIAGNOSIS — Z3480 Encounter for supervision of other normal pregnancy, unspecified trimester: Secondary | ICD-10-CM

## 2018-04-29 DIAGNOSIS — Z3483 Encounter for supervision of other normal pregnancy, third trimester: Secondary | ICD-10-CM

## 2018-04-29 NOTE — Patient Instructions (Signed)
Skin to Skin After delivery, the staff will place your baby on your chest. This helps with the following: . Regulates baby's temperature, breathing, heart rate and blood sugar . Increases Mom's milk supply . Promotes bonding . Keeps baby and Mom calm and decreases baby's crying  

## 2018-04-29 NOTE — Progress Notes (Signed)
Subjective:  Brooke Carrillo is a 32 y.o. [phone] at 8828w6d being seen today for ongoing prenatal care.  She is currently monitored for the following issues for this low-risk pregnancy and has Supervision of other normal pregnancy, antepartum; Tobacco abuse; Rh negative state in antepartum period; Vitamin D deficiency; and Gastroesophageal reflux during pregnancy, antepartum, second trimester on their problem list.  Patient reports occasional contractions.  Contractions: Irregular. Vag. Bleeding: None.  Movement: Present. Denies leaking of fluid.   The following portions of the patient's history were reviewed and updated as appropriate: allergies, current medications, past family history, past medical history, past social history, past surgical history and problem list. Problem list updated.  Objective:   Vitals:   04/29/18 1042  BP: 119/75  Pulse: 89  Weight: 191 lb (86.6 kg)    Fetal Status: Fetal Heart Rate (bpm): 150   Movement: Present     General:  Alert, oriented and cooperative. Patient is in no acute distress.  Skin: Skin is warm and dry. No rash noted.   Cardiovascular: Normal heart rate noted  Respiratory: Normal respiratory effort, no problems with respiration noted  Abdomen: Soft, gravid, appropriate for gestational age. Pain/Pressure: Present     Pelvic:  Cervical exam deferred        Extremities: Normal range of motion.  Edema: Trace  Mental Status: Normal mood and affect. Normal behavior. Normal judgment and thought content.   Urinalysis:      Assessment and Plan:  Pregnancy: [id number] at 6628w6d  1. Supervision of other normal pregnancy, antepartum  2. Rh negative state in antepartum period - Rhogam postpartum  Term labor symptoms and general obstetric precautions including but not limited to vaginal bleeding, contractions, leaking of fluid and fetal movement were reviewed in detail with the patient. Please refer to After Visit Summary for other counseling  recommendations.  Return in about 1 week (around 05/06/2018) for ROB.   Brock BadHarper, Angello Chien A, MD

## 2018-05-04 ENCOUNTER — Inpatient Hospital Stay (HOSPITAL_COMMUNITY): Payer: Medicaid Other | Admitting: Anesthesiology

## 2018-05-04 ENCOUNTER — Inpatient Hospital Stay (HOSPITAL_COMMUNITY)
Admission: AD | Admit: 2018-05-04 | Discharge: 2018-05-05 | DRG: 807 | Disposition: A | Discharge status: Home / Self Care | Payer: Medicaid Other | Attending: Internal Medicine | Admitting: Internal Medicine

## 2018-05-04 ENCOUNTER — Encounter (HOSPITAL_COMMUNITY): Payer: Self-pay | Admitting: *Deleted

## 2018-05-04 DIAGNOSIS — O326XX0 Maternal care for compound presentation, not applicable or unspecified: Secondary | ICD-10-CM | POA: Diagnosis present

## 2018-05-04 DIAGNOSIS — Z3480 Encounter for supervision of other normal pregnancy, unspecified trimester: Secondary | ICD-10-CM

## 2018-05-04 DIAGNOSIS — O99334 Smoking (tobacco) complicating childbirth: Secondary | ICD-10-CM | POA: Diagnosis present

## 2018-05-04 DIAGNOSIS — F17210 Nicotine dependence, cigarettes, uncomplicated: Secondary | ICD-10-CM | POA: Diagnosis present

## 2018-05-04 DIAGNOSIS — Z6791 Unspecified blood type, Rh negative: Secondary | ICD-10-CM | POA: Diagnosis not present

## 2018-05-04 DIAGNOSIS — Z3483 Encounter for supervision of other normal pregnancy, third trimester: Secondary | ICD-10-CM | POA: Diagnosis present

## 2018-05-04 DIAGNOSIS — Z3A40 40 weeks gestation of pregnancy: Secondary | ICD-10-CM | POA: Diagnosis not present

## 2018-05-04 DIAGNOSIS — O26893 Other specified pregnancy related conditions, third trimester: Secondary | ICD-10-CM | POA: Diagnosis present

## 2018-05-04 DIAGNOSIS — O26899 Other specified pregnancy related conditions, unspecified trimester: Secondary | ICD-10-CM

## 2018-05-04 LAB — CBC
HCT: 34.2 % — ABNORMAL LOW (ref 36.0–46.0)
Hemoglobin: 11.6 g/dL — ABNORMAL LOW (ref 12.0–15.0)
MCH: 31.6 pg (ref 26.0–34.0)
MCHC: 33.9 g/dL (ref 30.0–36.0)
MCV: 93.2 fL (ref 78.0–100.0)
Platelets: 335 10*3/uL (ref 150–400)
RBC: 3.67 MIL/uL — ABNORMAL LOW (ref 3.87–5.11)
RDW: 14.1 % (ref 11.5–15.5)
WBC: 19.1 10*3/uL — ABNORMAL HIGH (ref 4.0–10.5)

## 2018-05-04 LAB — RPR: RPR Ser Ql: NONREACTIVE

## 2018-05-04 LAB — TYPE AND SCREEN
ABO/RH(D): O NEG
Antibody Screen: NEGATIVE

## 2018-05-04 MED ORDER — TETANUS-DIPHTH-ACELL PERTUSSIS 5-2.5-18.5 LF-MCG/0.5 IM SUSP: 0.5000 mL | Freq: Once | INTRAMUSCULAR | Status: DC

## 2018-05-04 MED ORDER — BISACODYL 10 MG RE SUPP: 10.0000 mg | Freq: Every day | RECTAL | Status: DC | PRN

## 2018-05-04 MED ORDER — BENZOCAINE-MENTHOL 20-0.5 % EX AERO: 1.0000 "application " | INHALATION_SPRAY | CUTANEOUS | Status: DC | PRN

## 2018-05-04 MED ORDER — COCONUT OIL OIL
1.0000 "application " | TOPICAL_OIL | Status: DC | PRN
  Administered 2018-05-05: 1 via TOPICAL
  Filled 2018-05-04: qty 120

## 2018-05-04 MED ORDER — OXYTOCIN BOLUS FROM INFUSION
500.0000 mL | Freq: Once | INTRAVENOUS | Status: AC
  Administered 2018-05-04: 500 mL via INTRAVENOUS

## 2018-05-04 MED ORDER — DIBUCAINE 1 % RE OINT: 1.0000 "application " | TOPICAL_OINTMENT | RECTAL | Status: DC | PRN

## 2018-05-04 MED ORDER — ACETAMINOPHEN 325 MG PO TABS
650.0000 mg | ORAL_TABLET | ORAL | Status: DC | PRN
  Filled 2018-05-04: qty 2

## 2018-05-04 MED ORDER — ONDANSETRON HCL 4 MG PO TABS: 4.0000 mg | ORAL_TABLET | ORAL | Status: DC | PRN

## 2018-05-04 MED ORDER — OXYTOCIN 40 UNITS IN LACTATED RINGERS INFUSION - SIMPLE MED
INTRAVENOUS | Status: AC
  Filled 2018-05-04: qty 1000

## 2018-05-04 MED ORDER — EPHEDRINE 5 MG/ML INJ: 10.0000 mg | INTRAVENOUS | Status: DC | PRN

## 2018-05-04 MED ORDER — LIDOCAINE HCL (PF) 1 % IJ SOLN
30.0000 mL | INTRAMUSCULAR | Status: DC | PRN
  Filled 2018-05-04: qty 30

## 2018-05-04 MED ORDER — SOD CITRATE-CITRIC ACID 500-334 MG/5ML PO SOLN: 30.0000 mL | ORAL | Status: DC | PRN

## 2018-05-04 MED ORDER — LACTATED RINGERS IV SOLN: 500.0000 mL | INTRAVENOUS | Status: DC | PRN

## 2018-05-04 MED ORDER — LIDOCAINE HCL (PF) 1 % IJ SOLN
INTRAMUSCULAR | Status: DC | PRN
  Administered 2018-05-04: 5 mL via EPIDURAL

## 2018-05-04 MED ORDER — OXYCODONE-ACETAMINOPHEN 5-325 MG PO TABS: 1.0000 | ORAL_TABLET | ORAL | Status: DC | PRN

## 2018-05-04 MED ORDER — PHENYLEPHRINE 40 MCG/ML (10ML) SYRINGE FOR IV PUSH (FOR BLOOD PRESSURE SUPPORT): 80.0000 ug | PREFILLED_SYRINGE | INTRAVENOUS | Status: DC | PRN

## 2018-05-04 MED ORDER — ONDANSETRON HCL 4 MG/2ML IJ SOLN: 4.0000 mg | INTRAMUSCULAR | Status: DC | PRN

## 2018-05-04 MED ORDER — OXYCODONE-ACETAMINOPHEN 5-325 MG PO TABS: 2.0000 | ORAL_TABLET | ORAL | Status: DC | PRN

## 2018-05-04 MED ORDER — PRENATAL MULTIVITAMIN CH
1.0000 | ORAL_TABLET | Freq: Every day | ORAL | Status: DC
  Administered 2018-05-05: 1 via ORAL
  Filled 2018-05-04: qty 1

## 2018-05-04 MED ORDER — FLEET ENEMA 7-19 GM/118ML RE ENEM: 1.0000 | ENEMA | Freq: Every day | RECTAL | Status: DC | PRN

## 2018-05-04 MED ORDER — LACTATED RINGERS IV SOLN
INTRAVENOUS | Status: DC
  Administered 2018-05-04 (×2): via INTRAVENOUS

## 2018-05-04 MED ORDER — DIPHENHYDRAMINE HCL 50 MG/ML IJ SOLN: 12.5000 mg | INTRAMUSCULAR | Status: DC | PRN

## 2018-05-04 MED ORDER — LACTATED RINGERS IV SOLN: 500.0000 mL | Freq: Once | INTRAVENOUS | Status: DC

## 2018-05-04 MED ORDER — FENTANYL CITRATE (PF) 100 MCG/2ML IJ SOLN: 50.0000 ug | INTRAMUSCULAR | Status: DC | PRN

## 2018-05-04 MED ORDER — WITCH HAZEL-GLYCERIN EX PADS: 1.0000 "application " | MEDICATED_PAD | CUTANEOUS | Status: DC | PRN

## 2018-05-04 MED ORDER — ONDANSETRON HCL 4 MG/2ML IJ SOLN: 4.0000 mg | Freq: Four times a day (QID) | INTRAMUSCULAR | Status: DC | PRN

## 2018-05-04 MED ORDER — OXYTOCIN 40 UNITS IN LACTATED RINGERS INFUSION - SIMPLE MED: 2.5000 [IU]/h | INTRAVENOUS | Status: DC

## 2018-05-04 MED ORDER — SIMETHICONE 80 MG PO CHEW: 80.0000 mg | CHEWABLE_TABLET | ORAL | Status: DC | PRN

## 2018-05-04 MED ORDER — OXYCODONE HCL 5 MG PO TABS
5.0000 mg | ORAL_TABLET | Freq: Four times a day (QID) | ORAL | Status: AC | PRN
Start: 2018-05-04 — End: 2018-05-04
  Administered 2018-05-04 (×2): 5 mg via ORAL
  Filled 2018-05-04 (×2): qty 1

## 2018-05-04 MED ORDER — ACETAMINOPHEN 325 MG PO TABS
650.0000 mg | ORAL_TABLET | ORAL | Status: DC | PRN
  Administered 2018-05-04: 650 mg via ORAL

## 2018-05-04 MED ORDER — DIPHENHYDRAMINE HCL 25 MG PO CAPS: 25.0000 mg | ORAL_CAPSULE | Freq: Four times a day (QID) | ORAL | Status: DC | PRN

## 2018-05-04 MED ORDER — FENTANYL 2.5 MCG/ML BUPIVACAINE 1/10 % EPIDURAL INFUSION (WH - ANES)
14.0000 mL/h | INTRAMUSCULAR | Status: DC | PRN
  Administered 2018-05-04: 14 mL/h via EPIDURAL
  Filled 2018-05-04: qty 100

## 2018-05-04 MED ORDER — SENNOSIDES-DOCUSATE SODIUM 8.6-50 MG PO TABS
2.0000 | ORAL_TABLET | ORAL | Status: DC
  Administered 2018-05-04: 2 via ORAL
  Filled 2018-05-04: qty 2

## 2018-05-04 MED ORDER — IBUPROFEN 600 MG PO TABS
600.0000 mg | ORAL_TABLET | Freq: Four times a day (QID) | ORAL | Status: DC
  Administered 2018-05-04 – 2018-05-05 (×5): 600 mg via ORAL
  Filled 2018-05-04 (×5): qty 1

## 2018-05-04 MED ORDER — PHENYLEPHRINE 40 MCG/ML (10ML) SYRINGE FOR IV PUSH (FOR BLOOD PRESSURE SUPPORT)
80.0000 ug | PREFILLED_SYRINGE | INTRAVENOUS | Status: DC | PRN
  Filled 2018-05-04: qty 10

## 2018-05-04 NOTE — Anesthesia Procedure Notes (Signed)
Epidural Patient location during procedure: OB Start time: 05/04/2018 8:58 AM End time: 05/04/2018 9:12 AM  Staffing Anesthesiologist: Trevor IhaHouser, Osvaldo Lamping A, MD Performed: anesthesiologist   Preanesthetic Checklist Completed: patient identified, site marked, surgical consent, pre-op evaluation, timeout performed, IV checked, risks and benefits discussed and monitors and equipment checked  Epidural Patient position: sitting Prep: site prepped and draped and DuraPrep Patient monitoring: continuous pulse ox and blood pressure Approach: midline Location: L3-L4 Injection technique: LOR air  Needle:  Needle type: Tuohy  Needle gauge: 17 G Needle length: 9 cm and 9 Needle insertion depth: 5 cm cm Catheter type: closed end flexible Catheter size: 19 Gauge Catheter at skin depth: 11 cm Test dose: negative  Assessment Events: blood not aspirated, injection not painful, no injection resistance, negative IV test and no paresthesia  Additional Notes One attempt. Patient tolerated procedure well.

## 2018-05-04 NOTE — Lactation Note (Signed)
This note was copied from a baby's chart. Lactation Consultation Note  Patient Name: Brooke Carrillo ZOXWR'UToday's Date: 05/04/2018 Reason for consult: Initial assessment;Term Breastfeeding consultation services and support information given to patient.  Mom states she will try to breastfeed but also plans on giving formula.  This is her fourth baby and previous babies would not latch.  Newborn is 5 hours old and has been to breast once and formula fed once.  Baby is currently sleeping in crib.  Instructed mom to watch for feeding cues and call out for assist prn.  Mom is interested in pumping if baby does not latch.  Maternal Data Does the patient have breastfeeding experience prior to this delivery?: No  Feeding Feeding Type: Bottle Fed - Formula  LATCH Score                   Interventions    Lactation Tools Discussed/Used     Consult Status Consult Status: Follow-up Date: 05/05/18 Follow-up type: In-patient    Huston FoleyMOULDEN, Michaele Amundson S 05/04/2018, 4:19 PM

## 2018-05-04 NOTE — Anesthesia Preprocedure Evaluation (Signed)
Anesthesia Evaluation  Patient identified by MRN, date of birth, ID band Patient awake    Reviewed: Allergy & Precautions, NPO status , Patient's Chart, lab work & pertinent test results  Airway Mallampati: II  TM Distance: >3 FB Neck ROM: Full    Dental no notable dental hx. (+) Teeth Intact   Pulmonary Current Smoker,    Pulmonary exam normal breath sounds clear to auscultation       Cardiovascular Exercise Tolerance: Good negative cardio ROS Normal cardiovascular exam Rhythm:Regular Rate:Normal     Neuro/Psych  Headaches, negative psych ROS   GI/Hepatic Neg liver ROS, GERD  ,  Endo/Other  negative endocrine ROS  Renal/GU negative Renal ROS     Musculoskeletal   Abdominal   Peds  Hematology   Anesthesia Other Findings   Reproductive/Obstetrics (+) Pregnancy                             Anesthesia Physical Anesthesia Plan  ASA: III  Anesthesia Plan: Epidural   Post-op Pain Management:    Induction:   PONV Risk Score and Plan:   Airway Management Planned:   Additional Equipment:   Intra-op Plan:   Post-operative Plan:   Informed Consent: I have reviewed the patients History and Physical, chart, labs and discussed the procedure including the risks, benefits and alternatives for the proposed anesthesia with the patient or authorized representative who has indicated his/her understanding and acceptance.     Plan Discussed with:   Anesthesia Plan Comments:         Lab Results  Component Value Date   WBC 19.1 (H) 05/04/2018   HGB 11.6 (L) 05/04/2018   HCT 34.2 (L) 05/04/2018   MCV 93.2 05/04/2018   PLT 335 05/04/2018    Anesthesia Quick Evaluation

## 2018-05-04 NOTE — H&P (Addendum)
Obstetrics Admission History & Physical   Brooke Carrillo is a 32 y.o. female [phone] at 3915w4d presenting for contractions starting this morning. Denies vaginal bleeding, vaginal discharge, dysuria, contractions, LOF. Endorses fetal movement.  Pregnancy has been complicated by Rh neg, tobacco use, +FFN (02/04/18), Vit D def  [facility]  OB History    Gravida  8   Para  3   Term  3   Preterm  0   AB  4   Living  3     SAB  3   TAB  1   Ectopic  0   Multiple  0   Live Births  3          Past Medical History:  Diagnosis Date  . Headache   . Medical history non-contributory   . No pertinent past medical history   . Pregnancy as incidental finding    Past Surgical History:  Procedure Laterality Date  . ORIF FINGER / THUMB FRACTURE    . WISDOM TOOTH EXTRACTION     Family History: family history includes Cancer in her maternal aunt, maternal grandmother, paternal grandfather, and paternal uncle; Dementia in her mother; HIV in her maternal aunt; Heart disease in her father; Hypertension in her father and paternal grandmother; Stroke in her father and maternal uncle. Social History:  reports that she has been smoking cigarettes.  She has been smoking about 0.50 packs per day. She has never used smokeless tobacco. She reports that she does not drink alcohol or use drugs.     Maternal Diabetes: No Genetic Screening: Normal Maternal Ultrasounds/Referrals: Normal Fetal Ultrasounds or other Referrals:  None Maternal Substance Abuse:  No Significant Maternal Medications:  None Significant Maternal Lab Results:  None Other Comments:  S/P bmz 01/2018 FOR +ffn  ROS History Dilation: 6 Effacement (%): 100 Station: -1 Exam by:: Weston,RN Blood pressure (!) 148/63, pulse 85, temperature (!) 97.4 F (36.3 C), temperature source Oral, resp. rate 20, height 5\' 3"  (1.6 m), weight 85.3 kg (188 lb), last menstrual period 07/24/2017, SpO2 99  %. Exam Physical Exam   Cervical exam: 6/100/1+ at 0830 by RN   Prenatal labs: ABO, Rh: O/Negative/-- (01/15 1616) Antibody: Negative (01/15 1616) Rubella: 2.25 (01/15 1616) RPR: Non Reactive (05/09 1059)  HBsAg: Negative (01/15 1616)  HIV: Non Reactive (05/09 1059)  GBS: Negative (07/03 1134)   Last US 01/2018: posterior placenta, spine not well visualized on 11/2017 (low risk AFP)   Assessment/Plan: Admit for spontaneous labor Patient desires epidural  Routine intrapartum care. Method of infant feeding: breast Contraception: BTL (looking for mature papers)  Candis SchatzPatricia Dell 05/04/2018, 9:00 AM   OB FELLOW HISTORY AND PHYSICAL ATTESTATION  I have seen and examined this patient; I agree with above documentation in the resident's note.   Marcy Sirenatherine Murlene Revell, D.O. OB Fellow  05/04/2018, 11:08 AM

## 2018-05-04 NOTE — MAU Note (Signed)
contractions 

## 2018-05-05 MED ORDER — SENNOSIDES-DOCUSATE SODIUM 8.6-50 MG PO TABS: 2.0000 | ORAL_TABLET | ORAL | 0 refills | Status: DC

## 2018-05-05 MED ORDER — IBUPROFEN 600 MG PO TABS: 600.0000 mg | ORAL_TABLET | Freq: Four times a day (QID) | ORAL | 0 refills | Status: DC

## 2018-05-05 MED ORDER — OXYCODONE HCL 5 MG PO TABS
5.0000 mg | ORAL_TABLET | ORAL | Status: DC | PRN
  Administered 2018-05-05 (×3): 5 mg via ORAL
  Filled 2018-05-05 (×3): qty 1

## 2018-05-05 NOTE — Discharge Instructions (Signed)
Postpartum Care After Vaginal Delivery °The period of time right after you deliver your newborn is called the postpartum period. °What kind of medical care will I receive? °· You may continue to receive fluids and medicines through an IV tube inserted into one of your veins. °· If an incision was made near your vagina (episiotomy) or if you had some vaginal tearing during delivery, cold compresses may be placed on your episiotomy or your tear. This helps to reduce pain and swelling. °· You may be given a squirt bottle to use when you go to the bathroom. You may use this until you are comfortable wiping as usual. To use the squirt bottle, follow these steps: °? Before you urinate, fill the squirt bottle with warm water. Do not use hot water. °? After you urinate, while you are sitting on the toilet, use the squirt bottle to rinse the area around your urethra and vaginal opening. This rinses away any urine and blood. °? You may do this instead of wiping. As you start healing, you may use the squirt bottle before wiping yourself. Make sure to wipe gently. °? Fill the squirt bottle with clean water every time you use the bathroom. °· You will be given sanitary pads to wear. °How can I expect to feel? °· You may not feel the need to urinate for several hours after delivery. °· You will have some soreness and pain in your abdomen and vagina. °· If you are breastfeeding, you may have uterine contractions every time you breastfeed for up to several weeks postpartum. Uterine contractions help your uterus return to its normal size. °· It is normal to have vaginal bleeding (lochia) after delivery. The amount and appearance of lochia is often similar to a menstrual period in the first week after delivery. It will gradually decrease over the next few weeks to a dry, yellow-brown discharge. For most women, lochia stops completely by 6-8 weeks after delivery. Vaginal bleeding can vary from woman to woman. °· Within the first few  days after delivery, you may have breast engorgement. This is when your breasts feel heavy, full, and uncomfortable. Your breasts may also throb and feel hard, tightly stretched, warm, and tender. After this occurs, you may have milk leaking from your breasts. Your health care provider can help you relieve discomfort due to breast engorgement. Breast engorgement should go away within a few days. °· You may feel more sad or worried than normal due to hormonal changes after delivery. These feelings should not last more than a few days. If these feelings do not go away after several days, speak with your health care provider. °How should I care for myself? °· Tell your health care provider if you have pain or discomfort. °· Drink enough water to keep your urine clear or pale yellow. °· Wash your hands thoroughly with soap and water for at least 20 seconds after changing your sanitary pads, after using the toilet, and before holding or feeding your baby. °· If you are not breastfeeding, avoid touching your breasts a lot. Doing this can make your breasts produce more milk. °· If you become weak or lightheaded, or you feel like you might faint, ask for help before: °? Getting out of bed. °? Showering. °· Change your sanitary pads frequently. Watch for any changes in your flow, such as a sudden increase in volume, a change in color, the passing of large blood clots. If you pass a blood clot from your vagina, save it   to show to your health care provider. Do not flush blood clots down the toilet without having your health care provider look at them. °· Make sure that all your vaccinations are up to date. This can help protect you and your baby from getting certain diseases. You may need to have immunizations done before you leave the hospital. °· If desired, talk with your health care provider about methods of family planning or birth control (contraception). °How can I start bonding with my baby? °Spending as much time as  possible with your baby is very important. During this time, you and your baby can get to know each other and develop a bond. Having your baby stay with you in your room (rooming in) can give you time to get to know your baby. Rooming in can also help you become comfortable caring for your baby. Breastfeeding can also help you bond with your baby. °How can I plan for returning home with my baby? °· Make sure that you have a car seat installed in your vehicle. °? Your car seat should be checked by a certified car seat installer to make sure that it is installed safely. °? Make sure that your baby fits into the car seat safely. °· Ask your health care provider any questions you have about caring for yourself or your baby. Make sure that you are able to contact your health care provider with any questions after leaving the hospital. °This information is not intended to replace advice given to you by your health care provider. Make sure you discuss any questions you have with your health care provider. °Document Released: 07/13/2007 Document Revised: 02/18/2016 Document Reviewed: 08/20/2015 °Elsevier Interactive Patient Education © 2018 [employer] ° °

## 2018-05-05 NOTE — Plan of Care (Signed)
Pt. Condition will continue to improve 

## 2018-05-05 NOTE — Progress Notes (Signed)
Post Partum Day 1 Subjective: Patient has no complaints. She is breast feeding and reports that this is going well. Denies chest pain, shortness of breath, LE edema, RUQ pain, headaches, vision changes, fevers, or worsening vaginal bleeding. She will be having a BTL.   Objective: Blood pressure 107/60, pulse 70, temperature 97.6 F (36.4 C), temperature source Oral, resp. rate 20, height 5\' 3"  (1.6 m), weight 188 lb (85.3 kg), last menstrual period 07/24/2017, SpO2 98 %, unknown if currently breastfeeding.  Physical Exam:  General: alert and no distress Lochia: appropriate Uterine Fundus: firm DVT Evaluation: No evidence of DVT seen on physical exam.  No results for input(s): HGB, HCT in the last 72 hours.  Assessment/Plan: Brooke HighDeana Carrillo is a [id number] who delivered a baby girl via NSVD yesterday at [id number].  She is doing well with no complaints and desires a tubal ligation. Will schedule interval tubal.    LOS: 1 day   Federico FlakeKimberly Niles Aniken Monestime 05/19/2018, 11:07 AM

## 2018-05-05 NOTE — Lactation Note (Signed)
This note was copied from a baby's chart. Lactation Consultation Note  Patient Name: Brooke Carrillo HighDeana Taplin ZOXWR'UToday's Date: 05/05/2018    Mom reports increased feeding frequency & says her nipples are sore. On assessment, her nipples noted to be atraumatic. She reports that when infant releases latch, that her nipples are elongated and rounded. Shells were offered to eliminate nipple friction; Mom agreed. Shells provided & explained how to use. A hand pump was also provided (to be shown how to use at a later time).   Mom denies breast changes w/this or previous pregnancies. Infant is currently sleeping (having just fed recently).   Lurline HareRichey, Jizel Cheeks [facility] 05/05/2018, 2:10 PM

## 2018-05-05 NOTE — Discharge Summary (Signed)
OB Discharge Summary     Patient Name: Brooke SeraDeana M Truby DOB: 06/15/1986 MRN: [id number]  Date of admission: 05/04/2018 Delivering MD: Candis SchatzELL, PATRICIA   Date of discharge: 05/05/2018  Admitting diagnosis: LABOR Intrauterine pregnancy: 3170w4d     Secondary diagnosis:  Active Problems:   Supervision of other normal pregnancy, antepartum   Rh negative state in antepartum period   Labor without complication   Shoulder dystocia, delivered   Indication for care in labor or delivery  Additional problems:  Tobacco Use  +FFN  Vitamin D Deficiency      Discharge diagnosis: Term Pregnancy Delivered                                                                                                Post partum procedures:none  Augmentation: none  Complications: None  Hospital course:  Onset of Labor With Vaginal Delivery     32 y.o. yo [id number] at 2970w4d was admitted in Active Labor on 05/04/2018. Patient had an uncomplicated labor course as follows:  Membrane Rupture Time/Date: 10:25 AM ,05/04/2018   Intrapartum Procedures: Episiotomy: None [1]                                         Lacerations:  None [1]  Patient had a delivery of a Viable infant. 05/04/2018  Information for the patient's newborn:  Payton EmeraldReid, Girl Mattilyn [[id number]][[id number]]  Delivery Method: Vaginal, Spontaneous(Filed from Delivery Summary)    Pateint had an uncomplicated postpartum course.  She is ambulating, tolerating a regular diet, passing flatus, and urinating well. Patient is discharged home in stable condition on 05/05/18.   Physical exam  Vitals:   05/04/18 1726 05/05/18 0001 05/05/18 0553 05/05/18 1408  BP: (!) 117/56 103/63 115/70 107/60  Pulse: 85 75 70 70  Resp: 18 18 18 20   Temp: 98.2 F (36.8 C) 98.1 F (36.7 C) 98 F (36.7 C) 97.6 F (36.4 C)  TempSrc: Oral   Oral  SpO2: 99% 99% 98%   Weight:      Height:       General: alert and cooperative Lochia: appropriate Uterine Fundus: firm Incision: N/A DVT Evaluation:  No evidence of DVT seen on physical exam. Labs: Lab Results  Component Value Date   WBC 19.1 (H) 05/04/2018   HGB 11.6 (L) 05/04/2018   HCT 34.2 (L) 05/04/2018   MCV 93.2 05/04/2018   PLT 335 05/04/2018   CMP Latest Ref Rng & Units 03/04/2016  Glucose 65 - 99 mg/dL 83  BUN 6 - 20 mg/dL 11  Creatinine 5.180.44 - 8.411.00 mg/dL 6.600.77  Sodium 630135 - 160145 mmol/L 137  Potassium 3.5 - 5.1 mmol/L 4.2  Chloride 101 - 111 mmol/L 107  CO2 22 - 32 mmol/L 26  Calcium 8.9 - 10.3 mg/dL 9.5  Total Protein 6.5 - 8.1 g/dL 7.0  Total Bilirubin 0.3 - 1.2 mg/dL 0.4  Alkaline Phos 38 - 126 U/L 59  AST 15 - 41 U/L 14(L)  ALT 14 - 54  U/L 9(L)    Discharge instruction: per After Visit Summary and "Baby and Me Booklet".  After visit meds:  Allergies as of 05/05/2018   No Known Allergies     Medication List    TAKE these medications   ibuprofen 600 MG tablet Commonly known as:  ADVIL,MOTRIN Take 1 tablet (600 mg total) by mouth every 6 (six) hours.   OB COMPLETE PETITE 35-5-1-200 MG Caps Take 1 tablet by mouth daily.   senna-docusate 8.6-50 MG tablet Commonly known as:  Senokot-S Take 2 tablets by mouth daily. Start taking on:  05/06/2018       Diet: routine diet  Activity: Advance as tolerated. Pelvic rest for 6 weeks.   Outpatient follow up:4 weeks Follow up Appt: Future Appointments  Date Time Provider Department Center  06/01/2018  3:00 PM Hermina Staggers, MD [facility] None   Follow up Visit:No follow-ups on file.  Postpartum contraception: considering BTL  Newborn Data: Live born female  Birth Weight: 8 lb 15.4 oz (4065 g) APGAR: 9, 9  Newborn Delivery   Time head delivered:  05/04/2018 10:51:00 Birth date/time:  05/04/2018 10:52:00 Delivery type:  Vaginal, Spontaneous     Baby Feeding: Breast Disposition:home with mother   05/05/2018 De Hollingshead, DO

## 2018-05-05 NOTE — Anesthesia Postprocedure Evaluation (Signed)
Anesthesia Post Note  Patient: Brooke Carrillo  Procedure(s) Performed: AN AD HOC LABOR EPIDURAL     Patient location during evaluation: Mother Baby Anesthesia Type: Epidural Level of consciousness: awake and alert Pain management: pain level controlled Vital Signs Assessment: post-procedure vital signs reviewed and stable Respiratory status: spontaneous breathing, nonlabored ventilation and respiratory function stable Cardiovascular status: stable Postop Assessment: no headache, no backache, epidural receding, no apparent nausea or vomiting, able to ambulate, patient able to bend at knees and adequate PO intake Anesthetic complications: no    Last Vitals:  Vitals:   05/05/18 0001 05/05/18 0553  BP: 103/63 115/70  Pulse: 75 70  Resp: 18 18  Temp: 36.7 C 36.7 C  SpO2: 99% 98%    Last Pain:  Vitals:   05/05/18 0044  TempSrc:   PainSc: 4    Pain Goal: Patients Stated Pain Goal: 3 (05/05/18 0001)               Donnalee CurryMalinova,Clenton Esper Hristova

## 2018-05-06 ENCOUNTER — Encounter: Payer: Medicaid Other | Admitting: Obstetrics and Gynecology

## 2018-05-06 ENCOUNTER — Telehealth: Payer: Self-pay | Admitting: *Deleted

## 2018-05-06 NOTE — Telephone Encounter (Signed)
Pt called to office stating that she has delivered and is now home. Pt states she did not receive any pain medication at discharge.  Attempt to return call, no answer. Left detailed message, per DPR, making pt aware that Narcotics are not usually given with vaginal deliveries. She may take 800mg  Ibuprofen every 6 hours as needed for pain.  Advised that if she is having severe pain, [facility]. Advised to call office if any other questions.

## 2018-05-07 ENCOUNTER — Inpatient Hospital Stay (HOSPITAL_COMMUNITY): Admission: RE | Admit: 2018-05-07 | Payer: Medicaid Other | Source: Ambulatory Visit

## 2018-06-01 ENCOUNTER — Encounter: Payer: Self-pay | Admitting: Obstetrics and Gynecology

## 2018-06-01 ENCOUNTER — Ambulatory Visit (INDEPENDENT_AMBULATORY_CARE_PROVIDER_SITE_OTHER): Payer: Medicaid Other | Admitting: Obstetrics and Gynecology

## 2018-06-01 DIAGNOSIS — Z3009 Encounter for other general counseling and advice on contraception: Secondary | ICD-10-CM | POA: Insufficient documentation

## 2018-06-01 DIAGNOSIS — Z30016 Encounter for initial prescription of transdermal patch hormonal contraceptive device: Secondary | ICD-10-CM

## 2018-06-01 DIAGNOSIS — Z309 Encounter for contraceptive management, unspecified: Secondary | ICD-10-CM | POA: Insufficient documentation

## 2018-06-01 DIAGNOSIS — Z1389 Encounter for screening for other disorder: Secondary | ICD-10-CM

## 2018-06-01 MED ORDER — NORELGESTROMIN-ETH ESTRADIOL 150-35 MCG/24HR TD PTWK: 1.0000 | MEDICATED_PATCH | TRANSDERMAL | 12 refills | Status: DC

## 2018-06-01 NOTE — Patient Instructions (Signed)
Health Maintenance, Female Adopting a healthy lifestyle and getting preventive care can go a long way to promote health and wellness. Talk with your health care provider about what schedule of regular examinations is right for you. This is a good chance for you to check in with your provider about disease prevention and staying healthy. In between checkups, there are plenty of things you can do on your own. Experts have done a lot of research about which lifestyle changes and preventive measures are most likely to keep you healthy. Ask your health care provider for more information. Weight and diet Eat a healthy diet  Be sure to include plenty of vegetables, fruits, low-fat dairy products, and lean protein.  Do not eat a lot of foods high in solid fats, added sugars, or salt.  Get regular exercise. This is one of the most important things you can do for your health. ? Most adults should exercise for at least 150 minutes each week. The exercise should increase your heart rate and make you sweat (moderate-intensity exercise). ? Most adults should also do strengthening exercises at least twice a week. This is in addition to the moderate-intensity exercise.  Maintain a healthy weight  Body mass index (BMI) is a measurement that can be used to identify possible weight problems. It estimates body fat based on height and weight. Your health care provider can help determine your BMI and help you achieve or maintain a healthy weight.  For females 20 years of age and older: ? A BMI below 18.5 is considered underweight. ? A BMI of 18.5 to 24.9 is normal. ? A BMI of 25 to 29.9 is considered overweight. ? A BMI of 30 and above is considered obese.  Watch levels of cholesterol and blood lipids  You should start having your blood tested for lipids and cholesterol at 32 years of age, then have this test every 5 years.  You may need to have your cholesterol levels checked more often if: ? Your lipid or  cholesterol levels are high. ? You are older than 32 years of age. ? You are at high risk for heart disease.  Cancer screening Lung Cancer  Lung cancer screening is recommended for adults 55-80 years old who are at high risk for lung cancer because of a history of smoking.  A yearly low-dose CT scan of the lungs is recommended for people who: ? Currently smoke. ? Have quit within the past 15 years. ? Have at least a 30-pack-year history of smoking. A pack year is smoking an average of one pack of cigarettes a day for 1 year.  Yearly screening should continue until it has been 15 years since you quit.  Yearly screening should stop if you develop a health problem that would prevent you from having lung cancer treatment.  Breast Cancer  Practice breast self-awareness. This means understanding how your breasts normally appear and feel.  It also means doing regular breast self-exams. Let your health care provider know about any changes, no matter how small.  If you are in your 20s or 30s, you should have a clinical breast exam (CBE) by a health care provider every 1-3 years as part of a regular health exam.  If you are 40 or older, have a CBE every year. Also consider having a breast X-ray (mammogram) every year.  If you have a family history of breast cancer, talk to your health care provider about genetic screening.  If you are at high risk   for breast cancer, talk to your health care provider about having an MRI and a mammogram every year.  Breast cancer gene (BRCA) assessment is recommended for women who have family members with BRCA-related cancers. BRCA-related cancers include: ? Breast. ? Ovarian. ? Tubal. ? Peritoneal cancers.  Results of the assessment will determine the need for genetic counseling and BRCA1 and BRCA2 testing.  Cervical Cancer Your health care provider may recommend that you be screened regularly for cancer of the pelvic organs (ovaries, uterus, and  vagina). This screening involves a pelvic examination, including checking for microscopic changes to the surface of your cervix (Pap test). You may be encouraged to have this screening done every 3 years, beginning at age 22.  For women ages 56-65, health care providers may recommend pelvic exams and Pap testing every 3 years, or they may recommend the Pap and pelvic exam, combined with testing for human papilloma virus (HPV), every 5 years. Some types of HPV increase your risk of cervical cancer. Testing for HPV may also be done on women of any age with unclear Pap test results.  Other health care providers may not recommend any screening for nonpregnant women who are considered low risk for pelvic cancer and who do not have symptoms. Ask your health care provider if a screening pelvic exam is right for you.  If you have had past treatment for cervical cancer or a condition that could lead to cancer, you need Pap tests and screening for cancer for at least 20 years after your treatment. If Pap tests have been discontinued, your risk factors (such as having a new sexual partner) need to be reassessed to determine if screening should resume. Some women have medical problems that increase the chance of getting cervical cancer. In these cases, your health care provider may recommend more frequent screening and Pap tests.  Colorectal Cancer  This type of cancer can be detected and often prevented.  Routine colorectal cancer screening usually begins at 32 years of age and continues through 32 years of age.  Your health care provider may recommend screening at an earlier age if you have risk factors for colon cancer.  Your health care provider may also recommend using home test kits to check for hidden blood in the stool.  A small camera at the end of a tube can be used to examine your colon directly (sigmoidoscopy or colonoscopy). This is done to check for the earliest forms of colorectal  cancer.  Routine screening usually begins at age 33.  Direct examination of the colon should be repeated every 5-10 years through 32 years of age. However, you may need to be screened more often if early forms of precancerous polyps or small growths are found.  Skin Cancer  Check your skin from head to toe regularly.  Tell your health care provider about any new moles or changes in moles, especially if there is a change in a mole's shape or color.  Also tell your health care provider if you have a mole that is larger than the size of a pencil eraser.  Always use sunscreen. Apply sunscreen liberally and repeatedly throughout the day.  Protect yourself by wearing long sleeves, pants, a wide-brimmed hat, and sunglasses whenever you are outside.  Heart disease, diabetes, and high blood pressure  High blood pressure causes heart disease and increases the risk of stroke. High blood pressure is more likely to develop in: ? People who have blood pressure in the high end of  the normal range (130-139/85-89 mm Hg). ? People who are overweight or obese. ? People who are African American.  If you are 21-29 years of age, have your blood pressure checked every 3-5 years. If you are 3 years of age or older, have your blood pressure checked every year. You should have your blood pressure measured twice-once when you are at a hospital or clinic, and once when you are not at a hospital or clinic. Record the average of the two measurements. To check your blood pressure when you are not at a hospital or clinic, you can use: ? An automated blood pressure machine at a pharmacy. ? A home blood pressure monitor.  If you are between 17 years and 37 years old, ask your health care provider if you should take aspirin to prevent strokes.  Have regular diabetes screenings. This involves taking a blood sample to check your fasting blood sugar level. ? If you are at a normal weight and have a low risk for diabetes,  have this test once every three years after 32 years of age. ? If you are overweight and have a high risk for diabetes, consider being tested at a younger age or more often. Preventing infection Hepatitis B  If you have a higher risk for hepatitis B, you should be screened for this virus. You are considered at high risk for hepatitis B if: ? You were born in a country where hepatitis B is common. Ask your health care provider which countries are considered high risk. ? Your parents were born in a high-risk country, and you have not been immunized against hepatitis B (hepatitis B vaccine). ? You have HIV or AIDS. ? You use needles to inject street drugs. ? You live with someone who has hepatitis B. ? You have had sex with someone who has hepatitis B. ? You get hemodialysis treatment. ? You take certain medicines for conditions, including cancer, organ transplantation, and autoimmune conditions.  Hepatitis C  Blood testing is recommended for: ? Everyone born from [birth year] through 1965. ? Anyone with known risk factors for hepatitis C.  Sexually transmitted infections (STIs)  You should be screened for sexually transmitted infections (STIs) including gonorrhea and chlamydia if: ? You are sexually active and are younger than 32 years of age. ? You are older than 32 years of age and your health care provider tells you that you are at risk for this type of infection. ? Your sexual activity has changed since you were last screened and you are at an increased risk for chlamydia or gonorrhea. Ask your health care provider if you are at risk.  If you do not have HIV, but are at risk, it may be recommended that you take a prescription medicine daily to prevent HIV infection. This is called pre-exposure prophylaxis (PrEP). You are considered at risk if: ? You are sexually active and do not regularly use condoms or know the HIV status of your partner(s). ? You take drugs by injection. ? You are  sexually active with a partner who has HIV.  Talk with your health care provider about whether you are at high risk of being infected with HIV. If you choose to begin PrEP, you should first be tested for HIV. You should then be tested every 3 months for as long as you are taking PrEP. Pregnancy  If you are premenopausal and you may become pregnant, ask your health care provider about preconception counseling.  If you may become  pregnant, take 400 to 800 micrograms (mcg) of folic acid every day.  If you want to prevent pregnancy, talk to your health care provider about birth control (contraception). Osteoporosis and menopause  Osteoporosis is a disease in which the bones lose minerals and strength with aging. This can result in serious bone fractures. Your risk for osteoporosis can be identified using a bone density scan.  If you are 13 years of age or older, or if you are at risk for osteoporosis and fractures, ask your health care provider if you should be screened.  Ask your health care provider whether you should take a calcium or vitamin D supplement to lower your risk for osteoporosis.  Menopause may have certain physical symptoms and risks.  Hormone replacement therapy may reduce some of these symptoms and risks. Talk to your health care provider about whether hormone replacement therapy is right for you. Follow these instructions at home:  Schedule regular health, dental, and eye exams.  Stay current with your immunizations.  Do not use any tobacco products including cigarettes, chewing tobacco, or electronic cigarettes.  If you are pregnant, do not drink alcohol.  If you are breastfeeding, limit how much and how often you drink alcohol.  Limit alcohol intake to no more than 1 drink per day for nonpregnant women. One drink equals 12 ounces of beer, 5 ounces of wine, or 1 ounces of hard liquor.  Do not use street drugs.  Do not share needles.  Ask your health care  provider for help if you need support or information about quitting drugs.  Tell your health care provider if you often feel depressed.  Tell your health care provider if you have ever been abused or do not feel safe at home. This information is not intended to replace advice given to you by your health care provider. Make sure you discuss any questions you have with your health care provider. Document Released: 03/31/2011 Document Revised: 02/21/2016 Document Reviewed: 06/19/2015 Elsevier Interactive Patient Education  2018 [employer]. Laparoscopic Tubal Ligation, Care After Refer to this sheet in the next few weeks. These instructions provide you with information about caring for yourself after your procedure. Your health care provider may also give you more specific instructions. Your treatment has been planned according to current medical practices, but problems sometimes occur. Call your health care provider if you have any problems or questions after your procedure. What can I expect after the procedure? After the procedure, it is common to have:  A sore throat.  Discomfort in your shoulder.  Mild discomfort or cramping in your abdomen.  Gas pains.  Pain or soreness in the area where the surgical cut (incision) was made.  A bloated feeling.  Tiredness.  Nausea.  Vomiting.  Follow these instructions at home: Medicines  Take over-the-counter and prescription medicines only as told by your health care provider.  Do not take aspirin because it can cause bleeding.  Do not drive or operate heavy machinery while taking prescription pain medicine. Activity  Rest for the rest of the day.  Return to your normal activities as told by your health care provider. Ask your health care provider what activities are safe for you. Incision care   Follow instructions from your health care provider about how to take care of your incision. Make sure you: ? Wash your hands with soap  and water before you change your bandage (dressing). If soap and water are not available, use hand sanitizer. ? Change your  dressing as told by your health care provider. ? Leave stitches (sutures) in place. They may need to stay in place for 2 weeks or longer.  Check your incision area every day for signs of infection. Check for: ? More redness, swelling, or pain. ? More fluid or blood. ? Warmth. ? Pus or a bad smell. Other Instructions  Do not take baths, swim, or use a hot tub until your health care provider approves. You may take showers.  Keep all follow-up visits as told by your health care provider. This is important.  Have someone help you with your daily household tasks for the first few days. Contact a health care provider if:  You have more redness, swelling, or pain around your incision.  Your incision feels warm to the touch.  You have pus or a bad smell coming from your incision.  The edges of your incision break open after the sutures have been removed.  Your pain does not improve after 2-3 days.  You have a rash.  You repeatedly become dizzy or light-headed.  Your pain medicine is not helping.  You are constipated. Get help right away if:  You have a fever.  You faint.  You have increasing pain in your abdomen.  You have severe pain in one or both of your shoulders.  You have fluid or blood coming from your sutures or from your vagina.  You have shortness of breath or difficulty breathing.  You have chest pain or leg pain.  You have ongoing nausea, vomiting, or diarrhea. This information is not intended to replace advice given to you by your health care provider. Make sure you discuss any questions you have with your health care provider. Document Released: 04/04/2005 Document Revised: 02/18/2016 Document Reviewed: 08/26/2015 Elsevier Interactive Patient Education  2018 [employer]  

## 2018-06-01 NOTE — Progress Notes (Signed)
Post Partum Exam  Brooke Carrillo is a 32 y.o. [phone] female who presents for a postpartum visit. She is 4 weeks postpartum following a spontaneous vaginal delivery. I have fully reviewed the prenatal and intrapartum course. The delivery was at 40 gestational weeks.  Anesthesia: epidural. Postpartum course has been unremarkable Baby's course has been unremarkable. Baby is feeding by bottle - Gerber Gentle. Bleeding no bleeding. Bowel function is normal. Bladder function is normal. Patient is not sexually active. Contraception method is none. Postpartum depression screening:neg  The following portions of the patient's history were reviewed and updated as appropriate: allergies, current medications, past family history, past medical history, past social history and past surgical history. Last pap smear done 11/292017 and was Normal  Review of Systems Pertinent items noted in HPI and remainder of comprehensive ROS otherwise negative.    Objective:  Last menstrual period 07/24/2017, unknown if currently breastfeeding.  General:  alert   Breasts:  not examined  Lungs: clear to auscultation bilaterally  Heart:  regular rate and rhythm, S1, S2 normal, no murmur, click, rub or gallop  Abdomen: soft, non-tender; bowel sounds normal; no masses,  no organomegaly   Vulva:  not evaluated  Vagina: not evaluated  Cervix:  not examined  Corpus: not examined  Adnexa:  not evaluated  Rectal Exam: Not performed.        Assessment:    NL postpartum exam.  BTL papers signed today, patch until BTL   Plan:   1. Contraception: Ortho-Evra patches weekly 2. Return to nl ADL's 3. Follow up with post op appt

## 2018-06-02 ENCOUNTER — Encounter (HOSPITAL_COMMUNITY): Payer: Self-pay

## 2018-06-21 ENCOUNTER — Encounter (HOSPITAL_COMMUNITY): Payer: Self-pay

## 2018-07-12 NOTE — Patient Instructions (Addendum)
Your procedure is scheduled on: Friday, Oct 25@10  am  Enter through the Main Entrance of [facility] at: 8:30 am  Pick up the phone at the desk and dial [phone].  Call this number if you have problems the morning of surgery: [phone].  Remember: Do NOT eat food or Do NOT drink clear liquids (including water) after midnight Thursday.  Take these medicines the morning of surgery with a SIP OF WATER: None  Brush your teeth on the day of surgery.  Bring inhaler with you on day of surgery.  Do Not smoke on the day of surgery.  Stop herbal medications, vitamin supplements, Ibuprofen/NSAIDS at this time.  Do NOT wear jewelry (body piercing), metal hair clips/bobby pins, make-up, or nail polish. Do NOT wear lotions, powders, or perfumes.  You may wear deoderant. Do NOT shave for 48 hours prior to surgery. Do NOT bring valuables to the hospital. Contacts, dentures, or bridgework may not be worn into surgery.  Leave suitcase in car.  After surgery it may be brought to your room.  For patients admitted to the hospital, checkout time is 11:00 AM the day of discharge. Have a responsible adult drive you home and stay with you for 24 hours after your procedure.

## 2018-07-16 ENCOUNTER — Inpatient Hospital Stay (HOSPITAL_COMMUNITY)
Admission: RE | Admit: 2018-07-16 | Discharge: 2018-07-16 | Disposition: A | Discharge status: Home / Self Care | Payer: Medicaid Other | Source: Ambulatory Visit

## 2018-07-19 ENCOUNTER — Encounter (HOSPITAL_COMMUNITY): Payer: Self-pay | Admitting: Emergency Medicine

## 2018-07-19 ENCOUNTER — Emergency Department (HOSPITAL_COMMUNITY)
Admission: EM | Admit: 2018-07-19 | Discharge: 2018-07-19 | Disposition: A | Discharge status: Home / Self Care | Payer: Medicaid Other | Attending: Emergency Medicine | Admitting: Emergency Medicine

## 2018-07-19 ENCOUNTER — Other Ambulatory Visit: Payer: Self-pay

## 2018-07-19 DIAGNOSIS — F17210 Nicotine dependence, cigarettes, uncomplicated: Secondary | ICD-10-CM | POA: Diagnosis not present

## 2018-07-19 DIAGNOSIS — J020 Streptococcal pharyngitis: Secondary | ICD-10-CM | POA: Diagnosis not present

## 2018-07-19 DIAGNOSIS — Z79899 Other long term (current) drug therapy: Secondary | ICD-10-CM | POA: Insufficient documentation

## 2018-07-19 DIAGNOSIS — J029 Acute pharyngitis, unspecified: Secondary | ICD-10-CM | POA: Diagnosis present

## 2018-07-19 LAB — GROUP A STREP BY PCR: Group A Strep by PCR: DETECTED — AB

## 2018-07-19 MED ORDER — PENICILLIN G BENZATHINE 1200000 UNIT/2ML IM SUSP
1.2000 10*6.[IU] | Freq: Once | INTRAMUSCULAR | Status: AC
  Administered 2018-07-19: 1.2 10*6.[IU] via INTRAMUSCULAR
  Filled 2018-07-19: qty 2

## 2018-07-19 NOTE — ED Provider Notes (Signed)
[facility] EMERGENCY DEPARTMENT Provider Note   CSN: [id number] Arrival date & time: 07/19/18  1801     History   Chief Complaint Chief Complaint  Patient presents with  . Sore Throat    HPI Brooke Carrillo is a 32 y.o. female.  HPI   Brooke Carrillo is a 32 y.o. female, presenting to the ED with sore throat for the past several days.  Accompanied by lymphadenopathy and occasional cough. States she thinks she has strep throat.  Denies fever/chills, difficulty swallowing, shortness of breath, chest pain, nausea/vomiting, or any other complaints   Past Medical History:  Diagnosis Date  . Headache   . Medical history non-contributory   . No pertinent past medical history   . Pregnancy as incidental finding     Patient Active Problem List   Diagnosis Date Noted  . Postpartum care and examination 06/01/2018  . Unwanted fertility 06/01/2018  . Contraception management 06/01/2018  . Vitamin D deficiency 10/21/2017  . Tobacco abuse 10/13/2017    Past Surgical History:  Procedure Laterality Date  . ORIF FINGER / THUMB FRACTURE    . WISDOM TOOTH EXTRACTION       OB History    Gravida  8   Para  4   Term  4   Preterm  0   AB  4   Living  4     SAB  3   TAB  1   Ectopic  0   Multiple  0   Live Births  4            Home Medications    Prior to Admission medications   Medication Sig Start Date End Date Taking? Authorizing Provider  norelgestromin-ethinyl estradiol (ORTHO EVRA) 150-35 MCG/24HR transdermal patch Place 1 patch onto the skin once a week. Patient taking differently: Place 1 patch onto the skin every Wednesday.  06/01/18   Hermina Staggers, MD    Family History Family History  Problem Relation Age of Onset  . Hypertension Father   . Stroke Father   . Heart disease Father   . Dementia Mother   . Cancer Maternal Aunt        Breast  . HIV Maternal Aunt   . Stroke Maternal Uncle   . Cancer Paternal Uncle    Colon  . Cancer Maternal Grandmother   . Hypertension Paternal Grandmother   . Cancer Paternal Grandfather        Colon    Social History Social History   Tobacco Use  . Smoking status: Current Every Day Smoker    Packs/day: 0.50    Types: Cigarettes  . Smokeless tobacco: Never Used  Substance Use Topics  . Alcohol use: No    Alcohol/week: 0.0 standard drinks  . Drug use: No     Allergies   Patient has no known allergies.   Review of Systems Review of Systems  Constitutional: Negative for chills and fever.  HENT: Positive for sore throat. Negative for facial swelling, trouble swallowing and voice change.   Respiratory: Positive for cough. Negative for shortness of breath.   Cardiovascular: Negative for chest pain.  Gastrointestinal: Negative for abdominal pain, diarrhea, nausea and vomiting.  Musculoskeletal: Negative for neck stiffness.  All other systems reviewed and are negative.    Physical Exam Updated Vital Signs BP 118/69   Pulse 87   Temp 98.7 F (37.1 C) (Oral)   Resp 16   SpO2 98%  Physical Exam  Constitutional: She appears well-developed and well-nourished. No distress.  HENT:  Head: Normocephalic and atraumatic.  Mouth/Throat: Uvula is midline. Posterior oropharyngeal edema and posterior oropharyngeal erythema present.  Handles oral secretions without difficulty.  Eyes: Conjunctivae are normal.  Neck: Normal range of motion. Neck supple.  Cardiovascular: Normal rate, regular rhythm and intact distal pulses.  Pulmonary/Chest: Effort normal and breath sounds normal. No respiratory distress.  Abdominal: There is no guarding.  Musculoskeletal: She exhibits no edema.  Lymphadenopathy:    She has cervical adenopathy.  Neurological: She is alert.  Skin: Skin is warm and dry. She is not diaphoretic.  Psychiatric: She has a normal mood and affect. Her behavior is normal.  Nursing note and vitals reviewed.    ED Treatments / Results  Labs (all  labs ordered are listed, but only abnormal results are displayed) Labs Reviewed  GROUP A STREP BY PCR - Abnormal; Notable for the following components:      Result Value   Group A Strep by PCR DETECTED (*)    All other components within normal limits    EKG None  Radiology No results found.  Procedures Procedures (including critical care time)  Medications Ordered in ED Medications  penicillin g benzathine (BICILLIN LA) 1200000 UNIT/2ML injection 1.2 Million Units (has no administration in time range)     Initial Impression / Assessment and Plan / ED Course  I have reviewed the triage vital signs and the nursing notes.  Pertinent labs & imaging results that were available during my care of the patient were reviewed by me and considered in my medical decision making (see chart for details).     Patient presents with a sore throat.  Nontoxic-appearing.  Positive strep test.  Treated here in the ED with penicillin.  Symptomatic care discussed. Patient voices understanding of these instructions, accepts the plan, and is comfortable with discharge.  Final Clinical Impressions(s) / ED Diagnoses   Final diagnoses:  Strep pharyngitis    ED Discharge Orders    None       Concepcion Living 07/19/18 1937    Vanetta Mulders, MD 07/20/18 [phone]

## 2018-07-19 NOTE — ED Triage Notes (Signed)
Sore throat for a few days. Believe she may have strep. Denies fevers.

## 2018-07-19 NOTE — Discharge Instructions (Addendum)
You have been seen today for sore throat.  The strep test was positive. You have been treated for this in the ED. Hand washing: Wash your hands throughout the day, but especially before and after touching the face, using the restroom, sneezing, coughing, or touching surfaces that have been coughed or sneezed upon. Hydration: Symptoms will be intensified and complicated by dehydration. Dehydration can also extend the duration of symptoms. Drink plenty of fluids and get plenty of rest. You should be drinking at least half a liter of water an hour to stay hydrated. Electrolyte drinks (ex. Gatorade, Powerade, Pedialyte) are also encouraged. You should be drinking enough fluids to make your urine light yellow, almost clear. If this is not the case, you are not drinking enough water. Please note that some of the treatments indicated below will not be effective if you are not adequately hydrated. Diet: Please concentrate on hydration, however, you may introduce food slowly.  Start with a clear liquid diet, progressed to a full liquid diet, and then bland solids as you are able. Pain or fever: Ibuprofen, Naproxen, or Tylenol for pain or fever. (see below for suggested regimen) Antiinflammatory medications: Take 600 mg of ibuprofen every 6 hours or 440 mg (over the counter dose) to 500 mg (prescription dose) of naproxen every 12 hours for the next 3 days. After this time, these medications may be used as needed for pain. Take these medications with food to avoid upset stomach. Choose only one of these medications, do not take them together. Tylenol: Should you continue to have additional pain while taking the ibuprofen or naproxen, you may add in tylenol as needed. Your daily total maximum amount of tylenol from all sources should be limited to 4000mg /day for persons without liver problems, or 2000mg /day for those with liver problems. Sore throat: Warm liquids or Chloraseptic spray may help soothe a sore throat.  Gargle twice a day with a salt water solution made from a half teaspoon of salt in a cup of warm water.  Follow up: Follow up with a primary care provider, as needed, for any future management of this issue.  For prescription assistance, may try using prescription discount sites or apps, such as goodrx.com

## 2018-07-22 NOTE — H&P (Signed)
Brooke Carrillo is an 32 y.o. female. [name] presents for BTL.   Menstrual History: Menarche age: 77 No LMP recorded.    Past Medical History:  Diagnosis Date  . Headache   . Medical history non-contributory   . No pertinent past medical history   . Pregnancy as incidental finding     Past Surgical History:  Procedure Laterality Date  . ORIF FINGER / THUMB FRACTURE    . WISDOM TOOTH EXTRACTION      Family History  Problem Relation Age of Onset  . Hypertension Father   . Stroke Father   . Heart disease Father   . Dementia Mother   . Cancer Maternal Aunt        Breast  . HIV Maternal Aunt   . Stroke Maternal Uncle   . Cancer Paternal Uncle        Colon  . Cancer Maternal Grandmother   . Hypertension Paternal Grandmother   . Cancer Paternal Grandfather        Colon    Social History:  reports that she has been smoking cigarettes. She has been smoking about 0.50 packs per day. She has never used smokeless tobacco. She reports that she does not drink alcohol or use drugs.  Allergies: No Known Allergies  No medications prior to admission.    Review of Systems  Constitutional: Negative.   Respiratory: Negative.   Cardiovascular: Negative.   Gastrointestinal: Negative.   Genitourinary: Negative.     unknown if currently breastfeeding. Physical Exam  Constitutional: She appears well-developed and well-nourished.  Cardiovascular: Normal rate and regular rhythm.  Respiratory: Effort normal and breath sounds normal.  Genitourinary: Vagina normal and uterus normal.  Musculoskeletal: Normal range of motion.    No results found for this or any previous visit (from the past 24 hour(s)).  No results found.  Assessment/Plan: Unwanted fertility  Patient desires bilateral tubal sterilization.  Other reversible forms of contraception were discussed with patient; she declines all other modalities. Discussed bilateral tubal sterilization in detail; discussed options of  laparoscopic bilateral tubal sterilization using Filshie clips vs laparoscopic bilateral salpingectomy. Risks and benefits discussed in detail including but not limited to: risk of regret, permanence of method, bleeding, infection, injury to surrounding organs and need for additional procedures.  Failure risk of 1-2 % for Filshie clips and <1% for bilateral salpingectomy with increased risk of ectopic gestation if pregnancy occurs was also discussed with patient.  Also discussed possible reduction of risk of ovarian cancer via bilateral salpingectomy given that a growing body of knowledge reveals that the majority of cases of high grade serous "ovarian" cancer actually are actually  cancers arising from the fimbriated end of the fallopian tubes. Emphasized that removal of fallopian tubes do not result in any known hormonal imbalance.  Patient verbalized understanding of these risks and benefits and wants to proceed with sterilization with laparoscopic bilateral sterilization using Filshie clips.      Brooke Carrillo 07/22/2018, 12:02 PM

## 2018-07-23 ENCOUNTER — Ambulatory Visit (HOSPITAL_COMMUNITY): Payer: Medicaid Other | Admitting: Anesthesiology

## 2018-07-23 ENCOUNTER — Other Ambulatory Visit: Payer: Self-pay

## 2018-07-23 ENCOUNTER — Encounter (HOSPITAL_COMMUNITY): Payer: Self-pay

## 2018-07-23 ENCOUNTER — Ambulatory Visit (HOSPITAL_COMMUNITY)
Admission: AD | Admit: 2018-07-23 | Discharge: 2018-07-23 | Disposition: A | Discharge status: Home / Self Care | Payer: Medicaid Other | Source: Ambulatory Visit | Attending: Obstetrics and Gynecology | Admitting: Obstetrics and Gynecology

## 2018-07-23 ENCOUNTER — Encounter (HOSPITAL_COMMUNITY)
Admission: AD | Disposition: A | Discharge status: Home / Self Care | Payer: Self-pay | Source: Ambulatory Visit | Attending: Obstetrics and Gynecology

## 2018-07-23 DIAGNOSIS — Z302 Encounter for sterilization: Secondary | ICD-10-CM | POA: Diagnosis present

## 2018-07-23 DIAGNOSIS — F17210 Nicotine dependence, cigarettes, uncomplicated: Secondary | ICD-10-CM | POA: Diagnosis not present

## 2018-07-23 HISTORY — PX: LAPAROSCOPIC TUBAL LIGATION: SHX1937

## 2018-07-23 LAB — BASIC METABOLIC PANEL
Anion gap: 10 (ref 5–15)
BUN: 12 mg/dL (ref 6–20)
CO2: 26 mmol/L (ref 22–32)
Calcium: 9.3 mg/dL (ref 8.9–10.3)
Chloride: 102 mmol/L (ref 98–111)
Creatinine, Ser: 0.86 mg/dL (ref 0.44–1.00)
GFR calc Af Amer: >60 mL/min (ref >60)
GFR calc non Af Amer: >60 mL/min (ref >60)
Glucose, Bld: 92 mg/dL (ref 70–99)
Potassium: 3.7 mmol/L (ref 3.5–5.1)
Sodium: 138 mmol/L (ref 135–145)

## 2018-07-23 LAB — CBC
HCT: 39.4 % (ref 36.0–46.0)
Hemoglobin: 13.3 g/dL (ref 12.0–15.0)
MCH: 31.4 pg (ref 26.0–34.0)
MCHC: 33.8 g/dL (ref 30.0–36.0)
MCV: 93.1 fL (ref 80.0–100.0)
Platelets: 484 10*3/uL — ABNORMAL HIGH (ref 150–400)
RBC: 4.23 MIL/uL (ref 3.87–5.11)
RDW: 13.1 % (ref 11.5–15.5)
WBC: 15.2 10*3/uL — ABNORMAL HIGH (ref 4.0–10.5)
nRBC: 0 % (ref 0.0–0.2)

## 2018-07-23 LAB — PREGNANCY, URINE: Preg Test, Ur: NEGATIVE

## 2018-07-23 SURGERY — LIGATION, FALLOPIAN TUBE, LAPAROSCOPIC
Anesthesia: General | Laterality: Bilateral

## 2018-07-23 MED ORDER — LACTATED RINGERS IV SOLN
INTRAVENOUS | Status: DC
  Administered 2018-07-23: 10:00:00 via INTRAVENOUS

## 2018-07-23 MED ORDER — MIDAZOLAM HCL 2 MG/2ML IJ SOLN
INTRAMUSCULAR | Status: DC | PRN
  Administered 2018-07-23: 2 mg via INTRAVENOUS

## 2018-07-23 MED ORDER — DEXAMETHASONE SODIUM PHOSPHATE 10 MG/ML IJ SOLN
INTRAMUSCULAR | Status: DC | PRN
  Administered 2018-07-23: 4 mg via INTRAVENOUS

## 2018-07-23 MED ORDER — LIDOCAINE HCL (CARDIAC) PF 100 MG/5ML IV SOSY
PREFILLED_SYRINGE | INTRAVENOUS | Status: DC | PRN
  Administered 2018-07-23: 100 mg via INTRAVENOUS

## 2018-07-23 MED ORDER — ROCURONIUM BROMIDE 100 MG/10ML IV SOLN
INTRAVENOUS | Status: DC | PRN
  Administered 2018-07-23: 50 mg via INTRAVENOUS

## 2018-07-23 MED ORDER — SCOPOLAMINE 1 MG/3DAYS TD PT72
1.0000 | MEDICATED_PATCH | Freq: Once | TRANSDERMAL | Status: DC
  Administered 2018-07-23: 1.5 mg via TRANSDERMAL

## 2018-07-23 MED ORDER — SOD CITRATE-CITRIC ACID 500-334 MG/5ML PO SOLN
30.0000 mL | ORAL | Status: AC
  Administered 2018-07-23: 30 mL via ORAL

## 2018-07-23 MED ORDER — KETOROLAC TROMETHAMINE 30 MG/ML IJ SOLN
INTRAMUSCULAR | Status: AC
  Filled 2018-07-23: qty 1

## 2018-07-23 MED ORDER — HYDROMORPHONE HCL 1 MG/ML IJ SOLN
INTRAMUSCULAR | Status: AC
  Filled 2018-07-23: qty 0.5

## 2018-07-23 MED ORDER — ROCURONIUM BROMIDE 100 MG/10ML IV SOLN
INTRAVENOUS | Status: AC
  Filled 2018-07-23: qty 1

## 2018-07-23 MED ORDER — MIDAZOLAM HCL 2 MG/2ML IJ SOLN
INTRAMUSCULAR | Status: AC
  Filled 2018-07-23: qty 2

## 2018-07-23 MED ORDER — PROPOFOL 10 MG/ML IV BOLUS
INTRAVENOUS | Status: DC | PRN
  Administered 2018-07-23: 150 mg via INTRAVENOUS

## 2018-07-23 MED ORDER — OXYCODONE HCL 5 MG PO TABS: 5.0000 mg | ORAL_TABLET | Freq: Once | ORAL | Status: DC | PRN

## 2018-07-23 MED ORDER — FENTANYL CITRATE (PF) 100 MCG/2ML IJ SOLN
INTRAMUSCULAR | Status: AC
  Filled 2018-07-23: qty 2

## 2018-07-23 MED ORDER — LACTATED RINGERS IV SOLN
INTRAVENOUS | Status: DC
  Administered 2018-07-23: 125 mL/h via INTRAVENOUS

## 2018-07-23 MED ORDER — IBUPROFEN 800 MG PO TABS: 800.0000 mg | ORAL_TABLET | Freq: Three times a day (TID) | ORAL | 0 refills | Status: DC | PRN

## 2018-07-23 MED ORDER — SOD CITRATE-CITRIC ACID 500-334 MG/5ML PO SOLN
ORAL | Status: AC
  Administered 2018-07-23: 30 mL via ORAL
  Filled 2018-07-23: qty 15

## 2018-07-23 MED ORDER — ONDANSETRON HCL 4 MG/2ML IJ SOLN
INTRAMUSCULAR | Status: AC
  Filled 2018-07-23: qty 2

## 2018-07-23 MED ORDER — ACETAMINOPHEN 10 MG/ML IV SOLN
1000.0000 mg | Freq: Once | INTRAVENOUS | Status: DC | PRN
  Administered 2018-07-23: 1000 mg via INTRAVENOUS

## 2018-07-23 MED ORDER — BUPIVACAINE HCL (PF) 0.5 % IJ SOLN
INTRAMUSCULAR | Status: AC
  Filled 2018-07-23: qty 30

## 2018-07-23 MED ORDER — SCOPOLAMINE 1 MG/3DAYS TD PT72
MEDICATED_PATCH | TRANSDERMAL | Status: AC
  Filled 2018-07-23: qty 1

## 2018-07-23 MED ORDER — ACETAMINOPHEN 10 MG/ML IV SOLN
INTRAVENOUS | Status: AC
  Filled 2018-07-23: qty 100

## 2018-07-23 MED ORDER — OXYCODONE HCL 5 MG/5ML PO SOLN: 5.0000 mg | Freq: Once | ORAL | Status: DC | PRN

## 2018-07-23 MED ORDER — PROMETHAZINE HCL 25 MG/ML IJ SOLN: 6.2500 mg | INTRAMUSCULAR | Status: DC | PRN

## 2018-07-23 MED ORDER — DEXAMETHASONE SODIUM PHOSPHATE 4 MG/ML IJ SOLN
INTRAMUSCULAR | Status: AC
  Filled 2018-07-23: qty 1

## 2018-07-23 MED ORDER — HYDROMORPHONE HCL 1 MG/ML IJ SOLN
0.5000 mg | INTRAMUSCULAR | Status: AC | PRN
  Administered 2018-07-23 (×2): 0.5 mg via INTRAVENOUS

## 2018-07-23 MED ORDER — PROPOFOL 10 MG/ML IV BOLUS
INTRAVENOUS | Status: AC
  Filled 2018-07-23: qty 20

## 2018-07-23 MED ORDER — FENTANYL CITRATE (PF) 100 MCG/2ML IJ SOLN
25.0000 ug | INTRAMUSCULAR | Status: DC | PRN
  Administered 2018-07-23 (×3): 50 ug via INTRAVENOUS

## 2018-07-23 MED ORDER — OXYCODONE HCL 5 MG PO TABS: 5.0000 mg | ORAL_TABLET | Freq: Four times a day (QID) | ORAL | 0 refills | Status: DC | PRN

## 2018-07-23 MED ORDER — SUGAMMADEX SODIUM 200 MG/2ML IV SOLN
INTRAVENOUS | Status: DC | PRN
  Administered 2018-07-23: 152.6 mg via INTRAVENOUS

## 2018-07-23 MED ORDER — LIDOCAINE HCL (CARDIAC) PF 100 MG/5ML IV SOSY
PREFILLED_SYRINGE | INTRAVENOUS | Status: AC
  Filled 2018-07-23: qty 5

## 2018-07-23 MED ORDER — FENTANYL CITRATE (PF) 100 MCG/2ML IJ SOLN
INTRAMUSCULAR | Status: DC | PRN
  Administered 2018-07-23: 100 ug via INTRAVENOUS
  Administered 2018-07-23 (×2): 25 ug via INTRAVENOUS
  Administered 2018-07-23: 50 ug via INTRAVENOUS

## 2018-07-23 MED ORDER — BUPIVACAINE HCL 0.5 % IJ SOLN
INTRAMUSCULAR | Status: DC | PRN
  Administered 2018-07-23: 10 mL

## 2018-07-23 MED ORDER — ONDANSETRON HCL 4 MG/2ML IJ SOLN
INTRAMUSCULAR | Status: DC | PRN
  Administered 2018-07-23: 4 mg via INTRAVENOUS

## 2018-07-23 MED ORDER — KETOROLAC TROMETHAMINE 30 MG/ML IJ SOLN
INTRAMUSCULAR | Status: DC | PRN
  Administered 2018-07-23: 30 mg via INTRAVENOUS

## 2018-07-23 SURGICAL SUPPLY — 26 items
ADH SKN CLS APL DERMABOND .7 (GAUZE/BANDAGES/DRESSINGS) ×1
CATH ROBINSON RED A/P 16FR (CATHETERS) ×3 IMPLANT
CLIP FILSHIE TUBAL LIGA STRL (Clip) ×4 IMPLANT
Cooper Surgical ×2 IMPLANT
DERMABOND ADVANCED (GAUZE/BANDAGES/DRESSINGS) ×2
DERMABOND ADVANCED .7 DNX12 (GAUZE/BANDAGES/DRESSINGS) ×1 IMPLANT
DRSG OPSITE POSTOP 3X4 (GAUZE/BANDAGES/DRESSINGS) ×2 IMPLANT
DURAPREP 26ML APPLICATOR (WOUND CARE) ×3 IMPLANT
GLOVE BIO SURGEON STRL SZ7.5 (GLOVE) ×3 IMPLANT
GLOVE BIOGEL PI IND STRL 7.0 (GLOVE) ×2 IMPLANT
GLOVE BIOGEL PI IND STRL 8 (GLOVE) ×1 IMPLANT
GLOVE BIOGEL PI INDICATOR 7.0 (GLOVE) ×4
GLOVE BIOGEL PI INDICATOR 8 (GLOVE) ×2
GOWN STRL REUS W/TWL LRG LVL3 (GOWN DISPOSABLE) ×3 IMPLANT
GOWN STRL REUS W/TWL XL LVL3 (GOWN DISPOSABLE) ×3 IMPLANT
NS IRRIG 1000ML POUR BTL (IV SOLUTION) ×3 IMPLANT
PACK LAPAROSCOPY BASIN (CUSTOM PROCEDURE TRAY) ×3 IMPLANT
PACK TRENDGUARD 450 HYBRID PRO (MISCELLANEOUS) IMPLANT
PROTECTOR NERVE ULNAR (MISCELLANEOUS) ×6 IMPLANT
SUT MNCRL AB 4-0 PS2 18 (SUTURE) ×3 IMPLANT
SUT VICRYL 0 UR6 27IN ABS (SUTURE) ×6 IMPLANT
TOWEL OR 17X24 6PK STRL BLUE (TOWEL DISPOSABLE) ×6 IMPLANT
TRENDGUARD 450 HYBRID PRO PACK (MISCELLANEOUS) ×3
TROCAR BALLN 12MMX100 BLUNT (TROCAR) ×3 IMPLANT
TUBING INSUF HEATED (TUBING) ×3 IMPLANT
WARMER LAPAROSCOPE (MISCELLANEOUS) ×3 IMPLANT

## 2018-07-23 NOTE — Op Note (Signed)
Brooke Carrillo 07/23/2018  PREOPERATIVE DIAGNOSIS:  Undesired fertility  POSTOPERATIVE DIAGNOSIS:  Undesired fertility  PROCEDURE:  Laparoscopic Bilateral Tubal Sterilization using Filshie Clips   SURGEON: Janika Jedlicka L. Alysia Penna, MD  ANESTHESIA:  General endotracheal  COMPLICATIONS:  None immediate.  ESTIMATED BLOOD LOSS:  Less than 20 ml.  FLUIDS: As recorded   URINE OUTPUT:  As recorded ml   INDICATIONS: 32 y.o. [id number]  with undesired fertility, desires permanent sterilization. Other reversible forms of contraception were discussed with patient; she declines all other modalities.  Risks of procedure discussed with patient including permanence of method, bleeding, infection, injury to surrounding organs and need for additional procedures including laparotomy, risk of regret.  Failure risk of 0.5-1% with increased risk of ectopic gestation if pregnancy occurs was also discussed with patient.      FINDINGS:  Normal uterus, tubes, and ovaries.  TECHNIQUE:  The patient was taken to the operating room where general anesthesia was obtained without difficulty.  She was then placed in the dorsal lithotomy position and prepared and draped in sterile fashion.  After an adequate timeout was performed, a bivalved speculum was then placed in the patient's vagina, and the anterior lip of cervix grasped with the single-tooth tenaculum.  The uterine manipulator was then advanced into the uterus.  The speculum was removed from the vagina.  Attention was then turned to the patient's abdomen where a 11-mm skin incision was made in the umbilical fold.  The fascia was identified and grasped and cut. Conners were secured with 0/ Vicryl. Peritoneum was identified and grasped and cut. entry was then gained into the abd cavity. A Huson trocar was placed and secured.  The abdomen was then insufflated with carbon dioxide gas and adequate pneumoperitoneum was obtained.  A survey of the patient's pelvis and abdomen revealed  entirely normal anatomy.  The fallopian tubes were observed and found to be normal in appearance. The Filshie clip applicator was placed through the operative port, and a Filshie clip was placed on the right fallopian tube ,about 2 cm from the cornual attachment, with care given to incorporate the underlying mesosalpinx.  A similar process was carried out on the contralateral side allowing for bilateral tubal sterilization.   Good hemostasis was noted overall.  Local analgesia was drizzled on both operative sites.The instruments were then removed from the patient's abdomen and the fascial incision was repaired with 0 Vicryl, and the skin was closed with Dermabond.  The uterine manipulator and the tenaculum were removed from the vagina without complications. The patient tolerated the procedure well.  Sponge, lap, and needle counts were correct times two.  The patient was then taken to the recovery room awake, extubated and in stable condition.

## 2018-07-23 NOTE — Anesthesia Postprocedure Evaluation (Signed)
Anesthesia Post Note  Patient: Brooke Carrillo  Procedure(s) Performed: LAPAROSCOPIC TUBAL LIGATION (Bilateral )     Patient location during evaluation: PACU Anesthesia Type: General Level of consciousness: awake and alert Pain management: pain level controlled Vital Signs Assessment: post-procedure vital signs reviewed and stable Respiratory status: spontaneous breathing, nonlabored ventilation and respiratory function stable Cardiovascular status: blood pressure returned to baseline and stable Postop Assessment: no apparent nausea or vomiting Anesthetic complications: no    Last Vitals:  Vitals:   07/23/18 1300 07/23/18 1333  BP: (!) 97/53 117/61  Pulse: (!) 51 69  Resp: 11 14  Temp:  36.7 C  SpO2: 98% 99%    Last Pain:  Vitals:   07/23/18 1333  TempSrc:   PainSc: 3    Pain Goal: Patients Stated Pain Goal: 3 (07/23/18 1333)               Kaylyn Layer

## 2018-07-23 NOTE — Transfer of Care (Signed)
Immediate Anesthesia Transfer of Care Note  Patient: Brooke Carrillo  Procedure(s) Performed: LAPAROSCOPIC TUBAL LIGATION (Bilateral )  Patient Location: PACU  Anesthesia Type:General  Level of Consciousness: awake, alert  and oriented  Airway & Oxygen Therapy: Patient Spontanous Breathing and Patient connected to nasal cannula oxygen  Post-op Assessment: Report given to RN and Post -op Vital signs reviewed and stable  Post vital signs: Reviewed and stable  Last Vitals:  Vitals Value Taken Time  BP    Temp    Pulse 80 07/23/2018 11:24 AM  Resp    SpO2 98 % 07/23/2018 11:24 AM  Vitals shown include unvalidated device data.  Last Pain:  Vitals:   07/23/18 0904  TempSrc: Oral  PainSc: 0-No pain      Patients Stated Pain Goal: 3 (07/23/18 0904)  Complications: No apparent anesthesia complications

## 2018-07-23 NOTE — Discharge Instructions (Signed)
Laparoscopic Surgery - Care After Laparoscopy is a surgical procedure. It is used to diagnose and treat diseases inside the belly(abdomen). It is usually a brief, common, and relatively simple procedure. The laparoscopeis a thin, lighted, pencil-sized instrument. It is like a telescope. It is inserted into your abdomen through a small cut (incision). Your caregiver can look at the organs inside your body through this instrument.  She can see if there is anything abnormal. Laparoscopy can be done either in a hospital or outpatient clinic. You may be given a mild sedative to help you relax before the procedure. Once in the operating room, you will be given a drug to make you sleep (general anesthesia). Laparoscopy usually lasts about 1 hour. After the procedure, you will be monitored in a recovery area until you are stable and doing well. Once you are home, it may take 3 to 7 days to fully recover.   Laparoscopy has relatively few risks. Your caregiver will discuss the risks with you before the procedure. Some problems that can occur include: RISKS AND COMPLICATIONS   Allergies to medicines.  Difficulty breathing.  Bleeding.  Infection.  Damage to other surrounding structures HOME CARE INSTRUCTIONS   Infection.  Bleeding.  Damage to other organs.  Anesthetic side effects.   Need for additional procedures such as open procedures/laparotomy PROCEDURE Once you receive anesthesia, your surgeon inflates the abdomen with a harmless gas (carbon dioxide). This makes the organs easier to see. The laparoscope is inserted into the abdomen through a small incision. This allows your surgeon to see into the abdomen. Other small instruments are also inserted into the abdomen through other small openings. Many surgeons attach a video camera to the laparoscope to enlarge the view. During a laparoscopy, the surgeon may be looking for inflammation, infection, or cancer.  The surgeon may also need to take  out certain organs or take tissue samples (biopsies). The specimens are sent to a specialist in looking at cells and tissue samples (pathologist). The pathologist examines them under a microscope to help to diagnose or confirm a disease. AFTER THE PROCEDURE   The incisions are closed with stitches (sutures) and Dermabond. Because these incisions are small (usually less than 1/2 inch), there is usually minimal discomfort after the procedure. There may also be discomfort from the instrument placement incisions in the abdomen. You will be given pain medicine to ease any discomfort.  You will rest in a recovery room for 1-2 hours until you are stable and doing well.  You may have some mild discomfort in the throat. This is from the tube placed in your throat while you were sleeping.  You may experience discomfort in the shoulder area from some trapped air between the liver and diaphragm. This sensation is normal and will slowly go away on its own.  The recovery time is shortened as long as there are no complications.  You will rest in a recovery room until stable and doing well. As long as there are no complications, you may be allowed to go home. Someone will need to drive you home and be with you for at least 24 hours once home. FINDING OUT THE RESULTS You will be called with the results of the pathology and will discuss these results with  your caregiver during your postoperative appointment. Do not assume everything is normal if you have not heard from your caregiver or the medical facility. It is important for you to follow up on all of your results. HOME   CARE INSTRUCTIONS   Take all medicines as directed.  Only take over-the-counter or prescription medicines for pain, discomfort, or fever as directed by your caregiver.  Resume daily activities as directed.  Showers are preferred over baths.  You may resume sexual activities in 1 week or as directed.  Do not drive while taking  narcotics. SEEK MEDICAL CARE IF:  There is increasing abdominal pain.  You feel lightheaded or faint.  You have the chills.  You have an oral temperature above 102 F (38.9 C).  There is pus-like (purulent) drainage from any of the wounds.  You are unable to pass gas or have a bowel movement.  You feel sick to your stomach (nauseous) or throw up (vomit). MAKE SURE YOU:   Understand these instructions.  Will watch your condition.  Will get help right away if you are not doing well or get worse.  ExitCare Patient Information 2013 [employer]   Post Anesthesia Home Care Instructions  Activity: Get plenty of rest for the remainder of the day. A responsible individual must stay with you for 24 hours following the procedure.  For the next 24 hours, DO NOT: -Drive a car -Operate machinery -Drink alcoholic beverages -Take any medication unless instructed by your physician -Make any legal decisions or sign important papers.  Meals: Start with liquid foods such as gelatin or soup. Progress to regular foods as tolerated. Avoid greasy, spicy, heavy foods. If nausea and/or vomiting occur, drink only clear liquids until the nausea and/or vomiting subsides. Call your physician if vomiting continues.  Special Instructions/Symptoms: Your throat may feel dry or sore from the anesthesia or the breathing tube placed in your throat during surgery. If this causes discomfort, gargle with warm salt water. The discomfort should disappear within 24 hours.  If you had a scopolamine patch placed behind your ear for the management of post- operative nausea and/or vomiting:  1. The medication in the patch is effective for 72 hours, after which it should be removed.  Wrap patch in a tissue and discard in the trash. Wash hands thoroughly with soap and water. 2. You may remove the patch earlier than 72 hours if you experience unpleasant side effects which may include dry mouth, dizziness or visual  disturbances. 3. Avoid touching the patch. Wash your hands with soap and water after contact with the patch. 

## 2018-07-23 NOTE — Interval H&P Note (Signed)
History and Physical Interval Note:  07/23/2018 9:58 AM  Brooke Carrillo  has presented today for surgery, with the diagnosis of Undesired Fertility  The various methods of treatment have been discussed with the patient and family. After consideration of risks, benefits and other options for treatment, the patient has consented to  Procedure(s): LAPAROSCOPIC TUBAL LIGATION (Bilateral) as a surgical intervention .  The patient's history has been reviewed, patient examined, no change in status, stable for surgery.  I have reviewed the patient's chart and labs.  Questions were answered to the patient's satisfaction.     Hermina Staggers

## 2018-07-23 NOTE — Anesthesia Preprocedure Evaluation (Addendum)
Anesthesia Evaluation  Patient identified by MRN, date of birth, ID band Patient awake    Reviewed: Allergy & Precautions, NPO status , Patient's Chart, lab work & pertinent test results  History of Anesthesia Complications Negative for: history of anesthetic complications  Airway Mallampati: II  TM Distance: >3 FB Neck ROM: Full    Dental  (+) Chipped, Dental Advisory Given,    Pulmonary Current Smoker,    breath sounds clear to auscultation       Cardiovascular negative cardio ROS   Rhythm:Regular Rate:Normal     Neuro/Psych negative neurological ROS  negative psych ROS   GI/Hepatic negative GI ROS, Neg liver ROS,   Endo/Other  negative endocrine ROS  Renal/GU negative Renal ROS     Musculoskeletal negative musculoskeletal ROS (+)   Abdominal   Peds  Hematology negative hematology ROS (+)   Anesthesia Other Findings Day of surgery medications reviewed with the patient.  Reproductive/Obstetrics negative OB ROS                            Anesthesia Physical Anesthesia Plan  ASA: II  Anesthesia Plan: General   Post-op Pain Management:    Induction: Intravenous  PONV Risk Score and Plan: 2 and Ondansetron, Dexamethasone, Treatment may vary due to age or medical condition and Midazolam  Airway Management Planned: Oral ETT  Additional Equipment:   Intra-op Plan:   Post-operative Plan: Extubation in OR  Informed Consent: I have reviewed the patients History and Physical, chart, labs and discussed the procedure including the risks, benefits and alternatives for the proposed anesthesia with the patient or authorized representative who has indicated his/her understanding and acceptance.   Dental advisory given  Plan Discussed with: CRNA and Surgeon  Anesthesia Plan Comments:        Anesthesia Quick Evaluation

## 2018-07-23 NOTE — Anesthesia Procedure Notes (Signed)
Procedure Name: Intubation Date/Time: 07/23/2018 10:21 AM Performed by: Genevie Ann, CRNA Pre-anesthesia Checklist: Patient identified, Emergency Drugs available, Suction available, Patient being monitored and Timeout performed Patient Re-evaluated:Patient Re-evaluated prior to induction Oxygen Delivery Method: Circle system utilized Preoxygenation: Pre-oxygenation with 100% oxygen Induction Type: IV induction Ventilation: Mask ventilation without difficulty Laryngoscope Size: Mac and 3 Grade View: Grade I Tube type: Oral Tube size: 7.0 mm Number of attempts: 1 Placement Confirmation: ETT inserted through vocal cords under direct vision,  positive ETCO2 and breath sounds checked- equal and bilateral Secured at: 22 cm Dental Injury: Teeth and Oropharynx as per pre-operative assessment

## 2018-07-26 ENCOUNTER — Encounter (HOSPITAL_COMMUNITY): Payer: Self-pay

## 2018-08-03 ENCOUNTER — Encounter (HOSPITAL_COMMUNITY): Payer: Self-pay | Admitting: Obstetrics and Gynecology

## 2020-08-24 ENCOUNTER — Ambulatory Visit (HOSPITAL_COMMUNITY)
Admission: EM | Admit: 2020-08-24 | Discharge: 2020-08-24 | Disposition: A | Discharge status: Home / Self Care | Payer: Medicaid Other | Attending: Family Medicine | Admitting: Family Medicine

## 2020-08-24 ENCOUNTER — Encounter (HOSPITAL_COMMUNITY): Payer: Self-pay | Admitting: *Deleted

## 2020-08-24 ENCOUNTER — Other Ambulatory Visit: Payer: Self-pay

## 2020-08-24 DIAGNOSIS — M5412 Radiculopathy, cervical region: Secondary | ICD-10-CM | POA: Diagnosis not present

## 2020-08-24 MED ORDER — HYDROCODONE-ACETAMINOPHEN 7.5-325 MG PO TABS: 1.0000 | ORAL_TABLET | Freq: Four times a day (QID) | ORAL | 0 refills | Status: DC | PRN

## 2020-08-24 MED ORDER — METHYLPREDNISOLONE SODIUM SUCC 125 MG IJ SOLR
INTRAMUSCULAR | Status: AC
  Filled 2020-08-24: qty 2

## 2020-08-24 MED ORDER — METHYLPREDNISOLONE 4 MG PO TBPK: ORAL_TABLET | ORAL | 0 refills | Status: DC

## 2020-08-24 MED ORDER — TIZANIDINE HCL 4 MG PO TABS
4.0000 mg | ORAL_TABLET | Freq: Four times a day (QID) | ORAL | 0 refills | Status: DC | PRN
Start: 2020-08-24 — End: 2020-08-28

## 2020-08-24 MED ORDER — METHYLPREDNISOLONE SODIUM SUCC 125 MG IJ SOLR
80.0000 mg | Freq: Once | INTRAMUSCULAR | Status: AC
  Administered 2020-08-24: 80 mg via INTRAMUSCULAR

## 2020-08-24 NOTE — ED Provider Notes (Signed)
[facility]    CSN: [id number] Arrival date & time: 08/24/20  1907      History   Chief Complaint Chief Complaint  Patient presents with  . Neck Pain  . Numbness    HPI Brooke Carrillo is a 34 y.o. female.   HPI Woke up this morning with numbness in right hand.  Has had progressive more numbness and pain in hand and now stiffness in neck throughout the day.  No history of neck problems.  No history of neck injury.  Usual activity yesterday excepted more cooking than usual (Thanksgiving).  No weakness. Past Medical History:  Diagnosis Date  . Headache   . Pregnancy as incidental finding     Patient Active Problem List   Diagnosis Date Noted  . Postpartum care and examination 06/01/2018  . Unwanted fertility 06/01/2018  . Contraception management 06/01/2018  . Vitamin D deficiency 10/21/2017  . Tobacco abuse 10/13/2017    Past Surgical History:  Procedure Laterality Date  . LAPAROSCOPIC TUBAL LIGATION Bilateral 07/23/2018   Procedure: LAPAROSCOPIC TUBAL LIGATION;  Surgeon: Hermina Staggers, MD;  Location: [facility];  Service: Gynecology;  Laterality: Bilateral;  . ORIF FINGER / THUMB FRACTURE    . WISDOM TOOTH EXTRACTION      OB History    Gravida  8   Para  4   Term  4   Preterm  0   AB  4   Living  4     SAB  3   TAB  1   Ectopic  0   Multiple  0   Live Births  4            Home Medications    Prior to Admission medications   Medication Sig Start Date End Date Taking? Authorizing Provider  Aspirin-Acetaminophen-Caffeine (EXCEDRIN PO) Take by mouth.   Yes [provider]  ibuprofen (ADVIL,MOTRIN) 800 MG tablet Take 1 tablet (800 mg total) by mouth every 8 (eight) hours as needed. 07/23/18  Yes Hermina Staggers, MD  HYDROcodone-acetaminophen (NORCO) 7.5-325 MG tablet Take 1 tablet by mouth every 6 (six) hours as needed for moderate pain. 08/24/20   Eustace Moore, MD  methylPREDNISolone (MEDROL DOSEPAK) 4 MG TBPK  tablet tad 08/24/20   Eustace Moore, MD  oxyCODONE (OXY IR/ROXICODONE) 5 MG immediate release tablet Take 1 tablet (5 mg total) by mouth every 6 (six) hours as needed for severe pain. 07/23/18   Hermina Staggers, MD  tiZANidine (ZANAFLEX) 4 MG tablet Take 1-2 tablets (4-8 mg total) by mouth every 6 (six) hours as needed for muscle spasms. 08/24/20   Eustace Moore, MD    Family History Family History  Problem Relation Age of Onset  . Hypertension Father   . Stroke Father   . Heart disease Father   . Dementia Mother   . Cancer Maternal Aunt        Breast  . HIV Maternal Aunt   . Stroke Maternal Uncle   . Cancer Paternal Uncle        Colon  . Cancer Maternal Grandmother   . Hypertension Paternal Grandmother   . Cancer Paternal Grandfather        Colon    Social History Social History   Tobacco Use  . Smoking status: Current Every Day Smoker    Packs/day: 0.50    Types: Cigarettes  . Smokeless tobacco: Never Used  Vaping Use  . Vaping Use: Former  Substance Use Topics  . Alcohol use: No  . Drug use: No     Allergies   Patient has no known allergies.   Review of Systems Review of Systems See HPI  Physical Exam Triage Vital Signs ED Triage Vitals [08/24/20 1917]  Enc Vitals Group     BP 127/81     Pulse Rate 66     Resp 12     Temp 98 F (36.7 C)     Temp Source Oral     SpO2 100 %     Weight      Height      Head Circumference      Peak Flow      Pain Score 5     Pain Loc      Pain Edu?      Excl. in GC?    No data found.  Updated Vital Signs BP 127/81   Pulse 66   Temp 98 F (36.7 C) (Oral)   Resp 12   LMP 08/22/2020 (Exact Date)   SpO2 100%   Breastfeeding No      Physical Exam Constitutional:      General: She is not in acute distress.    Appearance: She is well-developed.     Comments: Appears uncomfortable.  Stiff posture.  Guarded movements  HENT:     Head: Normocephalic and atraumatic.     Mouth/Throat:     Comments:  Mask is in place Eyes:     Conjunctiva/sclera: Conjunctivae normal.     Pupils: Pupils are equal, round, and reactive to light.  Neck:     Comments: Tenderness to the right of C 567 with increased muscle tone Cardiovascular:     Rate and Rhythm: Normal rate.  Pulmonary:     Effort: Pulmonary effort is normal. No respiratory distress.  Abdominal:     General: There is no distension.     Palpations: Abdomen is soft.  Musculoskeletal:        General: Normal range of motion.     Cervical back: Normal range of motion.  Skin:    General: Skin is warm and dry.  Neurological:     Mental Status: She is alert.     Sensory: No sensory deficit.     Motor: No weakness.     Deep Tendon Reflexes: Reflexes normal.  Psychiatric:        Behavior: Behavior normal.      UC Treatments / Results  Labs (all labs ordered are listed, but only abnormal results are displayed) Labs Reviewed - No data to display  EKG   Radiology No results found.  Procedures Procedures (including critical care time)  Medications Ordered in UC Medications  methylPREDNISolone sodium succinate (SOLU-MEDROL) 125 mg/2 mL injection 80 mg (80 mg Intramuscular Given 08/24/20 1946)    Initial Impression / Assessment and Plan / UC Course  I have reviewed the triage vital signs and the nursing notes.  Pertinent labs & imaging results that were available during my care of the patient were reviewed by me and considered in my medical decision making (see chart for details).      Final Clinical Impressions(s) / UC Diagnoses   Final diagnoses:  Cervical radiculopathy     Discharge Instructions     Take the medrol starting tomorrow Take the pain medicine and muscle relaxer as needed.  You can take the starting tonight. You got a shot of a cortisone medicine to start the anti-inflammatory process  Use ice or heat to area Gentle massage may help See your primary care doctor if not improving in a few days   ED  Prescriptions    Medication Sig Dispense Auth. Provider   methylPREDNISolone (MEDROL DOSEPAK) 4 MG TBPK tablet tad 21 tablet Eustace Moore, MD   tiZANidine (ZANAFLEX) 4 MG tablet Take 1-2 tablets (4-8 mg total) by mouth every 6 (six) hours as needed for muscle spasms. 21 tablet Eustace Moore, MD   HYDROcodone-acetaminophen [facility]) 7.5-325 MG tablet Take 1 tablet by mouth every 6 (six) hours as needed for moderate pain. 15 tablet Eustace Moore, MD     I have reviewed the PDMP during this encounter.   Eustace Moore, MD 08/24/20 1950

## 2020-08-24 NOTE — Discharge Instructions (Signed)
Take the medrol starting tomorrow Take the pain medicine and muscle relaxer as needed.  You can take the starting tonight. You got a shot of a cortisone medicine to start the anti-inflammatory process Use ice or heat to area Gentle massage may help See your primary care doctor if not improving in a few days

## 2020-08-24 NOTE — ED Triage Notes (Signed)
Denies injury.  C/O starting with cervical neck pain earlier today; a few hours ago was awakened from nap by tingling in right upper arm with numbness in right 1st-4th finger numbness.  All RUE fingers pink, warm, with prompt cap refill.  Pt moving RUE without difficulty.

## 2020-08-26 ENCOUNTER — Encounter (HOSPITAL_COMMUNITY): Payer: Self-pay | Admitting: Emergency Medicine

## 2020-08-26 ENCOUNTER — Other Ambulatory Visit: Payer: Self-pay

## 2020-08-26 ENCOUNTER — Inpatient Hospital Stay (HOSPITAL_COMMUNITY)
Admission: EM | Admit: 2020-08-26 | Discharge: 2020-08-28 | DRG: 419 | Disposition: A | Discharge status: Home / Self Care | Payer: Medicaid Other | Attending: Internal Medicine | Admitting: Internal Medicine

## 2020-08-26 DIAGNOSIS — Z20822 Contact with and (suspected) exposure to covid-19: Secondary | ICD-10-CM | POA: Diagnosis present

## 2020-08-26 DIAGNOSIS — K812 Acute cholecystitis with chronic cholecystitis: Principal | ICD-10-CM | POA: Diagnosis present

## 2020-08-26 DIAGNOSIS — K810 Acute cholecystitis: Secondary | ICD-10-CM

## 2020-08-26 DIAGNOSIS — Z79899 Other long term (current) drug therapy: Secondary | ICD-10-CM

## 2020-08-26 DIAGNOSIS — R1011 Right upper quadrant pain: Secondary | ICD-10-CM

## 2020-08-26 DIAGNOSIS — R001 Bradycardia, unspecified: Secondary | ICD-10-CM | POA: Diagnosis present

## 2020-08-26 DIAGNOSIS — F17210 Nicotine dependence, cigarettes, uncomplicated: Secondary | ICD-10-CM | POA: Diagnosis present

## 2020-08-26 LAB — COMPREHENSIVE METABOLIC PANEL
ALT: 14 U/L (ref 0–44)
AST: 16 U/L (ref 15–41)
Albumin: 3.9 g/dL (ref 3.5–5.0)
Alkaline Phosphatase: 54 U/L (ref 38–126)
Anion gap: 10 (ref 5–15)
BUN: 15 mg/dL (ref 6–20)
CO2: 22 mmol/L (ref 22–32)
Calcium: 9.8 mg/dL (ref 8.9–10.3)
Chloride: 110 mmol/L (ref 98–111)
Creatinine, Ser: 0.78 mg/dL (ref 0.44–1.00)
GFR, Estimated: >60 mL/min (ref >60)
Glucose, Bld: 107 mg/dL — ABNORMAL HIGH (ref 70–99)
Potassium: 3.9 mmol/L (ref 3.5–5.1)
Sodium: 142 mmol/L (ref 135–145)
Total Bilirubin: 0.6 mg/dL (ref 0.3–1.2)
Total Protein: 6.7 g/dL (ref 6.5–8.1)

## 2020-08-26 LAB — CBC
HCT: 38.4 % (ref 36.0–46.0)
Hemoglobin: 12.8 g/dL (ref 12.0–15.0)
MCH: 32.7 pg (ref 26.0–34.0)
MCHC: 33.3 g/dL (ref 30.0–36.0)
MCV: 98 fL (ref 80.0–100.0)
Platelets: 326 10*3/uL (ref 150–400)
RBC: 3.92 MIL/uL (ref 3.87–5.11)
RDW: 13.6 % (ref 11.5–15.5)
WBC: 15.2 10*3/uL — ABNORMAL HIGH (ref 4.0–10.5)
nRBC: 0 % (ref 0.0–0.2)

## 2020-08-26 LAB — I-STAT BETA HCG BLOOD, ED (MC, WL, AP ONLY): I-stat hCG, quantitative: 7.5 m[IU]/mL — ABNORMAL HIGH (ref <5)

## 2020-08-26 LAB — URINALYSIS, ROUTINE W REFLEX MICROSCOPIC
Bilirubin Urine: NEGATIVE
Glucose, UA: NEGATIVE mg/dL
Hgb urine dipstick: NEGATIVE
Ketones, ur: 20 mg/dL — AB
Leukocytes,Ua: NEGATIVE
Nitrite: NEGATIVE
Protein, ur: NEGATIVE mg/dL
Specific Gravity, Urine: 1.029 (ref 1.005–1.030)
pH: 5 (ref 5.0–8.0)

## 2020-08-26 LAB — PREGNANCY, URINE: Preg Test, Ur: NEGATIVE

## 2020-08-26 LAB — LIPASE, BLOOD: Lipase: 19 U/L (ref 11–51)

## 2020-08-26 MED ORDER — SODIUM CHLORIDE 0.9 % IV BOLUS
1000.0000 mL | Freq: Once | INTRAVENOUS | Status: AC
  Administered 2020-08-27: 1000 mL via INTRAVENOUS

## 2020-08-26 MED ORDER — MORPHINE SULFATE (PF) 4 MG/ML IV SOLN
4.0000 mg | Freq: Once | INTRAVENOUS | Status: AC
  Administered 2020-08-27: 4 mg via INTRAVENOUS
  Filled 2020-08-26: qty 1

## 2020-08-26 MED ORDER — ONDANSETRON HCL 4 MG/2ML IJ SOLN
4.0000 mg | Freq: Once | INTRAMUSCULAR | Status: AC
  Administered 2020-08-27: 4 mg via INTRAVENOUS
  Filled 2020-08-26: qty 2

## 2020-08-26 NOTE — ED Provider Notes (Signed)
[facility] EMERGENCY DEPARTMENT Provider Note   CSN: [id number] Arrival date & time: 08/26/20  1704     History Chief Complaint  Patient presents with  . Abdominal Pain    Brooke Carrillo is a 34 y.o. female with a history of tubal ligation who presents to the emergency department with a chief complaint of abdominal pain.  The patient reports that 48 hours ago that she began having some right-sided paresthesias and numbness in her right arm.  It she was seen and evaluated at urgent care and was diagnosed with a pinched nerve and started on a methylprednisolone Dosepak, Norco, and Zanaflex, which she has been taking as prescribed.  Symptoms have persisted but have gradually improved since onset.  She reports that she woke yesterday morning with upper abdominal and flank pain that has been constant since onset.  She characterizes the pain as stabbing and nonradiating.  Pain is worse with movement.  Pain is not pleuritic or associated with eating.  No other known aggravating or alleviating factors.  She reports associated nausea and has had a poor appetite.  No fever, chills, vomiting, diarrhea, constipation, dysuria, hematuria, vaginal discharge, cough, shortness of breath, chest pain, back pain.  No history of similar.  No history of gallstones or kidney stones.  No known sick contacts.  The history is provided by the patient and medical records. No language interpreter was used.       Past Medical History:  Diagnosis Date  . Headache   . Pregnancy as incidental finding     Patient Active Problem List   Diagnosis Date Noted  . Acute cholecystitis 08/27/2020  . Sinus bradycardia 08/27/2020  . Postpartum care and examination 06/01/2018  . Unwanted fertility 06/01/2018  . Contraception management 06/01/2018  . Vitamin D deficiency 10/21/2017  . Tobacco abuse 10/13/2017    Past Surgical History:  Procedure Laterality Date  . LAPAROSCOPIC TUBAL LIGATION  Bilateral 07/23/2018   Procedure: LAPAROSCOPIC TUBAL LIGATION;  Surgeon: Hermina Staggers, MD;  Location: [facility];  Service: Gynecology;  Laterality: Bilateral;  . ORIF FINGER / THUMB FRACTURE    . WISDOM TOOTH EXTRACTION       OB History    Gravida  8   Para  4   Term  4   Preterm  0   AB  4   Living  4     SAB  3   TAB  1   Ectopic  0   Multiple  0   Live Births  4           Family History  Problem Relation Age of Onset  . Hypertension Father   . Stroke Father   . Heart disease Father   . Dementia Mother   . Cancer Maternal Aunt        Breast  . HIV Maternal Aunt   . Stroke Maternal Uncle   . Cancer Paternal Uncle        Colon  . Cancer Maternal Grandmother   . Hypertension Paternal Grandmother   . Cancer Paternal Grandfather        Colon    Social History   Tobacco Use  . Smoking status: Current Every Day Smoker    Packs/day: 0.50    Types: Cigarettes  . Smokeless tobacco: Never Used  Vaping Use  . Vaping Use: Former  Substance Use Topics  . Alcohol use: No  . Drug use: No    Home Medications  Prior to Admission medications   Medication Sig Start Date End Date Taking? Authorizing Provider  aspirin-acetaminophen-caffeine (EXCEDRIN MIGRAINE) [phone] MG tablet Take 2 tablets by mouth every 6 (six) hours as needed for headache.   Yes [provider]  HYDROcodone-acetaminophen (NORCO) 7.5-325 MG tablet Take 1 tablet by mouth every 6 (six) hours as needed for moderate pain. 08/24/20  Yes Eustace Moore, MD  ibuprofen (ADVIL) 200 MG tablet Take 400 mg by mouth every 6 (six) hours as needed for headache (pain).   Yes [provider]  methylPREDNISolone (MEDROL DOSEPAK) 4 MG TBPK tablet tad Patient taking differently: Take 4 mg by mouth See admin instructions. 6 day dose pak - take one tablet (4 mg) by mouth per package directions 08/24/20  Yes Eustace Moore, MD  tiZANidine (ZANAFLEX) 4 MG tablet Take 1-2 tablets (4-8 mg  total) by mouth every 6 (six) hours as needed for muscle spasms. 08/24/20  Yes Eustace Moore, MD    Allergies    Patient has no known allergies.  Review of Systems   Review of Systems  Constitutional: Positive for appetite change. Negative for activity change, chills and fever.  HENT: Negative for congestion, sinus pressure, sinus pain and sore throat.   Respiratory: Negative for cough, shortness of breath and wheezing.   Cardiovascular: Negative for chest pain, palpitations and leg swelling.  Gastrointestinal: Positive for abdominal pain and nausea. Negative for blood in stool, constipation, diarrhea, rectal pain and vomiting.  Genitourinary: Positive for flank pain. Negative for decreased urine volume, difficulty urinating, dysuria, frequency, genital sores, menstrual problem and pelvic pain.  Musculoskeletal: Positive for neck pain. Negative for arthralgias, back pain, myalgias and neck stiffness.  Skin: Negative for rash.  Allergic/Immunologic: Negative for immunocompromised state.  Neurological: Positive for numbness. Negative for dizziness, seizures, syncope, speech difficulty, weakness, light-headedness and headaches.       Paresthesias  Psychiatric/Behavioral: Negative for confusion.    Physical Exam Updated Vital Signs BP (!) 107/57 (BP Location: Right Arm)   Pulse (!) 43   Temp 98.1 F (36.7 C) (Oral)   Resp 17   Ht 5\' 3"  (1.6 m)   Wt 77.1 kg   LMP 08/22/2020 (Exact Date)   SpO2 100%   BMI 30.11 kg/m   Physical Exam Vitals and nursing note reviewed.  Constitutional:      General: She is not in acute distress.    Appearance: Normal appearance. She is not ill-appearing, toxic-appearing or diaphoretic.  HENT:     Head: Normocephalic.  Eyes:     Conjunctiva/sclera: Conjunctivae normal.  Cardiovascular:     Rate and Rhythm: Regular rhythm. Bradycardia present.     Pulses: Normal pulses.     Heart sounds: Normal heart sounds. No murmur heard.  No friction  rub. No gallop.   Pulmonary:     Effort: Pulmonary effort is normal. No respiratory distress.     Breath sounds: No stridor. No wheezing, rhonchi or rales.  Chest:     Chest wall: No tenderness.  Abdominal:     General: There is no distension.     Palpations: Abdomen is soft. There is no mass.     Tenderness: There is no abdominal tenderness. There is no right CVA tenderness, left CVA tenderness, guarding or rebound.     Hernia: No hernia is present.     Comments: Tender to palpation in the epigastric and right upper quadrant.  There is right CVA tenderness.  No left CVA tenderness.  Hyperactive bowel sounds in all 4 quadrants.  No rebound or guarding.  She has a positive Murphy sign.  No tenderness over McBurney's point.   Musculoskeletal:        General: No tenderness.     Cervical back: Neck supple.     Right lower leg: No edema.     Left lower leg: No edema.  Skin:    General: Skin is warm.     Findings: No rash.  Neurological:     Mental Status: She is alert.  Psychiatric:        Behavior: Behavior normal.     ED Results / Procedures / Treatments   Labs (all labs ordered are listed, but only abnormal results are displayed) Labs Reviewed  COMPREHENSIVE METABOLIC PANEL - Abnormal; Notable for the following components:      Result Value   Glucose, Bld 107 (*)    All other components within normal limits  CBC - Abnormal; Notable for the following components:   WBC 15.2 (*)    All other components within normal limits  URINALYSIS, ROUTINE W REFLEX MICROSCOPIC - Abnormal; Notable for the following components:   APPearance HAZY (*)    Ketones, ur 20 (*)    All other components within normal limits  I-STAT BETA HCG BLOOD, ED ([facility], [facility], [facility] ONLY) - Abnormal; Notable for the following components:   I-stat hCG, quantitative 7.5 (*)    All other components within normal limits  RESP PANEL BY RT-PCR (FLU A&B, COVID) ARPGX2  LIPASE, BLOOD  PREGNANCY, URINE  HIV ANTIBODY (ROUTINE  TESTING W REFLEX)  TSH  CBC WITH DIFFERENTIAL/PLATELET  COMPREHENSIVE METABOLIC PANEL  SURGICAL PATHOLOGY  TROPONIN I (HIGH SENSITIVITY)    EKG EKG Interpretation  Date/Time:  Monday August 27 2020 02:45:18 EST Ventricular Rate:  46 PR Interval:    QRS Duration: 104 QT Interval:  497 QTC Calculation: 435 R Axis:   64 Text Interpretation: Sinus bradycardia Probable left ventricular hypertrophy No significant change since last tracing Confirmed by Ward, Baxter Hire [id number]) on 08/27/2020 2:47:27 AM   Radiology CT ABDOMEN PELVIS W CONTRAST  Result Date: 08/27/2020 CLINICAL DATA:  Right flank pain EXAM: CT ABDOMEN AND PELVIS WITH CONTRAST TECHNIQUE: Multidetector CT imaging of the abdomen and pelvis was performed using the standard protocol following bolus administration of intravenous contrast. CONTRAST:  OMNIPAQUE IOHEXOL 300 MG/ML  SOLN COMPARISON:  07/11/2015 FINDINGS: LOWER CHEST: Normal. HEPATOBILIARY: Normal hepatic contours. No intra- or extrahepatic biliary dilatation. Distended gallbladder with moderate wall thickening. PANCREAS: Normal pancreas. No ductal dilatation or peripancreatic fluid collection. SPLEEN: Normal. ADRENALS/URINARY TRACT: The adrenal glands are normal. No hydronephrosis, nephroureterolithiasis or solid renal mass. The urinary bladder is normal for degree of distention STOMACH/BOWEL: There is no hiatal hernia. Normal duodenal course and caliber. No small bowel dilatation or inflammation. No focal colonic abnormality. Normal appendix. VASCULAR/LYMPHATIC: Normal course and caliber of the major abdominal vessels. No abdominal or pelvic lymphadenopathy. REPRODUCTIVE: Normal uterus. No adnexal mass. Small amount of fluid in the pelvis. MUSCULOSKELETAL. No bony spinal canal stenosis or focal osseous abnormality. OTHER: None. IMPRESSION: Distended gallbladder with moderate wall thickening, possibly indicating acute cholecystitis. Electronically Signed   By: Deatra Robinson  M.D.   On: 08/27/2020 00:48   US Abdomen Limited RUQ (LIVER/GB)  Result Date: 08/27/2020 CLINICAL DATA:  Right upper quadrant pain EXAM: ULTRASOUND ABDOMEN LIMITED RIGHT UPPER QUADRANT COMPARISON:  CT from earlier in the same day. FINDINGS: Gallbladder: Gallbladder is well distended. Wall is thickened  at 7.7 mm. Pericholecystic fluid is noted as well. These changes are highly suggestive of acute cholecystitis despite the lack of gallstones or positive sonographic Murphy's sign. Patient had been medicated prior to the ultrasound exam. Common bile duct: Diameter: 3.1 mm. Liver: No focal lesion identified. Within normal limits in parenchymal echogenicity. Portal vein is patent on color Doppler imaging with normal direction of blood flow towards the liver. Other: None. IMPRESSION: Changes consistent with acute cholecystitis. Electronically Signed   By: Alcide CleverMark  Lukens M.D.   On: 08/27/2020 02:21    Procedures Procedures (including critical care time)  Medications Ordered in ED Medications  dextrose 5 %-0.9 % sodium chloride infusion ( Intravenous New Bag/Given 08/27/20 1703)  morphine 2 MG/ML injection 1-4 mg (4 mg Intravenous Given 08/27/20 1526)  ondansetron (ZOFRAN) injection 4 mg (has no administration in time range)  oxyCODONE (Oxy IR/ROXICODONE) immediate release tablet 5 mg (5 mg Oral Given 08/28/20 0108)  acetaminophen (TYLENOL) tablet 1,000 mg (1,000 mg Oral Given 08/28/20 0108)  morphine 4 MG/ML injection 4 mg (4 mg Intravenous Given 08/27/20 0001)  sodium chloride 0.9 % bolus 1,000 mL (0 mLs Intravenous Stopped 08/27/20 0304)  ondansetron (ZOFRAN) injection 4 mg (4 mg Intravenous Given 08/27/20 0001)  iohexol (OMNIPAQUE) 300 MG/ML solution 100 mL (100 mLs Intravenous Contrast Given 08/27/20 0023)  cefTRIAXone (ROCEPHIN) 2 g in sodium chloride 0.9 % 100 mL IVPB (0 g Intravenous Stopped 08/27/20 0359)  morphine 4 MG/ML injection 4 mg (4 mg Intravenous Given 08/27/20 0304)   piperacillin-tazobactam (ZOSYN) IVPB 3.375 g (0 g Intravenous Stopped 08/27/20 0453)  acetaminophen (TYLENOL) tablet 1,000 mg (1,000 mg Oral Given 08/27/20 0850)  celecoxib (CELEBREX) capsule 400 mg (400 mg Oral Given 08/27/20 0850)  gabapentin (NEURONTIN) capsule 300 mg (300 mg Oral Given 08/27/20 0851)  chlorhexidine (PERIDEX) 0.12 % solution 15 mL (15 mLs Mouth/Throat Given 08/27/20 0851)  acetaminophen (TYLENOL) tablet 1,000 mg (1,000 mg Oral Given 08/27/20 1353)    ED Course  I have reviewed the triage vital signs and the nursing notes.  Pertinent labs & imaging results that were available during my care of the patient were reviewed by me and considered in my medical decision making (see chart for details).  Clinical Course as of Aug 29 251  Mon Aug 27, 2020  [id number] Patient noted to be braiding down into the upper 30s on cardiac monitor.  Repeat EKG without evidence of heart block.  On reevaluation the patient, she continues to remain asymptomatic.  Her only complaint is right upper quadrant abdominal pain at this time.  Will reduce pain medication.   [MM]    Clinical Course User Index [MM] Keven Soucy, Coral ElseMia A, PA-C   MDM Rules/Calculators/A&P                          This is a 34 y.o. female with a history of tubal ligation presenting with upper abdominal pain, nausea, and anorexia.   This involves an extensive number of treatment options, and is a complaint that carries with it a high risk of complications and morbidity.  The differential diagnosis includes gastroenteritis, pancreatitis, cholecystitis, ascending cholangitis, pyelonephritis, UTI, PID, appendicitis, obstructive uropathy, mesenteric ischemia, bowel obstruction.   Patient was discussed with Dr. Elesa MassedWard, attending physician.  Vitals and Exam:    Bradycardic into the low 40s.  Patient is asymptomatic.  Vitals are otherwise stable.   Lab Tests:    I ordered, reviewed, and interpreted labs, which included  CBC,  metabolic panel, lipase, urinalysis, COVID-19 that showed a leukocytosis of 15.2.  This could be secondary to infection, but patient was also recently started on a corticosteroid taper and could be reactive.  Hepatic function panel testing is not elevated.  COVID-19 test is negative.  Imaging Studies ordered:    I ordered imaging studies which included  CT abdomen pelvis and right upper quadrant ultrasound  I independently visualized and interpreted imaging which showed distention of the gallbladder with moderate wall thickening that is suggestive of acute cholecystitis on CT.  Right upper quadrant ultrasound with gallbladder wall thickening and pericholecystic fluid suggestive of acute cholecystitis   Additional history obtained:    Previous records obtained and reviewed  Medicines ordered:    I ordered Rocephin based on low risk cholecystitis ED antibiotic order set.  She was given morphine for pain control and Zofran for nausea and vomiting.   Consultations Obtained:    I consulted  hospitalist team and Dr. Toniann Fail has accepted the patient for admission.   Reevaluation:   After the interventions stated above, I reevaluated the patient and found improved  Plan and Disposition:   Dr. Toniann Fail will accept the patient for admission for acute cholecystitis as the patient is also bradycardic.  She has had multiple repeat EKGs with no evidence of heart block.  Patient is asymptomatic.  The patient has been discussed with Dr. Elesa Massed, attending physician.  The patient appears reasonably stabilized for admission considering the current resources, flow, and capabilities available in the ED at this time, and I doubt any other [facility] requiring further screening and/or treatment in the ED prior to admission.    Final Clinical Impression(s) / ED Diagnoses Final diagnoses:  RUQ abdominal pain  Acute cholecystitis  Bradycardia    Rx / DC Orders ED Discharge Orders    None        Gryffin Altice A, PA-C 08/28/20 0252    Ward, Layla Maw, DO 09/05/20 2302

## 2020-08-26 NOTE — ED Triage Notes (Signed)
Pt reports R sided abd pain with nausea since yesterday.  Denies vomiting and diarrhea. Also reports R arm numbness and tingling and was recently seen at [facility] and diagnosed with pinched nerve.

## 2020-08-27 ENCOUNTER — Emergency Department (HOSPITAL_COMMUNITY): Payer: Medicaid Other

## 2020-08-27 ENCOUNTER — Inpatient Hospital Stay (HOSPITAL_COMMUNITY): Payer: Medicaid Other | Admitting: Anesthesiology

## 2020-08-27 ENCOUNTER — Encounter (HOSPITAL_COMMUNITY): Payer: Self-pay | Admitting: Internal Medicine

## 2020-08-27 ENCOUNTER — Other Ambulatory Visit: Payer: Self-pay

## 2020-08-27 ENCOUNTER — Encounter (HOSPITAL_COMMUNITY)
Admission: EM | Disposition: A | Discharge status: Home / Self Care | Payer: Self-pay | Source: Home / Self Care | Attending: Internal Medicine

## 2020-08-27 DIAGNOSIS — F17210 Nicotine dependence, cigarettes, uncomplicated: Secondary | ICD-10-CM | POA: Diagnosis not present

## 2020-08-27 DIAGNOSIS — Z79899 Other long term (current) drug therapy: Secondary | ICD-10-CM | POA: Diagnosis not present

## 2020-08-27 DIAGNOSIS — K810 Acute cholecystitis: Secondary | ICD-10-CM | POA: Diagnosis present

## 2020-08-27 DIAGNOSIS — R001 Bradycardia, unspecified: Secondary | ICD-10-CM

## 2020-08-27 DIAGNOSIS — Z20822 Contact with and (suspected) exposure to covid-19: Secondary | ICD-10-CM | POA: Diagnosis not present

## 2020-08-27 DIAGNOSIS — K812 Acute cholecystitis with chronic cholecystitis: Secondary | ICD-10-CM | POA: Diagnosis not present

## 2020-08-27 HISTORY — PX: CHOLECYSTECTOMY: SHX55

## 2020-08-27 LAB — HIV ANTIBODY (ROUTINE TESTING W REFLEX): HIV Screen 4th Generation wRfx: NONREACTIVE

## 2020-08-27 LAB — RESP PANEL BY RT-PCR (FLU A&B, COVID) ARPGX2
Influenza A by PCR: NEGATIVE
Influenza B by PCR: NEGATIVE
SARS Coronavirus 2 by RT PCR: NEGATIVE

## 2020-08-27 LAB — TSH: TSH: 0.483 u[IU]/mL (ref 0.350–4.500)

## 2020-08-27 LAB — TROPONIN I (HIGH SENSITIVITY): Troponin I (High Sensitivity): 6 ng/L (ref <18)

## 2020-08-27 SURGERY — LAPAROSCOPIC CHOLECYSTECTOMY
Anesthesia: General | Site: Abdomen

## 2020-08-27 MED ORDER — ACETAMINOPHEN 160 MG/5ML PO SOLN: 1000.0000 mg | Freq: Once | ORAL | Status: DC | PRN

## 2020-08-27 MED ORDER — CHLORHEXIDINE GLUCONATE 0.12 % MT SOLN: 15.0000 mL | Freq: Once | OROMUCOSAL | Status: AC

## 2020-08-27 MED ORDER — ACETAMINOPHEN 10 MG/ML IV SOLN: 1000.0000 mg | Freq: Once | INTRAVENOUS | Status: DC | PRN

## 2020-08-27 MED ORDER — FENTANYL CITRATE (PF) 250 MCG/5ML IJ SOLN
INTRAMUSCULAR | Status: DC | PRN
  Administered 2020-08-27: 50 ug via INTRAVENOUS
  Administered 2020-08-27: 100 ug via INTRAVENOUS
  Administered 2020-08-27 (×5): 50 ug via INTRAVENOUS
  Administered 2020-08-27: 100 ug via INTRAVENOUS

## 2020-08-27 MED ORDER — CELECOXIB 200 MG PO CAPS: 400.0000 mg | ORAL_CAPSULE | ORAL | Status: AC

## 2020-08-27 MED ORDER — BUPIVACAINE HCL 0.25 % IJ SOLN
INTRAMUSCULAR | Status: DC | PRN
  Administered 2020-08-27: 11 mL

## 2020-08-27 MED ORDER — ACETAMINOPHEN 500 MG PO TABS
ORAL_TABLET | ORAL | Status: AC
  Administered 2020-08-27: 1000 mg via ORAL
  Filled 2020-08-27: qty 2

## 2020-08-27 MED ORDER — ONDANSETRON HCL 4 MG/2ML IJ SOLN
INTRAMUSCULAR | Status: DC | PRN
  Administered 2020-08-27: 4 mg via INTRAVENOUS

## 2020-08-27 MED ORDER — DEXAMETHASONE SODIUM PHOSPHATE 10 MG/ML IJ SOLN
INTRAMUSCULAR | Status: DC | PRN
  Administered 2020-08-27: 10 mg via INTRAVENOUS

## 2020-08-27 MED ORDER — MORPHINE SULFATE (PF) 4 MG/ML IV SOLN
4.0000 mg | Freq: Once | INTRAVENOUS | Status: AC
  Administered 2020-08-27: 4 mg via INTRAVENOUS
  Filled 2020-08-27: qty 1

## 2020-08-27 MED ORDER — PIPERACILLIN-TAZOBACTAM 3.375 G IVPB 30 MIN
3.3750 g | INTRAVENOUS | Status: AC
  Administered 2020-08-27: 3.375 g via INTRAVENOUS
  Filled 2020-08-27: qty 50

## 2020-08-27 MED ORDER — ROCURONIUM BROMIDE 10 MG/ML (PF) SYRINGE
PREFILLED_SYRINGE | INTRAVENOUS | Status: AC
  Filled 2020-08-27: qty 10

## 2020-08-27 MED ORDER — 0.9 % SODIUM CHLORIDE (POUR BTL) OPTIME
TOPICAL | Status: DC | PRN
  Administered 2020-08-27: 1000 mL

## 2020-08-27 MED ORDER — MIDAZOLAM HCL 2 MG/2ML IJ SOLN
INTRAMUSCULAR | Status: AC
  Filled 2020-08-27: qty 2

## 2020-08-27 MED ORDER — ONDANSETRON HCL 4 MG/2ML IJ SOLN
INTRAMUSCULAR | Status: AC
  Filled 2020-08-27: qty 2

## 2020-08-27 MED ORDER — ACETAMINOPHEN 500 MG PO TABS: 1000.0000 mg | ORAL_TABLET | Freq: Once | ORAL | Status: DC | PRN

## 2020-08-27 MED ORDER — OXYCODONE HCL 5 MG PO TABS
5.0000 mg | ORAL_TABLET | ORAL | Status: DC | PRN
  Administered 2020-08-27 – 2020-08-28 (×6): 5 mg via ORAL
  Filled 2020-08-27 (×7): qty 1

## 2020-08-27 MED ORDER — GABAPENTIN 300 MG PO CAPS: 300.0000 mg | ORAL_CAPSULE | ORAL | Status: AC

## 2020-08-27 MED ORDER — ONDANSETRON HCL 4 MG/2ML IJ SOLN: 4.0000 mg | Freq: Four times a day (QID) | INTRAMUSCULAR | Status: DC | PRN

## 2020-08-27 MED ORDER — MORPHINE SULFATE (PF) 2 MG/ML IV SOLN
1.0000 mg | INTRAVENOUS | Status: DC | PRN
  Administered 2020-08-27: 1 mg via INTRAVENOUS
  Filled 2020-08-27: qty 1

## 2020-08-27 MED ORDER — BUPIVACAINE HCL (PF) 0.25 % IJ SOLN
INTRAMUSCULAR | Status: AC
  Filled 2020-08-27: qty 30

## 2020-08-27 MED ORDER — MORPHINE SULFATE (PF) 2 MG/ML IV SOLN
1.0000 mg | INTRAVENOUS | Status: DC | PRN
  Administered 2020-08-27: 4 mg via INTRAVENOUS
  Administered 2020-08-27 – 2020-08-28 (×3): 2 mg via INTRAVENOUS
  Filled 2020-08-27 (×3): qty 1
  Filled 2020-08-27: qty 2

## 2020-08-27 MED ORDER — DEXTROSE-NACL 5-0.9 % IV SOLN: INTRAVENOUS | Status: AC

## 2020-08-27 MED ORDER — IOHEXOL 300 MG/ML  SOLN
100.0000 mL | Freq: Once | INTRAMUSCULAR | Status: AC | PRN
  Administered 2020-08-27: 100 mL via INTRAVENOUS

## 2020-08-27 MED ORDER — HYDROMORPHONE HCL 1 MG/ML IJ SOLN
INTRAMUSCULAR | Status: AC
  Filled 2020-08-27: qty 0.5

## 2020-08-27 MED ORDER — CELECOXIB 200 MG PO CAPS
ORAL_CAPSULE | ORAL | Status: AC
  Administered 2020-08-27: 400 mg via ORAL
  Filled 2020-08-27: qty 2

## 2020-08-27 MED ORDER — SODIUM CHLORIDE 0.9 % IV SOLN
2.0000 g | Freq: Once | INTRAVENOUS | Status: AC
  Administered 2020-08-27: 2 g via INTRAVENOUS
  Filled 2020-08-27: qty 20

## 2020-08-27 MED ORDER — OXYCODONE HCL 5 MG PO TABS: 5.0000 mg | ORAL_TABLET | Freq: Once | ORAL | Status: DC | PRN

## 2020-08-27 MED ORDER — ACETAMINOPHEN 500 MG PO TABS: 1000.0000 mg | ORAL_TABLET | ORAL | Status: AC

## 2020-08-27 MED ORDER — ROCURONIUM BROMIDE 10 MG/ML (PF) SYRINGE
PREFILLED_SYRINGE | INTRAVENOUS | Status: DC | PRN
  Administered 2020-08-27: 70 mg via INTRAVENOUS

## 2020-08-27 MED ORDER — SUGAMMADEX SODIUM 200 MG/2ML IV SOLN
INTRAVENOUS | Status: DC | PRN
  Administered 2020-08-27: 200 mg via INTRAVENOUS

## 2020-08-27 MED ORDER — STERILE WATER FOR IRRIGATION IR SOLN
Status: DC | PRN
  Administered 2020-08-27: 1000 mL

## 2020-08-27 MED ORDER — GABAPENTIN 300 MG PO CAPS
ORAL_CAPSULE | ORAL | Status: AC
  Administered 2020-08-27: 300 mg via ORAL
  Filled 2020-08-27: qty 1

## 2020-08-27 MED ORDER — FENTANYL CITRATE (PF) 100 MCG/2ML IJ SOLN: 25.0000 ug | INTRAMUSCULAR | Status: DC | PRN

## 2020-08-27 MED ORDER — ENSURE PRE-SURGERY PO LIQD: 296.0000 mL | Freq: Once | ORAL | Status: DC

## 2020-08-27 MED ORDER — DEXAMETHASONE SODIUM PHOSPHATE 10 MG/ML IJ SOLN
INTRAMUSCULAR | Status: AC
  Filled 2020-08-27: qty 1

## 2020-08-27 MED ORDER — PIPERACILLIN-TAZOBACTAM 3.375 G IVPB: 3.3750 g | Freq: Three times a day (TID) | INTRAVENOUS | Status: DC

## 2020-08-27 MED ORDER — HEMOSTATIC AGENTS (NO CHARGE) OPTIME
TOPICAL | Status: DC | PRN
  Administered 2020-08-27 (×3): 1 via TOPICAL

## 2020-08-27 MED ORDER — OXYCODONE HCL 5 MG/5ML PO SOLN: 5.0000 mg | Freq: Once | ORAL | Status: DC | PRN

## 2020-08-27 MED ORDER — FENTANYL CITRATE (PF) 250 MCG/5ML IJ SOLN
INTRAMUSCULAR | Status: AC
  Filled 2020-08-27: qty 5

## 2020-08-27 MED ORDER — PROPOFOL 10 MG/ML IV BOLUS
INTRAVENOUS | Status: AC
  Filled 2020-08-27: qty 20

## 2020-08-27 MED ORDER — ACETAMINOPHEN 500 MG PO TABS
1000.0000 mg | ORAL_TABLET | Freq: Four times a day (QID) | ORAL | Status: AC
  Administered 2020-08-27: 1000 mg via ORAL
  Filled 2020-08-27: qty 2

## 2020-08-27 MED ORDER — ACETAMINOPHEN 500 MG PO TABS
1000.0000 mg | ORAL_TABLET | Freq: Four times a day (QID) | ORAL | Status: DC
  Administered 2020-08-27 – 2020-08-28 (×4): 1000 mg via ORAL
  Filled 2020-08-27 (×4): qty 2

## 2020-08-27 MED ORDER — CHLORHEXIDINE GLUCONATE 0.12 % MT SOLN
OROMUCOSAL | Status: AC
  Administered 2020-08-27: 15 mL via OROMUCOSAL
  Filled 2020-08-27: qty 15

## 2020-08-27 MED ORDER — LACTATED RINGERS IV SOLN: INTRAVENOUS | Status: DC

## 2020-08-27 MED ORDER — SODIUM CHLORIDE 0.9 % IR SOLN
Status: DC | PRN
  Administered 2020-08-27: 1000 mL

## 2020-08-27 MED ORDER — PROPOFOL 10 MG/ML IV BOLUS
INTRAVENOUS | Status: DC | PRN
  Administered 2020-08-27: 200 mg via INTRAVENOUS

## 2020-08-27 MED ORDER — HYDROMORPHONE HCL 1 MG/ML IJ SOLN
INTRAMUSCULAR | Status: DC | PRN
Start: 2020-08-27 — End: 2020-08-27
  Administered 2020-08-27 (×2): .5 mg via INTRAVENOUS

## 2020-08-27 SURGICAL SUPPLY — 46 items
ADH SKN CLS APL DERMABOND .7 (GAUZE/BANDAGES/DRESSINGS) ×1
AGENT HMST 10 BLLW SHRT CANN (HEMOSTASIS) ×1
APL LAPSCP (MISCELLANEOUS) ×1
APL PRP STRL LF DISP 70% ISPRP (MISCELLANEOUS) ×1
APPLICATOR LAPAROSCOPIC HEMOBL (MISCELLANEOUS) ×2 IMPLANT
APPLIER CLIP 5 13 M/L LIGAMAX5 (MISCELLANEOUS) ×3
APR CLP MED LRG 5 ANG JAW (MISCELLANEOUS) ×1
BAG SPEC RTRVL 10 TROC 200 (ENDOMECHANICALS) ×1
BLADE CLIPPER SURG (BLADE) IMPLANT
CANISTER SUCT 3000ML PPV (MISCELLANEOUS) ×3 IMPLANT
CHLORAPREP W/TINT 26 (MISCELLANEOUS) ×3 IMPLANT
CLIP APPLIE 5 13 M/L LIGAMAX5 (MISCELLANEOUS) ×1 IMPLANT
CLOSURE WOUND 1/2 X4 (GAUZE/BANDAGES/DRESSINGS) ×1
COVER SURGICAL LIGHT HANDLE (MISCELLANEOUS) ×3 IMPLANT
DERMABOND ADVANCED (GAUZE/BANDAGES/DRESSINGS) ×2
DERMABOND ADVANCED .7 DNX12 (GAUZE/BANDAGES/DRESSINGS) ×1 IMPLANT
ELECT REM PT RETURN 9FT ADLT (ELECTROSURGICAL) ×3
ELECTRODE REM PT RTRN 9FT ADLT (ELECTROSURGICAL) ×1 IMPLANT
GLOVE BIO SURGEON STRL SZ7 (GLOVE) ×3 IMPLANT
GLOVE BIOGEL PI IND STRL 7.5 (GLOVE) ×1 IMPLANT
GLOVE BIOGEL PI INDICATOR 7.5 (GLOVE) ×2
GOWN STRL REUS W/ TWL LRG LVL3 (GOWN DISPOSABLE) ×3 IMPLANT
GOWN STRL REUS W/TWL LRG LVL3 (GOWN DISPOSABLE) ×9
GRASPER SUT TROCAR 14GX15 (MISCELLANEOUS) ×3 IMPLANT
HEMOSTAT HEMOBLAST BELLOWS (HEMOSTASIS) ×2 IMPLANT
HEMOSTAT SNOW SURGICEL 2X4 (HEMOSTASIS) ×4 IMPLANT
KIT BASIN OR (CUSTOM PROCEDURE TRAY) ×3 IMPLANT
KIT TURNOVER KIT B (KITS) ×3 IMPLANT
NS IRRIG 1000ML POUR BTL (IV SOLUTION) ×3 IMPLANT
PAD ARMBOARD 7.5X6 YLW CONV (MISCELLANEOUS) ×3 IMPLANT
POUCH RETRIEVAL ECOSAC 10 (ENDOMECHANICALS) ×1 IMPLANT
POUCH RETRIEVAL ECOSAC 10MM (ENDOMECHANICALS) ×3
SCISSORS LAP 5X35 DISP (ENDOMECHANICALS) ×3 IMPLANT
SET IRRIG TUBING LAPAROSCOPIC (IRRIGATION / IRRIGATOR) ×3 IMPLANT
SET TUBE SMOKE EVAC HIGH FLOW (TUBING) ×3 IMPLANT
SLEEVE ENDOPATH XCEL 5M (ENDOMECHANICALS) ×6 IMPLANT
SPECIMEN JAR SMALL (MISCELLANEOUS) ×1 IMPLANT
STRIP CLOSURE SKIN 1/2X4 (GAUZE/BANDAGES/DRESSINGS) ×2 IMPLANT
SUT MNCRL AB 4-0 PS2 18 (SUTURE) ×3 IMPLANT
SUT VICRYL 0 UR6 27IN ABS (SUTURE) ×5 IMPLANT
TOWEL GREEN STERILE (TOWEL DISPOSABLE) ×3 IMPLANT
TOWEL GREEN STERILE FF (TOWEL DISPOSABLE) ×3 IMPLANT
TRAY LAPAROSCOPIC MC (CUSTOM PROCEDURE TRAY) ×3 IMPLANT
TROCAR XCEL BLUNT TIP 100MML (ENDOMECHANICALS) ×3 IMPLANT
TROCAR XCEL NON-BLD 5MMX100MML (ENDOMECHANICALS) ×3 IMPLANT
WATER STERILE IRR 1000ML POUR (IV SOLUTION) ×3 IMPLANT

## 2020-08-27 NOTE — Anesthesia Preprocedure Evaluation (Signed)
Anesthesia Evaluation  Patient identified by MRN, date of birth, ID band Patient awake    Reviewed: Allergy & Precautions, NPO status , Patient's Chart, lab work & pertinent test results  History of Anesthesia Complications Negative for: history of anesthetic complications  Airway Mallampati: I  TM Distance: >3 FB Neck ROM: Full    Dental  (+) Dental Advisory Given, Teeth Intact   Pulmonary neg shortness of breath, neg sleep apnea, neg COPD, neg recent URI, Current Smoker and Patient abstained from smoking.,  Covid-19 Nucleic Acid Test Results Lab Results      Component                Value               Date                      SARSCOV2NAA              NEGATIVE            08/27/2020              breath sounds clear to auscultation       Cardiovascular negative cardio ROS   Rhythm:Regular     Neuro/Psych  Headaches, neg Seizures negative psych ROS   GI/Hepatic Neg liver ROS, CHOLECYSTITIS   Endo/Other  negative endocrine ROS  Renal/GU negative Renal ROS     Musculoskeletal negative musculoskeletal ROS (+)   Abdominal   Peds  Hematology negative hematology ROS (+) Lab Results      Component                Value               Date                      WBC                      15.2 (H)            08/26/2020                HGB                      12.8                08/26/2020                HCT                      38.4                08/26/2020                MCV                      98.0                08/26/2020                PLT                      326                 08/26/2020              Anesthesia Other Findings   Reproductive/Obstetrics Lab Results  Component                Value               Date                      PREGTESTUR               NEGATIVE            08/26/2020                HCG                      7.5 (H)             08/26/2020                HCGQUANT                 758                  05/28/2017                                        Anesthesia Physical Anesthesia Plan  ASA: II  Anesthesia Plan: General   Post-op Pain Management:    Induction: Intravenous  PONV Risk Score and Plan: 2 and Ondansetron and Dexamethasone  Airway Management Planned: Oral ETT  Additional Equipment: None  Intra-op Plan:   Post-operative Plan: Extubation in OR  Informed Consent: I have reviewed the patients History and Physical, chart, labs and discussed the procedure including the risks, benefits and alternatives for the proposed anesthesia with the patient or authorized representative who has indicated his/her understanding and acceptance.     Dental advisory given  Plan Discussed with: CRNA and Surgeon  Anesthesia Plan Comments:         Anesthesia Quick Evaluation

## 2020-08-27 NOTE — Op Note (Signed)
Preoperative diagnosis: Acute cholecystitis Postoperative diagnosis acute on chronic cholecystitis Procedure: Laparoscopic cholecystectomy Surgeon: Dr. Harden Mo Assistant: Barnetta Chapel Anesthesia: General Complications: None Drains: None Estimated blood loss: 100 cc Specimens: Gallbladder and contents to pathology Sponge and count was correct at completion Disposition to recovery in stable condition  Indications: This a 34 year old healthy female who has right upper quadrant pain for 2 days. Her CT and ultrasound do not show any stones but she clinically has acute cholecystitis. I discussed going to the operating room today for laparoscopic cholecystectomy.  Procedure: After informed consent was obtained the patient was taken to the operating. She was given antibiotics. SCDs were in place. She was placed under general anesthesia without complication. She was prepped and draped in the standard sterile surgical fashion. Surgical timeout was then performed.  I infiltrated Marcaine below her umbilicus. I made a vertical incision. I then carried this through some significant scar tissue from her by prior tubal ligation. I eventually was able to enter into the peritoneal cavity without injury. She ended up having to what appeared to be a small umbilical hernia later with some fat incarcerated and that I reduced. I then placed a 0 Vicryl pursestring suture through the fascia and inserted a Hassan trocar. I insufflated the abdomen to 15 mmHg pressure. I then inserted 3 further 5 mm trochars in epigastrium and right upper quadrant under direct vision without complication. The gallbladder was noted to have acute and chronic cholecystitis. It was retracted cephalad. I took down some adhesions to the omentum as well as the duodenum. The gallbladder was then retracted lateral. I then was able to dissect in the triangle and clearly obtain the critical view of safety. I took the gallbladder off the cystic  plate of the liver enough so that I clearly had this view. I then clipped the duct and the artery 3 times and divided them leaving 2 clips in place. I then remove the gallbladder from the liver bed. Superiorly almost at the end there was some adherence to the liver and I made the liver bleed from a small sinus. This accounts for the blood loss. I took the gallbladder off of the liver bed. I cauterized this copiously. I then placed Hemoblast over this as well as another piece of Surgicel snow. The bleeding was stopped at completion. I evacuated all the old blood. The gallbladder was placed in a retrieval bag and removed from the umbilical incision. I then tied down my pursestring after removing the [facility] trocar. I did place 2 additional 0 Vicryl sutures using the suture passer device due to the fact I think she had a small umbilical hernia previously. The incisions were then closed with 4-0 Monocryl and glue. She tolerated this well was extubated transferred recovery stable.

## 2020-08-27 NOTE — Progress Notes (Signed)
PROGRESS NOTE  Brooke Carrillo HGD:[id number] DOB: 1986-03-12 DOA: 08/26/2020 PCP: Patient, No Pcp Per  Brief History   Brooke Carrillo is a 33 y.o. female with no significant past medical history presents to the ER with complaints of abdominal pain for the last 48 hours.  Pain is mostly in the right upper quadrant no associated nausea vomiting diarrhea fever chills or chest pain.  Patient has had a recent neck pain for which patient was started on steroids and patient has taken only 1 dose.  At this time denies any neck pain.  Denies any weakness of extremities.  In the ER patient was found to be sinus bradycardia.  Heart rate is around 44 bpm but blood pressure was stable.  On exam patient has significant right upper quadrant tenderness.  CT abdomen pelvis and ultrasound of the abdomen were done which shows features concerning for acute cholecystitis.  Labs show leukocytosis of 15,000 Covid test negative urine pregnancy negative.  LFTs were normal.  Patient admitted for further management of acute cholecystitis and sinus bradycardia.  General Surgery was consulted and the patient was taken for cholecystectomy this morning. She has tolerated the procedure well, although she is having significant pain.  Consultants  . General Surgery  Procedures  . Laparoscopic Cholecystectomy  Antibiotics   Anti-infectives (From admission, onward)   Start     Dose/Rate Route Frequency Ordered Stop   08/27/20 1200  piperacillin-tazobactam (ZOSYN) IVPB 3.375 g  Status:  Discontinued        3.375 g 12.5 mL/hr over 240 Minutes Intravenous Every 8 hours 08/27/20 0409 08/27/20 1106   08/27/20 0415  piperacillin-tazobactam (ZOSYN) IVPB 3.375 g        3.375 g 100 mL/hr over 30 Minutes Intravenous STAT 08/27/20 0409 08/27/20 0453   08/27/20 0300  cefTRIAXone (ROCEPHIN) 2 g in sodium chloride 0.9 % 100 mL IVPB        2 g 200 mL/hr over 30 Minutes Intravenous  Once 08/27/20 0245 08/27/20 0359    .  Subjective    The patient is seen after surgery. She is having significant pain, but has just been medicated before I entered the room.  Objective   Vitals:  Vitals:   08/27/20 1107 08/27/20 1321  BP: 121/66 119/69  Pulse: (!) 45 (!) 53  Resp: 16 18  Temp: 98.2 F (36.8 C) 98.1 F (36.7 C)  SpO2: 99% 98%   Exam:  Constitutional:  . The patient is awake, alert, and oriented x 3. She is in moderate distress from abdominal pain. Respiratory:  . No increased work of breathing. . No wheezes, rales, or rhonchi . No tactile fremitus Cardiovascular:  . Regular rate and rhythm . No murmurs, ectopy, or gallups. . No lateral PMI. No thrills. Abdomen:  . Abdomen is soft, tender, and slightly distended . No hernias, masses, or organomegaly . Hypoactive bowel sounds.  Musculoskeletal:  . No cyanosis, clubbing, or edema Skin:  . No rashes, lesions, ulcers . palpation of skin: no induration or nodules Neurologic:  . CN 2-12 intact . Sensation all 4 extremities intact Psychiatric:  . Mental status o Mood, affect appropriate o Orientation to person, place, time  . judgment and insight appear intact   I have personally reviewed the following:   Today's Data  . Vitals, CBC, CMP  Imaging  . CT abdomen and pelvis: Distended gallbladder with moderate wall thickening  Scheduled Meds: . acetaminophen  1,000 mg Oral Q6H   Continuous Infusions: .  dextrose 5 % and 0.9% NaCl 100 mL/hr at 08/27/20 1115    Principal Problem:   Acute cholecystitis Active Problems:   Sinus bradycardia   LOS: 0 days   A & P   Acute cholecystitis for which patient has been started on empiric antibiotics pain medication I discussed with Dr. Sheliah Hatch on-call surgeon will be seeing patient in consult in anticipation of surgery patient is kept n.p.o. She has had a laparoscopic cholecystectomy this morning. She has tolerated the procedure well.  Sinus bradycardia cause not clear.  Patient states she was never  told she had bradycardia.  Patient is not on any rate limiting medications.  May be related to pain medication which can cause bradycardia. Monitor on telemetry and avoid nodal blocking agents. The patient is asymptomatic. Tobacco abuse -cessation counseling requested.  Leukocytosis: Due to Acute cholecystitis. Monitor.  I have seen and examined this patient myself. I have spent 35 minutes in her evaluation and care.  DVT prophylaxis: SCDs.  Avoiding anticoagulation in anticipation of surgery. Code Status: Full code. Family Communication: Discussed with patient. Disposition Plan: Home when stable.  Conley Delisle, DO [facility] Direct contact: see [email or web address]  7PM-7AM contact night coverage as above  08/27/2020, 3:24 PM  LOS: 0 days

## 2020-08-27 NOTE — H&P (View-Only) (Signed)
° °Reason for Consult: abdominal pain °Referring Physician: Arshad Kakrakandy ° °Brooke Carrillo is an 34 y.o. female.  °HPI: 34 yo female with 2 day of abdominal pain. Pain is right upper quadrant, stabbing, constant. She took a narcotic which did not help the pain. She has nausea but no vomiting or diarrhea. ° °Past Medical History:  °Diagnosis Date  °• Headache   °• Pregnancy as incidental finding   ° ° °Past Surgical History:  °Procedure Laterality Date  °• LAPAROSCOPIC TUBAL LIGATION Bilateral 07/23/2018  ° Procedure: LAPAROSCOPIC TUBAL LIGATION;  Surgeon: Ervin, Michael L, MD;  Location: [facility];  Service: Gynecology;  Laterality: Bilateral;  °• ORIF FINGER / THUMB FRACTURE    °• WISDOM TOOTH EXTRACTION    ° ° °Family History  °Problem Relation Age of Onset  °• Hypertension Father   °• Stroke Father   °• Heart disease Father   °• Dementia Mother   °• Cancer Maternal Aunt   °     Breast  °• HIV Maternal Aunt   °• Stroke Maternal Uncle   °• Cancer Paternal Uncle   °     Colon  °• Cancer Maternal Grandmother   °• Hypertension Paternal Grandmother   °• Cancer Paternal Grandfather   °     Colon  ° ° °Social History:  reports that she has been smoking cigarettes. She has been smoking about 0.50 packs per day. She has never used smokeless tobacco. She reports that she does not drink alcohol and does not use drugs. ° °Allergies: No Known Allergies ° °Medications: I have reviewed the patient's current medications. ° °Results for orders placed or performed during the hospital encounter of 08/26/20 (from the past 48 hour(s))  °Lipase, blood     Status: None  ° Collection Time: 08/26/20  5:43 PM  °Result Value Ref Range  ° Lipase 19 11 - 51 U/L  °  Comment: Performed at [facility], [address] [place], [place] [address]  °Comprehensive metabolic panel     Status: Abnormal  ° Collection Time: 08/26/20  5:43 PM  °Result Value Ref Range  ° Sodium 142 135 - 145 mmol/L  ° Potassium 3.9 3.5 - 5.1 mmol/L  ° Chloride 110  98 - 111 mmol/L  ° CO2 22 22 - 32 mmol/L  ° Glucose, Bld 107 (H) 70 - 99 mg/dL  °  Comment: Glucose reference range applies only to samples taken after fasting for at least 8 hours.  ° BUN 15 6 - 20 mg/dL  ° Creatinine, Ser 0.78 0.44 - 1.00 mg/dL  ° Calcium 9.8 8.9 - 10.3 mg/dL  ° Total Protein 6.7 6.5 - 8.1 g/dL  ° Albumin 3.9 3.5 - 5.0 g/dL  ° AST 16 15 - 41 U/L  ° ALT 14 0 - 44 U/L  ° Alkaline Phosphatase 54 38 - 126 U/L  ° Total Bilirubin 0.6 0.3 - 1.2 mg/dL  ° GFR, Estimated >60 >60 mL/min  °  Comment: (NOTE) °Calculated using the CKD-EPI Creatinine Equation (2021) °  ° Anion gap 10 5 - 15  °  Comment: Performed at [facility], [address] [place], [place] [address]  °CBC     Status: Abnormal  ° Collection Time: 08/26/20  5:43 PM  °Result Value Ref Range  ° WBC 15.2 (H) 4.0 - 10.5 K/uL  ° RBC 3.92 3.87 - 5.11 MIL/uL  ° Hemoglobin 12.8 12.0 - 15.0 g/dL  ° HCT 38.4 36 -   46 %  ° MCV 98.0 80.0 - 100.0 fL  ° MCH 32.7 26.0 - 34.0 pg  ° MCHC 33.3 30.0 - 36.0 g/dL  ° RDW 13.6 11.5 - 15.5 %  ° Platelets 326 150 - 400 K/uL  ° nRBC 0.0 0.0 - 0.2 %  °  Comment: Performed at [facility], [address] [place], [place] [address]  °I-Stat beta hCG blood, ED     Status: Abnormal  ° Collection Time: 08/26/20  5:55 PM  °Result Value Ref Range  ° I-stat hCG, quantitative 7.5 (H) <5 mIU/mL  ° Comment 3          °  Comment:   GEST. AGE      CONC.  (mIU/mL) °  <=1 WEEK        5 - 50 °    2 WEEKS       50 - 500 °    3 WEEKS       100 - 10,000 °    4 WEEKS     1,000 - 30,000 °       °FEMALE AND NON-PREGNANT FEMALE: °    LESS THAN 5 mIU/mL °  °Urinalysis, Routine w reflex microscopic Urine, Clean Catch     Status: Abnormal  ° Collection Time: 08/26/20  6:27 PM  °Result Value Ref Range  ° Color, Urine YELLOW YELLOW  ° APPearance HAZY (A) CLEAR  ° Specific Gravity, Urine 1.029 1.005 - 1.030  ° pH 5.0 5.0 - 8.0  ° Glucose, UA NEGATIVE NEGATIVE mg/dL  ° Hgb urine dipstick NEGATIVE NEGATIVE  ° Bilirubin Urine NEGATIVE  NEGATIVE  ° Ketones, ur 20 (A) NEGATIVE mg/dL  ° Protein, ur NEGATIVE NEGATIVE mg/dL  ° Nitrite NEGATIVE NEGATIVE  ° Leukocytes,Ua NEGATIVE NEGATIVE  °  Comment: Performed at [facility], [address] [place], [place] [address]  °Pregnancy, urine     Status: None  ° Collection Time: 08/26/20  6:27 PM  °Result Value Ref Range  ° Preg Test, Ur NEGATIVE NEGATIVE  °  Comment:        °THE SENSITIVITY OF THIS °METHODOLOGY IS >20 mIU/mL. °Performed at  Hospital Lab, [address] [place], [place] [address] °  °Resp Panel by RT-PCR (Flu A&B, Covid) Nasopharyngeal Swab     Status: None  ° Collection Time: 08/27/20  3:12 AM  ° Specimen: Nasopharyngeal Swab; Nasopharyngeal(NP) swabs in vial transport medium  °Result Value Ref Range  ° SARS Coronavirus 2 by RT PCR NEGATIVE NEGATIVE  °  Comment: (NOTE) °SARS-CoV-2 target nucleic acids are NOT DETECTED. ° °The SARS-CoV-2 RNA is generally detectable in upper respiratory °specimens during the acute phase of infection. The lowest °concentration of SARS-CoV-2 viral copies this assay can detect is °138 copies/mL. A negative result does not preclude SARS-Cov-2 °infection and should not be used as the sole basis for treatment or °other patient management decisions. A negative result may occur with  °improper specimen collection/handling, submission of specimen other °than nasopharyngeal swab, presence of viral mutation(s) within the °areas targeted by this assay, and inadequate number of viral °copies(<138 copies/mL). A negative result must be combined with °clinical observations, patient history, and epidemiological °information. The expected result is Negative. ° °Fact Sheet for Patients:  °https://www.fda.gov/media/152166/download ° °Fact Sheet for Healthcare Providers:  °https://www.fda.gov/media/152162/download ° °This test is no t yet approved or cleared by the [place] FDA and  °has been authorized for detection and/or diagnosis of SARS-CoV-2 by °  FDA under  an Emergency Use Authorization (EUA). This EUA will remain  °in effect (meaning this test can be used) for the duration of the °COVID-19 declaration under Section 564(b)(1) of the Act, 21 °U.S.C.section 360bbb-3(b)(1), unless the authorization is terminated  °or revoked sooner.  ° ° °  ° Influenza A by PCR NEGATIVE NEGATIVE  ° Influenza B by PCR NEGATIVE NEGATIVE  °  Comment: (NOTE) °The Xpert Xpress SARS-CoV-2/FLU/RSV plus assay is intended as an aid °in the diagnosis of influenza from Nasopharyngeal swab specimens and °should not be used as a sole basis for treatment. Nasal washings and °aspirates are unacceptable for Xpert Xpress SARS-CoV-2/FLU/RSV °testing. ° °Fact Sheet for Patients: °https://www.fda.gov/media/152166/download ° °Fact Sheet for Healthcare Providers: °https://www.fda.gov/media/152162/download ° °This test is not yet approved or cleared by the [place] FDA and °has been authorized for detection and/or diagnosis of SARS-CoV-2 by °FDA under an Emergency Use Authorization (EUA). This EUA will remain °in effect (meaning this test can be used) for the duration of the °COVID-19 declaration under Section 564(b)(1) of the Act, 21 U.S.C. °section 360bbb-3(b)(1), unless the authorization is terminated or °revoked. ° °Performed at [facility], [address] [place], [place] °[address] °  ° ° °CT ABDOMEN PELVIS W CONTRAST ° °Result Date: 08/27/2020 °CLINICAL DATA:  Right flank pain EXAM: CT ABDOMEN AND PELVIS WITH CONTRAST TECHNIQUE: Multidetector CT imaging of the abdomen and pelvis was performed using the standard protocol following bolus administration of intravenous contrast. CONTRAST:  100mL OMNIPAQUE IOHEXOL 300 MG/ML  SOLN COMPARISON:  07/11/2015 FINDINGS: LOWER CHEST: Normal. HEPATOBILIARY: Normal hepatic contours. No intra- or extrahepatic biliary dilatation. Distended gallbladder with moderate wall thickening. PANCREAS: Normal pancreas. No ductal dilatation or peripancreatic fluid  collection. SPLEEN: Normal. ADRENALS/URINARY TRACT: The adrenal glands are normal. No hydronephrosis, nephroureterolithiasis or solid renal mass. The urinary bladder is normal for degree of distention STOMACH/BOWEL: There is no hiatal hernia. Normal duodenal course and caliber. No small bowel dilatation or inflammation. No focal colonic abnormality. Normal appendix. VASCULAR/LYMPHATIC: Normal course and caliber of the major abdominal vessels. No abdominal or pelvic lymphadenopathy. REPRODUCTIVE: Normal uterus. No adnexal mass. Small amount of fluid in the pelvis. MUSCULOSKELETAL. No bony spinal canal stenosis or focal osseous abnormality. OTHER: None. IMPRESSION: Distended gallbladder with moderate wall thickening, possibly indicating acute cholecystitis. Electronically Signed   By: Kevin  Herman M.D.   On: 08/27/2020 00:48  ° °US Abdomen Limited RUQ (LIVER/GB) ° °Result Date: 08/27/2020 °CLINICAL DATA:  Right upper quadrant pain EXAM: ULTRASOUND ABDOMEN LIMITED RIGHT UPPER QUADRANT COMPARISON:  CT from earlier in the same day. FINDINGS: Gallbladder: Gallbladder is well distended. Wall is thickened at 7.7 mm. Pericholecystic fluid is noted as well. These changes are highly suggestive of acute cholecystitis despite the lack of gallstones or positive sonographic Murphy's sign. Patient had been medicated prior to the ultrasound exam. Common bile duct: Diameter: 3.1 mm. Liver: No focal lesion identified. Within normal limits in parenchymal echogenicity. Portal vein is patent on color Doppler imaging with normal direction of blood flow towards the liver. Other: None. IMPRESSION: Changes consistent with acute cholecystitis. Electronically Signed   By: Mark  Lukens M.D.   On: 08/27/2020 02:21  ° ° °Review of Systems  °Constitutional: Negative for chills and fever.  °HENT: Negative for hearing loss.   °Eyes: Negative for blurred vision and double vision.  °Respiratory: Negative for cough and hemoptysis.   °Cardiovascular:  Negative for chest pain and palpitations.  °Gastrointestinal: Positive for abdominal pain and nausea.   Negative for diarrhea and vomiting.  °Genitourinary: Negative for dysuria and urgency.  °Musculoskeletal: Negative for myalgias and neck pain.  °Skin: Negative for itching and rash.  °Neurological: Negative for dizziness, tingling and headaches.  °Endo/Heme/Allergies: Does not bruise/bleed easily.  °Psychiatric/Behavioral: Negative for depression and suicidal ideas.  ° ° °PE °Blood pressure 133/74, pulse (!) 40, temperature 98.4 °F (36.9 °C), temperature source Oral, resp. rate 18, last menstrual period 08/22/2020, SpO2 98 %. °Constitutional: NAD; conversant; no deformities °Eyes: Moist conjunctiva; no lid lag; anicteric; PERRL °Neck: Trachea midline; no thyromegaly °Lungs: Normal respiratory effort; no tactile fremitus °CV: RRR; no palpable thrills; no pitting edema °GI: Abd tender in RUQ, +Murphy's sign; no palpable hepatosplenomegaly °MSK: Normal gait; no clubbing/cyanosis °Psychiatric: Appropriate affect; alert and oriented x3 °Lymphatic: No palpable cervical or axillary lymphadenopathy °Skin: No major subcutaneous nodules. Warm and dry °  °Assessment/Plan: °33 yo female with RUQ pain, US showing wall thickening and fluid but strangely no stones. She has a leukocytosis of 15. °-Given her exam and WBC and imaging, I think the best plan is to proceed with surgery (lap cholecystectomy) °-We discussed the etiology of her pain, we discussed treatment options and recommended surgery. We discussed details of surgery including general anesthesia, laparoscopic approach, identification of cystic duct and common bile duct. Ligation of cystic duct and cystic artery. Possible need for intraoperative cholangiogram or open procedure. Possible risks of common bile duct injury, liver injury, cystic duct leak, bleeding, infection, post-cholecystectomy syndrome. The patient showed good understanding and all questions were  answered °-pain control °-discussed options of discharge after surgery vs observation stay overnight ° °Aharon Carriere Aaron Evelynn Hench °08/27/2020, 6:46 AM ° °

## 2020-08-27 NOTE — Interval H&P Note (Signed)
History and Physical Interval Note:  08/27/2020 8:41 AM I have seen patient, examined her and noted her significant ruq tenderness.  Wbc up, ct and [place] with no stones/sludge but look like acute cholecystitis. I agree with Dr Sheliah Hatch I think proceeding with lap chole today appropriate. I discussed the procedure in detail.  We discussed the risks and benefits of a laparoscopic cholecystectomy and possible cholangiogram including, but not limited to bleeding, infection, injury to surrounding structures such as the intestine or liver, bile leak, retained gallstones, need to convert to an open procedure, prolonged diarrhea, blood clots such as  DVT, common bile duct injury, anesthesia risks, and possible need for additional procedures.  The likelihood of improvement in symptoms and return to the patient's normal status is good. We discussed the typical post-operative recovery course.  Brooke Carrillo  has presented today for surgery, with the diagnosis of CHOLECYSTITIS.  The various methods of treatment have been discussed with the patient and family. After consideration of risks, benefits and other options for treatment, the patient has consented to  Procedure(s): LAPAROSCOPIC CHOLECYSTECTOMY (N/A) as a surgical intervention.  The patient's history has been reviewed, patient examined, no change in status, stable for surgery.  I have reviewed the patient's chart and labs.  Questions were answered to the patient's satisfaction.     [name]

## 2020-08-27 NOTE — Anesthesia Procedure Notes (Signed)
Procedure Name: Intubation Date/Time: 08/27/2020 9:10 AM Performed by: Verdie Drown, CRNA Pre-anesthesia Checklist: Patient identified, Emergency Drugs available, Suction available and Patient being monitored Patient Re-evaluated:Patient Re-evaluated prior to induction Oxygen Delivery Method: Circle System Utilized Preoxygenation: Pre-oxygenation with 100% oxygen Induction Type: IV induction Ventilation: Mask ventilation without difficulty Laryngoscope Size: Mac and 3 Grade View: Grade I Tube type: Oral Tube size: 7.0 mm Number of attempts: 1 Airway Equipment and Method: Stylet and Oral airway Placement Confirmation: ETT inserted through vocal cords under direct vision,  positive ETCO2 and breath sounds checked- equal and bilateral Secured at: 20 cm Tube secured with: Tape Dental Injury: Teeth and Oropharynx as per pre-operative assessment

## 2020-08-27 NOTE — H&P (Signed)
History and Physical    Brooke Carrillo RXV:[id number] DOB: Jun 02, 1986 DOA: 08/26/2020  PCP: Patient, No Pcp Per  Patient coming from: Home.  Chief Complaint: Abdominal pain.  HPI: Brooke Carrillo is a 34 y.o. female with no significant past medical history presents to the ER with complaints of abdominal pain for the last 48 hours.  Pain is mostly in the right upper quadrant no associated nausea vomiting diarrhea fever chills or chest pain.  Patient has had a recent neck pain for which patient was started on steroids and patient has taken only 1 dose.  At this time denies any neck pain.  Denies any weakness of extremities.  ED Course: In the ER patient was found to be sinus bradycardia.  Heart rate is around 44 bpm but blood pressure was stable.  On exam patient has significant right upper quadrant tenderness.  CT abdomen pelvis and ultrasound of the abdomen were done which shows features concerning for acute cholecystitis.  Labs show leukocytosis of 15,000 Covid test negative urine pregnancy negative.  LFTs were normal.  Patient admitted for further management of acute cholecystitis and sinus bradycardia.  Review of Systems: As per HPI, rest all negative.   Past Medical History:  Diagnosis Date  . Headache   . Pregnancy as incidental finding     Past Surgical History:  Procedure Laterality Date  . LAPAROSCOPIC TUBAL LIGATION Bilateral 07/23/2018   Procedure: LAPAROSCOPIC TUBAL LIGATION;  Surgeon: Hermina Staggers, MD;  Location: [facility];  Service: Gynecology;  Laterality: Bilateral;  . ORIF FINGER / THUMB FRACTURE    . WISDOM TOOTH EXTRACTION       reports that she has been smoking cigarettes. She has been smoking about 0.50 packs per day. She has never used smokeless tobacco. She reports that she does not drink alcohol and does not use drugs.  No Known Allergies  Family History  Problem Relation Age of Onset  . Hypertension Father   . Stroke Father   . Heart disease Father   .  Dementia Mother   . Cancer Maternal Aunt        Breast  . HIV Maternal Aunt   . Stroke Maternal Uncle   . Cancer Paternal Uncle        Colon  . Cancer Maternal Grandmother   . Hypertension Paternal Grandmother   . Cancer Paternal Grandfather        Colon    Prior to Admission medications   Medication Sig Start Date End Date Taking? Authorizing Provider  Aspirin-Acetaminophen-Caffeine (EXCEDRIN PO) Take by mouth.    [provider]  HYDROcodone-acetaminophen (NORCO) 7.5-325 MG tablet Take 1 tablet by mouth every 6 (six) hours as needed for moderate pain. 08/24/20   Eustace Moore, MD  ibuprofen (ADVIL,MOTRIN) 800 MG tablet Take 1 tablet (800 mg total) by mouth every 8 (eight) hours as needed. 07/23/18   Hermina Staggers, MD  methylPREDNISolone (MEDROL DOSEPAK) 4 MG TBPK tablet tad 08/24/20   Eustace Moore, MD  oxyCODONE (OXY IR/ROXICODONE) 5 MG immediate release tablet Take 1 tablet (5 mg total) by mouth every 6 (six) hours as needed for severe pain. 07/23/18   Hermina Staggers, MD  tiZANidine (ZANAFLEX) 4 MG tablet Take 1-2 tablets (4-8 mg total) by mouth every 6 (six) hours as needed for muscle spasms. 08/24/20   Eustace Moore, MD    Physical Exam: Constitutional: Moderately built and nourished. Vitals:   08/27/20 0200 08/27/20 0230 08/27/20  0245 08/27/20 0300  BP:  (!) 125/56 124/74 (!) 129/59  Pulse: (!) 41 (!) 44 (!) 47 (!) 40  Resp: 19 16 20 19   Temp:      TempSrc:      SpO2: 100% 99% 98% 98%   Eyes: Anicteric no pallor. ENMT: No discharge from the ears eyes nose or mouth. Neck: No mass felt.  No neck rigidity. Respiratory: No rhonchi or crepitations. Cardiovascular: S1-S2 heard. Abdomen: Right upper quadrant tenderness no guarding or rigidity. Musculoskeletal: No edema. Skin: No rash. Neurologic: Alert awake oriented to time place and person.  Moves all extremities. Psychiatric: Appears normal.  Normal affect.   Labs on Admission: I have  personally reviewed following labs and imaging studies  CBC: Recent Labs  Lab 08/26/20 1743  WBC 15.2*  HGB 12.8  HCT 38.4  MCV 98.0  PLT 326   Basic Metabolic Panel: Recent Labs  Lab 08/26/20 1743  NA 142  K 3.9  CL 110  CO2 22  GLUCOSE 107*  BUN 15  CREATININE 0.78  CALCIUM 9.8   GFR: CrCl cannot be calculated (Unknown ideal weight.). Liver Function Tests: Recent Labs  Lab 08/26/20 1743  AST 16  ALT 14  ALKPHOS 54  BILITOT 0.6  PROT 6.7  ALBUMIN 3.9   Recent Labs  Lab 08/26/20 1743  LIPASE 19   No results for input(s): AMMONIA in the last 168 hours. Coagulation Profile: No results for input(s): INR, PROTIME in the last 168 hours. Cardiac Enzymes: No results for input(s): CKTOTAL, CKMB, CKMBINDEX, TROPONINI in the last 168 hours. BNP (last 3 results) No results for input(s): PROBNP in the last 8760 hours. HbA1C: No results for input(s): HGBA1C in the last 72 hours. CBG: No results for input(s): GLUCAP in the last 168 hours. Lipid Profile: No results for input(s): CHOL, HDL, LDLCALC, TRIG, CHOLHDL, LDLDIRECT in the last 72 hours. Thyroid Function Tests: No results for input(s): TSH, T4TOTAL, FREET4, T3FREE, THYROIDAB in the last 72 hours. Anemia Panel: No results for input(s): VITAMINB12, FOLATE, FERRITIN, TIBC, IRON, RETICCTPCT in the last 72 hours. Urine analysis:    Component Value Date/Time   COLORURINE YELLOW 08/26/2020 1827   APPEARANCEUR HAZY (A) 08/26/2020 1827   LABSPEC 1.029 08/26/2020 1827   PHURINE 5.0 08/26/2020 1827   GLUCOSEU NEGATIVE 08/26/2020 1827   HGBUR NEGATIVE 08/26/2020 1827   BILIRUBINUR NEGATIVE 08/26/2020 1827   BILIRUBINUR negative 01/02/2015 1054   KETONESUR 20 (A) 08/26/2020 1827   PROTEINUR NEGATIVE 08/26/2020 1827   UROBILINOGEN 0.2 07/11/2015 1511   NITRITE NEGATIVE 08/26/2020 1827   LEUKOCYTESUR NEGATIVE 08/26/2020 1827   Sepsis Labs: @LABRCNTIP (procalcitonin:4,lacticidven:4) )No results found for this or  any previous visit (from the past 240 hour(s)).   Radiological Exams on Admission: CT ABDOMEN PELVIS W CONTRAST  Result Date: 08/27/2020 CLINICAL DATA:  Right flank pain EXAM: CT ABDOMEN AND PELVIS WITH CONTRAST TECHNIQUE: Multidetector CT imaging of the abdomen and pelvis was performed using the standard protocol following bolus administration of intravenous contrast. CONTRAST:  OMNIPAQUE IOHEXOL 300 MG/ML  SOLN COMPARISON:  07/11/2015 FINDINGS: LOWER CHEST: Normal. HEPATOBILIARY: Normal hepatic contours. No intra- or extrahepatic biliary dilatation. Distended gallbladder with moderate wall thickening. PANCREAS: Normal pancreas. No ductal dilatation or peripancreatic fluid collection. SPLEEN: Normal. ADRENALS/URINARY TRACT: The adrenal glands are normal. No hydronephrosis, nephroureterolithiasis or solid renal mass. The urinary bladder is normal for degree of distention STOMACH/BOWEL: There is no hiatal hernia. Normal duodenal course and caliber. No small bowel dilatation or inflammation.  No focal colonic abnormality. Normal appendix. VASCULAR/LYMPHATIC: Normal course and caliber of the major abdominal vessels. No abdominal or pelvic lymphadenopathy. REPRODUCTIVE: Normal uterus. No adnexal mass. Small amount of fluid in the pelvis. MUSCULOSKELETAL. No bony spinal canal stenosis or focal osseous abnormality. OTHER: None. IMPRESSION: Distended gallbladder with moderate wall thickening, possibly indicating acute cholecystitis. Electronically Signed   By: Deatra Robinson M.D.   On: 08/27/2020 00:48   US Abdomen Limited RUQ (LIVER/GB)  Result Date: 08/27/2020 CLINICAL DATA:  Right upper quadrant pain EXAM: ULTRASOUND ABDOMEN LIMITED RIGHT UPPER QUADRANT COMPARISON:  CT from earlier in the same day. FINDINGS: Gallbladder: Gallbladder is well distended. Wall is thickened at 7.7 mm. Pericholecystic fluid is noted as well. These changes are highly suggestive of acute cholecystitis despite the lack of  gallstones or positive sonographic Murphy's sign. Patient had been medicated prior to the ultrasound exam. Common bile duct: Diameter: 3.1 mm. Liver: No focal lesion identified. Within normal limits in parenchymal echogenicity. Portal vein is patent on color Doppler imaging with normal direction of blood flow towards the liver. Other: None. IMPRESSION: Changes consistent with acute cholecystitis. Electronically Signed   By: Alcide Clever M.D.   On: 08/27/2020 02:21    EKG: Independently reviewed.  Sinus bradycardia heart rate around 44 bpm.  Nonspecific T wave changes.  Assessment/Plan Principal Problem:   Acute cholecystitis Active Problems:   Sinus bradycardia    1. Acute cholecystitis for which patient has been started on empiric antibiotics pain medication I discussed with Dr. Sheliah Hatch on-call surgeon will be seeing patient in consult in anticipation of surgery patient is kept n.p.o. 2. Sinus bradycardia cause not clear.  Patient states she was never told she had bradycardia.  Patient is not on any rate limiting medications.  May be related to pain.  Will check TSH troponin and monitor intermittently. 3. Tobacco abuse -cessation counseling requested.  Given that patient has acute cholecystitis and sinus bradycardia will need close monitoring and inpatient status.   DVT prophylaxis: SCDs.  Avoiding anticoagulation in anticipation of surgery. Code Status: Full code. Family Communication: Discussed with patient. Disposition Plan: Home when stable. Consults called: General surgery. Admission status: Inpatient.   Eduard Clos MD [facility] Pager [phone].  If 7PM-7AM, please contact night-coverage [email or web address] Password [facility]  08/27/2020, 3:57 AM

## 2020-08-27 NOTE — Progress Notes (Signed)
Pharmacy Antibiotic Note  Brooke Carrillo is a 34 y.o. female admitted on 08/26/2020 with intra-abd infection.  Pharmacy has been consulted for Zosyn dosing.  Plan: Zosyn 3.375gm IV q8h  Will f/u renal function, micro data, and pt's clinical condition     Temp (24hrs), Avg:98.2 F (36.8 C), Min:98 F (36.7 C), Max:98.4 F (36.9 C)  Recent Labs  Lab 08/26/20 1743  WBC 15.2*  CREATININE 0.78    CrCl cannot be calculated (Unknown ideal weight.).    No Known Allergies  Antimicrobials this admission: 11/29 Rocephin x1 11/29 Zosyn >>   Thank you for allowing pharmacy to be a part of this patient's care.  Christoper Fabian, PharmD, BCPS Please see amion for complete clinical pharmacist phone list 08/27/2020 4:04 AM

## 2020-08-27 NOTE — Consult Note (Signed)
Reason for Consult: abdominal pain Referring Physician: Midge MiniumArshad Kakrakandy  Brooke Carrillo is an 34 y.o. female.  HPI: 34 yo female with 2 day of abdominal pain. Pain is right upper quadrant, stabbing, constant. She took a narcotic which did not help the pain. She has nausea but no vomiting or diarrhea.  Past Medical History:  Diagnosis Date   Headache    Pregnancy as incidental finding     Past Surgical History:  Procedure Laterality Date   LAPAROSCOPIC TUBAL LIGATION Bilateral 07/23/2018   Procedure: LAPAROSCOPIC TUBAL LIGATION;  Surgeon: Hermina StaggersErvin, Michael L, MD;  Location: [facility];  Service: Gynecology;  Laterality: Bilateral;   ORIF FINGER / THUMB FRACTURE     WISDOM TOOTH EXTRACTION      Family History  Problem Relation Age of Onset   Hypertension Father    Stroke Father    Heart disease Father    Dementia Mother    Cancer Maternal Aunt        Breast   HIV Maternal Aunt    Stroke Maternal Uncle    Cancer Paternal Uncle        Colon   Cancer Maternal Grandmother    Hypertension Paternal Grandmother    Cancer Paternal Grandfather        Colon    Social History:  reports that she has been smoking cigarettes. She has been smoking about 0.50 packs per day. She has never used smokeless tobacco. She reports that she does not drink alcohol and does not use drugs.  Allergies: No Known Allergies  Medications: I have reviewed the patient's current medications.  Results for orders placed or performed during the hospital encounter of 08/26/20 (from the past 48 hour(s))  Lipase, blood     Status: None   Collection Time: 08/26/20  5:43 PM  Result Value Ref Range   Lipase 19 11 - 51 U/L    Comment: Performed at [facility], [address] [place], [place] [address]  Comprehensive metabolic panel     Status: Abnormal   Collection Time: 08/26/20  5:43 PM  Result Value Ref Range   Sodium 142 135 - 145 mmol/L   Potassium 3.9 3.5 - 5.1 mmol/L   Chloride 110  98 - 111 mmol/L   CO2 22 22 - 32 mmol/L   Glucose, Bld 107 (H) 70 - 99 mg/dL    Comment: Glucose reference range applies only to samples taken after fasting for at least 8 hours.   BUN 15 6 - 20 mg/dL   Creatinine, Ser 6.040.78 0.44 - 1.00 mg/dL   Calcium 9.8 8.9 - 54.010.3 mg/dL   Total Protein 6.7 6.5 - 8.1 g/dL   Albumin 3.9 3.5 - 5.0 g/dL   AST 16 15 - 41 U/L   ALT 14 0 - 44 U/L   Alkaline Phosphatase 54 38 - 126 U/L   Total Bilirubin 0.6 0.3 - 1.2 mg/dL   GFR, Estimated >98>60 >11>60 mL/min    Comment: (NOTE) Calculated using the CKD-EPI Creatinine Equation (2021)    Anion gap 10 5 - 15    Comment: Performed at [facility], [address] [place], [place] [address]  CBC     Status: Abnormal   Collection Time: 08/26/20  5:43 PM  Result Value Ref Range   WBC 15.2 (H) 4.0 - 10.5 K/uL   RBC 3.92 3.87 - 5.11 MIL/uL   Hemoglobin 12.8 12.0 - 15.0 g/dL   HCT 29.538.4 36 -  46 %   MCV 98.0 80.0 - 100.0 fL   MCH 32.7 26.0 - 34.0 pg   MCHC 33.3 30.0 - 36.0 g/dL   RDW 86.7 61.9 - 50.9 %   Platelets 326 150 - 400 K/uL   nRBC 0.0 0.0 - 0.2 %    Comment: Performed at [facility], [address] [place], [place] [address]  I-Stat beta hCG blood, ED     Status: Abnormal   Collection Time: 08/26/20  5:55 PM  Result Value Ref Range   I-stat hCG, quantitative 7.5 (H) <5 mIU/mL   Comment 3            Comment:   GEST. AGE      CONC.  (mIU/mL)   <=1 WEEK        5 - 50     2 WEEKS       50 - 500     3 WEEKS       100 - 10,000     4 WEEKS     1,000 - 30,000        FEMALE AND NON-PREGNANT FEMALE:     LESS THAN 5 mIU/mL   Urinalysis, Routine w reflex microscopic Urine, Clean Catch     Status: Abnormal   Collection Time: 08/26/20  6:27 PM  Result Value Ref Range   Color, Urine YELLOW YELLOW   APPearance HAZY (A) CLEAR   Specific Gravity, Urine 1.029 1.005 - 1.030   pH 5.0 5.0 - 8.0   Glucose, UA NEGATIVE NEGATIVE mg/dL   Hgb urine dipstick NEGATIVE NEGATIVE   Bilirubin Urine NEGATIVE  NEGATIVE   Ketones, ur 20 (A) NEGATIVE mg/dL   Protein, ur NEGATIVE NEGATIVE mg/dL   Nitrite NEGATIVE NEGATIVE   Leukocytes,Ua NEGATIVE NEGATIVE    Comment: Performed at [facility], [address] [place], [place] [address]  Pregnancy, urine     Status: None   Collection Time: 08/26/20  6:27 PM  Result Value Ref Range   Preg Test, Ur NEGATIVE NEGATIVE    Comment:        THE SENSITIVITY OF THIS METHODOLOGY IS >20 mIU/mL. Performed at [facility], [address] [place], [place] [address]   Resp Panel by RT-PCR (Flu A&B, Covid) Nasopharyngeal Swab     Status: None   Collection Time: 08/27/20  3:12 AM   Specimen: Nasopharyngeal Swab; Nasopharyngeal(NP) swabs in vial transport medium  Result Value Ref Range   SARS Coronavirus 2 by RT PCR NEGATIVE NEGATIVE    Comment: (NOTE) SARS-CoV-2 target nucleic acids are NOT DETECTED.  The SARS-CoV-2 RNA is generally detectable in upper respiratory specimens during the acute phase of infection. The lowest concentration of SARS-CoV-2 viral copies this assay can detect is 138 copies/mL. A negative result does not preclude SARS-Cov-2 infection and should not be used as the sole basis for treatment or other patient management decisions. A negative result may occur with  improper specimen collection/handling, submission of specimen other than nasopharyngeal swab, presence of viral mutation(s) within the areas targeted by this assay, and inadequate number of viral copies(<138 copies/mL). A negative result must be combined with clinical observations, patient history, and epidemiological information. The expected result is Negative.  Fact Sheet for Patients:  BloggerCourse.com  Fact Sheet for Healthcare Providers:  SeriousBroker.it  This test is no t yet approved or cleared by the [place] FDA and  has been authorized for detection and/or diagnosis of SARS-CoV-2 by  FDA under  an Emergency Use Authorization (EUA). This EUA will remain  in effect (meaning this test can be used) for the duration of the COVID-19 declaration under Section 564(b)(1) of the Act, 21 U.S.C.section 360bbb-3(b)(1), unless the authorization is terminated  or revoked sooner.       Influenza A by PCR NEGATIVE NEGATIVE   Influenza B by PCR NEGATIVE NEGATIVE    Comment: (NOTE) The Xpert Xpress SARS-CoV-2/FLU/RSV plus assay is intended as an aid in the diagnosis of influenza from Nasopharyngeal swab specimens and should not be used as a sole basis for treatment. Nasal washings and aspirates are unacceptable for Xpert Xpress SARS-CoV-2/FLU/RSV testing.  Fact Sheet for Patients: BloggerCourse.com  Fact Sheet for Healthcare Providers: SeriousBroker.it  This test is not yet approved or cleared by the [place] FDA and has been authorized for detection and/or diagnosis of SARS-CoV-2 by FDA under an Emergency Use Authorization (EUA). This EUA will remain in effect (meaning this test can be used) for the duration of the COVID-19 declaration under Section 564(b)(1) of the Act, 21 U.S.C. section 360bbb-3(b)(1), unless the authorization is terminated or revoked.  Performed at [facility], [address] [place], [place] [address]     CT ABDOMEN PELVIS W CONTRAST  Result Date: 08/27/2020 CLINICAL DATA:  Right flank pain EXAM: CT ABDOMEN AND PELVIS WITH CONTRAST TECHNIQUE: Multidetector CT imaging of the abdomen and pelvis was performed using the standard protocol following bolus administration of intravenous contrast. CONTRAST:  OMNIPAQUE IOHEXOL 300 MG/ML  SOLN COMPARISON:  07/11/2015 FINDINGS: LOWER CHEST: Normal. HEPATOBILIARY: Normal hepatic contours. No intra- or extrahepatic biliary dilatation. Distended gallbladder with moderate wall thickening. PANCREAS: Normal pancreas. No ductal dilatation or peripancreatic fluid  collection. SPLEEN: Normal. ADRENALS/URINARY TRACT: The adrenal glands are normal. No hydronephrosis, nephroureterolithiasis or solid renal mass. The urinary bladder is normal for degree of distention STOMACH/BOWEL: There is no hiatal hernia. Normal duodenal course and caliber. No small bowel dilatation or inflammation. No focal colonic abnormality. Normal appendix. VASCULAR/LYMPHATIC: Normal course and caliber of the major abdominal vessels. No abdominal or pelvic lymphadenopathy. REPRODUCTIVE: Normal uterus. No adnexal mass. Small amount of fluid in the pelvis. MUSCULOSKELETAL. No bony spinal canal stenosis or focal osseous abnormality. OTHER: None. IMPRESSION: Distended gallbladder with moderate wall thickening, possibly indicating acute cholecystitis. Electronically Signed   By: Deatra Robinson M.D.   On: 08/27/2020 00:48   US Abdomen Limited RUQ (LIVER/GB)  Result Date: 08/27/2020 CLINICAL DATA:  Right upper quadrant pain EXAM: ULTRASOUND ABDOMEN LIMITED RIGHT UPPER QUADRANT COMPARISON:  CT from earlier in the same day. FINDINGS: Gallbladder: Gallbladder is well distended. Wall is thickened at 7.7 mm. Pericholecystic fluid is noted as well. These changes are highly suggestive of acute cholecystitis despite the lack of gallstones or positive sonographic Murphy's sign. Patient had been medicated prior to the ultrasound exam. Common bile duct: Diameter: 3.1 mm. Liver: No focal lesion identified. Within normal limits in parenchymal echogenicity. Portal vein is patent on color Doppler imaging with normal direction of blood flow towards the liver. Other: None. IMPRESSION: Changes consistent with acute cholecystitis. Electronically Signed   By: Alcide Clever M.D.   On: 08/27/2020 02:21    Review of Systems  Constitutional: Negative for chills and fever.  HENT: Negative for hearing loss.   Eyes: Negative for blurred vision and double vision.  Respiratory: Negative for cough and hemoptysis.   Cardiovascular:  Negative for chest pain and palpitations.  Gastrointestinal: Positive for abdominal pain and nausea.  Negative for diarrhea and vomiting.  Genitourinary: Negative for dysuria and urgency.  Musculoskeletal: Negative for myalgias and neck pain.  Skin: Negative for itching and rash.  Neurological: Negative for dizziness, tingling and headaches.  Endo/Heme/Allergies: Does not bruise/bleed easily.  Psychiatric/Behavioral: Negative for depression and suicidal ideas.    PE Blood pressure 133/74, pulse (!) 40, temperature 98.4 F (36.9 C), temperature source Oral, resp. rate 18, last menstrual period 08/22/2020, SpO2 98 %. Constitutional: NAD; conversant; no deformities Eyes: Moist conjunctiva; no lid lag; anicteric; PERRL Neck: Trachea midline; no thyromegaly Lungs: Normal respiratory effort; no tactile fremitus CV: RRR; no palpable thrills; no pitting edema GI: Abd tender in RUQ, +Murphy's sign; no palpable hepatosplenomegaly MSK: Normal gait; no clubbing/cyanosis Psychiatric: Appropriate affect; alert and oriented x3 Lymphatic: No palpable cervical or axillary lymphadenopathy Skin: No major subcutaneous nodules. Warm and dry   Assessment/Plan: 33 yo female with RUQ pain, US showing wall thickening and fluid but strangely no stones. She has a leukocytosis of 15. -Given her exam and WBC and imaging, I think the best plan is to proceed with surgery (lap cholecystectomy) -We discussed the etiology of her pain, we discussed treatment options and recommended surgery. We discussed details of surgery including general anesthesia, laparoscopic approach, identification of cystic duct and common bile duct. Ligation of cystic duct and cystic artery. Possible need for intraoperative cholangiogram or open procedure. Possible risks of common bile duct injury, liver injury, cystic duct leak, bleeding, infection, post-cholecystectomy syndrome. The patient showed good understanding and all questions were  answered -pain control -discussed options of discharge after surgery vs observation stay overnight  De Blanch Arman Loy 08/27/2020, 6:46 AM

## 2020-08-27 NOTE — Transfer of Care (Signed)
Immediate Anesthesia Transfer of Care Note  Patient: Brooke Carrillo  Procedure(s) Performed: LAPAROSCOPIC CHOLECYSTECTOMY (N/A Abdomen)  Patient Location: PACU  Anesthesia Type:General  Level of Consciousness: patient cooperative and responds to stimulation  Airway & Oxygen Therapy: Patient Spontanous Breathing and Patient connected to face mask oxygen  Post-op Assessment: Report given to RN, Post -op Vital signs reviewed and stable and Patient moving all extremities X 4  Post vital signs: Reviewed and stable  Last Vitals:  Vitals Value Taken Time  BP 132/77 08/27/20 1017  Temp    Pulse 64 08/27/20 1023  Resp 22 08/27/20 1023  SpO2 94 % 08/27/20 1023  Vitals shown include unvalidated device data.  Last Pain:  Vitals:   08/27/20 0730  TempSrc: Oral  PainSc: 5          Complications: No complications documented.

## 2020-08-27 NOTE — Progress Notes (Signed)
Pt just arrived to unit from ER at approx 0755, VS completed and telemetry monitor being placed when the OR called for pt. Pt going to OR for lap chole per report.  Report called to Gaynelle Adu, RN in short stay at [phone].

## 2020-08-27 NOTE — Discharge Instructions (Signed)
[facility]  Please arrive at least 30 min before your appointment to complete your check in paperwork.  If you are unable to arrive 30 min prior to your appointment time we may have to cancel or reschedule you. LAPAROSCOPIC SURGERY: POST OP INSTRUCTIONS Always review your discharge instruction sheet given to you by the facility where your surgery was performed. IF YOU HAVE DISABILITY OR FAMILY LEAVE FORMS, YOU MUST BRING THEM TO THE OFFICE FOR PROCESSING.   DO NOT GIVE THEM TO YOUR DOCTOR.  PAIN CONTROL  1. First take acetaminophen (Tylenol) AND/or ibuprofen (Advil) to control your pain after surgery.  Follow directions on package.  Taking acetaminophen (Tylenol) and/or ibuprofen (Advil) regularly after surgery will help to control your pain and lower the amount of prescription pain medication you may need.  You should not take more than 4,000 mg (4 grams) of acetaminophen (Tylenol) in 24 hours.  You should not take ibuprofen (Advil), aleve, motrin, naprosyn or other NSAIDS if you have a history of stomach ulcers or chronic kidney disease.  2. A prescription for pain medication may be given to you upon discharge.  Take your pain medication as prescribed, if you still have uncontrolled pain after taking acetaminophen (Tylenol) or ibuprofen (Advil). 3. Use ice packs to help control pain. 4. If you need a refill on your pain medication, please contact your pharmacy.  They will contact our office to request authorization. Prescriptions will not be filled after 5pm or on week-ends.  HOME MEDICATIONS 5. Take your usually prescribed medications unless otherwise directed.  DIET 6. You should follow a light diet the first few days after arrival home.  Be sure to include lots of fluids daily. Avoid fatty, fried foods.   CONSTIPATION 7. It is common to experience some constipation after surgery and if you are taking pain medication.  Increasing fluid intake and taking a stool  softener (such as Colace) will usually help or prevent this problem from occurring.  A mild laxative (Milk of Magnesia or Miralax) should be taken according to package instructions if there are no bowel movements after 48 hours.  WOUND/INCISION CARE 8. Most patients will experience some swelling and bruising in the area of the incisions.  Ice packs will help.  Swelling and bruising can take several days to resolve.  9. Unless discharge instructions indicate otherwise, follow guidelines below  a. STERI-STRIPS - you may remove your outer bandages 48 hours after surgery, and you may shower at that time.  You have steri-strips (small skin tapes) in place directly over the incision.  These strips should be left on the skin for 7-10 days.   b. DERMABOND/SKIN GLUE - you may shower in 24 hours.  The glue will flake off over the next 2-3 weeks. 10. Any sutures or staples will be removed at the office during your follow-up visit.  ACTIVITIES 11. You may resume regular (light) daily activities beginning the next day--such as daily self-care, walking, climbing stairs--gradually increasing activities as tolerated.  You may have sexual intercourse when it is comfortable.  Refrain from any heavy lifting or straining until approved by your doctor. a. You may drive when you are no longer taking prescription pain medication, you can comfortably wear a seatbelt, and you can safely maneuver your car and apply brakes.  FOLLOW-UP 12. You should see your doctor in the office for a follow-up appointment approximately 2-3 weeks after your surgery.  You should have been given your post-op/follow-up appointment when   your surgery was scheduled.  If you did not receive a post-op/follow-up appointment, make sure that you call for this appointment within a day or two after you arrive home to insure a convenient appointment time.   WHEN TO CALL YOUR DOCTOR: 1. Fever over 101.0 2. Inability to urinate 3. Continued bleeding from  incision. 4. Increased pain, redness, or drainage from the incision. 5. Increasing abdominal pain  The clinic staff is available to answer your questions during regular business hours.  Please don't hesitate to call and ask to speak to one of the nurses for clinical concerns.  If you have a medical emergency, go to the nearest emergency room or call 911.  A surgeon from [facility] is always on call at the hospital. [address], [address], [place], [place]  [address] ? [address] [address], [place], [place]   [address] [phone] ? [phone] ? FAX [phone]  .........   Managing Your Pain After Surgery Without Opioids    Thank you for participating in our program to help patients manage their pain after surgery without opioids. This is part of our effort to provide you with the best care possible, without exposing you or your family to the risk that opioids pose.  What pain can I expect after surgery? You can expect to have some pain after surgery. This is normal. The pain is typically worse the day after surgery, and quickly begins to get better. Many studies have found that many patients are able to manage their pain after surgery with Over-the-Counter (OTC) medications such as Tylenol and Motrin. If you have a condition that does not allow you to take Tylenol or Motrin, notify your surgical team.  How will I manage my pain? The best strategy for controlling your pain after surgery is around the clock pain control with Tylenol (acetaminophen) and Motrin (ibuprofen or Advil). Alternating these medications with each other allows you to maximize your pain control. In addition to Tylenol and Motrin, you can use heating pads or ice packs on your incisions to help reduce your pain.  How will I alternate your regular strength over-the-counter pain medication? You will take a dose of pain medication every three hours. ; Start by taking 650 mg of Tylenol (2 pills of 325  mg) ; 3 hours later take 600 mg of Motrin (3 pills of 200 mg) ; 3 hours after taking the Motrin take 650 mg of Tylenol ; 3 hours after that take 600 mg of Motrin.   - 1 -  See example - if your first dose of Tylenol is at 12:00 PM   12:00 PM Tylenol 650 mg (2 pills of 325 mg)  3:00 PM Motrin 600 mg (3 pills of 200 mg)  6:00 PM Tylenol 650 mg (2 pills of 325 mg)  9:00 PM Motrin 600 mg (3 pills of 200 mg)  Continue alternating every 3 hours   We recommend that you follow this schedule around-the-clock for at least 3 days after surgery, or until you feel that it is no longer needed. Use the table on the last page of this handout to keep track of the medications you are taking. Important: Do not take more than 3000mg of Tylenol or 3200mg of Motrin in a 24-hour period. Do not take ibuprofen/Motrin if you have a history of bleeding stomach ulcers, severe kidney disease, &/or actively taking a blood thinner  What if I still have pain? If you have pain that is not   controlled with the over-the-counter pain medications (Tylenol and Motrin or Advil) you might have what we call "breakthrough" pain. You will receive a prescription for a small amount of an opioid pain medication such as Oxycodone, Tramadol, or Tylenol with Codeine. Use these opioid pills in the first 24 hours after surgery if you have breakthrough pain. Do not take more than 1 pill every 4-6 hours.  If you still have uncontrolled pain after using all opioid pills, don't hesitate to call our staff using the number provided. We will help make sure you are managing your pain in the best way possible, and if necessary, we can provide a prescription for additional pain medication.   Day 1    Time  Name of Medication Number of pills taken  Amount of Acetaminophen  Pain Level   Comments  AM PM       AM PM       AM PM       AM PM       AM PM       AM PM       AM PM       AM PM       Total Daily amount of Acetaminophen Do not  take more than  3,000 mg per day      Day 2    Time  Name of Medication Number of pills taken  Amount of Acetaminophen  Pain Level   Comments  AM PM       AM PM       AM PM       AM PM       AM PM       AM PM       AM PM       AM PM       Total Daily amount of Acetaminophen Do not take more than  3,000 mg per day      Day 3    Time  Name of Medication Number of pills taken  Amount of Acetaminophen  Pain Level   Comments  AM PM       AM PM       AM PM       AM PM          AM PM       AM PM       AM PM       AM PM       Total Daily amount of Acetaminophen Do not take more than  3,000 mg per day      Day 4    Time  Name of Medication Number of pills taken  Amount of Acetaminophen  Pain Level   Comments  AM PM       AM PM       AM PM       AM PM       AM PM       AM PM       AM PM       AM PM       Total Daily amount of Acetaminophen Do not take more than  3,000 mg per day      Day 5    Time  Name of Medication Number of pills taken  Amount of Acetaminophen  Pain Level   Comments  AM PM       AM PM       AM   PM       AM PM       AM PM       AM PM       AM PM       AM PM       Total Daily amount of Acetaminophen Do not take more than  3,000 mg per day       Day 6    Time  Name of Medication Number of pills taken  Amount of Acetaminophen  Pain Level  Comments  AM PM       AM PM       AM PM       AM PM       AM PM       AM PM       AM PM       AM PM       Total Daily amount of Acetaminophen Do not take more than  3,000 mg per day      Day 7    Time  Name of Medication Number of pills taken  Amount of Acetaminophen  Pain Level   Comments  AM PM       AM PM       AM PM       AM PM       AM PM       AM PM       AM PM       AM PM       Total Daily amount of Acetaminophen Do not take more than  3,000 mg per day        For additional information about how and where to safely dispose of unused  opioid medications - https://www.morepowerfulnc.org  Disclaimer: This document contains information and/or instructional materials adapted from [facility] for the typical patient with your condition. It does not replace medical advice from your health care provider because your experience may differ from that of the typical patient. Talk to your health care provider if you have any questions about this document, your condition or your treatment plan. Adapted from [facility]   

## 2020-08-28 ENCOUNTER — Encounter (HOSPITAL_COMMUNITY): Payer: Self-pay | Admitting: General Surgery

## 2020-08-28 LAB — CBC WITH DIFFERENTIAL/PLATELET
Abs Immature Granulocytes: 0.09 10*3/uL — ABNORMAL HIGH (ref 0.00–0.07)
Basophils Absolute: 0 10*3/uL (ref 0.0–0.1)
Basophils Relative: 0 %
Eosinophils Absolute: 0 10*3/uL (ref 0.0–0.5)
Eosinophils Relative: 0 %
HCT: 33.2 % — ABNORMAL LOW (ref 36.0–46.0)
Hemoglobin: 11 g/dL — ABNORMAL LOW (ref 12.0–15.0)
Immature Granulocytes: 1 %
Lymphocytes Relative: 23 %
Lymphs Abs: 3.8 10*3/uL (ref 0.7–4.0)
MCH: 32.4 pg (ref 26.0–34.0)
MCHC: 33.1 g/dL (ref 30.0–36.0)
MCV: 97.9 fL (ref 80.0–100.0)
Monocytes Absolute: 1.2 10*3/uL — ABNORMAL HIGH (ref 0.1–1.0)
Monocytes Relative: 7 %
Neutro Abs: 11.2 10*3/uL — ABNORMAL HIGH (ref 1.7–7.7)
Neutrophils Relative %: 69 %
Platelets: 239 10*3/uL (ref 150–400)
RBC: 3.39 MIL/uL — ABNORMAL LOW (ref 3.87–5.11)
RDW: 13.5 % (ref 11.5–15.5)
WBC: 16.4 10*3/uL — ABNORMAL HIGH (ref 4.0–10.5)
nRBC: 0 % (ref 0.0–0.2)

## 2020-08-28 LAB — COMPREHENSIVE METABOLIC PANEL
ALT: 24 U/L (ref 0–44)
AST: 26 U/L (ref 15–41)
Albumin: 3.2 g/dL — ABNORMAL LOW (ref 3.5–5.0)
Alkaline Phosphatase: 49 U/L (ref 38–126)
Anion gap: 10 (ref 5–15)
BUN: 11 mg/dL (ref 6–20)
CO2: 23 mmol/L (ref 22–32)
Calcium: 8.9 mg/dL (ref 8.9–10.3)
Chloride: 106 mmol/L (ref 98–111)
Creatinine, Ser: 0.85 mg/dL (ref 0.44–1.00)
GFR, Estimated: >60 mL/min (ref >60)
Glucose, Bld: 117 mg/dL — ABNORMAL HIGH (ref 70–99)
Potassium: 3.7 mmol/L (ref 3.5–5.1)
Sodium: 139 mmol/L (ref 135–145)
Total Bilirubin: 0.9 mg/dL (ref 0.3–1.2)
Total Protein: 5.4 g/dL — ABNORMAL LOW (ref 6.5–8.1)

## 2020-08-28 LAB — GLUCOSE, CAPILLARY: Glucose-Capillary: 119 mg/dL — ABNORMAL HIGH (ref 70–99)

## 2020-08-28 LAB — SURGICAL PATHOLOGY

## 2020-08-28 MED ORDER — OXYCODONE HCL 5 MG PO TABS: 5.0000 mg | ORAL_TABLET | Freq: Four times a day (QID) | ORAL | 0 refills | Status: AC | PRN

## 2020-08-28 MED ORDER — ACETAMINOPHEN 500 MG PO TABS
1000.0000 mg | ORAL_TABLET | Freq: Four times a day (QID) | ORAL | 0 refills | Status: DC | PRN
Start: 2020-08-28 — End: 2020-11-29

## 2020-08-28 MED ORDER — LIDOCAINE 5 % EX PTCH
1.0000 | MEDICATED_PATCH | CUTANEOUS | Status: DC
  Administered 2020-08-28: 1 via TRANSDERMAL
  Filled 2020-08-28: qty 1

## 2020-08-28 MED ORDER — LIDOCAINE 5 % EX PTCH: 1.0000 | MEDICATED_PATCH | CUTANEOUS | 0 refills | Status: AC

## 2020-08-28 NOTE — Progress Notes (Signed)
[facility] Progress Note  1 Day Post-Op  Subjective: CC:  NAEO. Reports abdominal soreness with movement. Tolerating PO. Denies dizziness or lightheadedness with mobilization. No reported urinary sxs. Denies BM since surgery.  Objective: Vital signs in last 24 hours: Temp:  [98.1 F (36.7 C)-98.9 F (37.2 C)] 98.1 F (36.7 C) (11/30 0555) Pulse Rate:  [43-59] 43 (11/30 0555) Resp:  [15-18] 15 (11/30 0555) BP: (105-132)/(48-77) 114/68 (11/30 0555) SpO2:  [94 %-100 %] 100 % (11/30 0555) Last BM Date: 08/26/20  Intake/Output from previous day: 11/29 0701 - 11/30 0700 In: 1510 [P.O.:810; I.V.:700] Out: 25 [Blood:25] Intake/Output this shift: No intake/output data recorded.  PE:  Gen:  Alert, NAD, pleasant Pulm:  Normal effort ORA Abd: Soft, appropriately tender, non-distended, bowel sounds present, incisions C/D/I Skin: warm and dry, no rashes  Psych: A&Ox3   Lab Results:  Recent Labs    08/26/20 1743 08/28/20 0312  WBC 15.2* 16.4*  HGB 12.8 11.0*  HCT 38.4 33.2*  PLT 326 239   BMET Recent Labs    08/26/20 1743 08/28/20 0312  NA 142 139  K 3.9 3.7  CL 110 106  CO2 22 23  GLUCOSE 107* 117*  BUN 15 11  CREATININE 0.78 0.85  CALCIUM 9.8 8.9   PT/INR No results for input(s): LABPROT, INR in the last 72 hours. CMP     Component Value Date/Time   NA 139 08/28/2020 0312   K 3.7 08/28/2020 0312   CL 106 08/28/2020 0312   CO2 23 08/28/2020 0312   GLUCOSE 117 (H) 08/28/2020 0312   BUN 11 08/28/2020 0312   CREATININE 0.85 08/28/2020 0312   CALCIUM 8.9 08/28/2020 0312   PROT 5.4 (L) 08/28/2020 0312   ALBUMIN 3.2 (L) 08/28/2020 0312   AST 26 08/28/2020 0312   ALT 24 08/28/2020 0312   ALKPHOS 49 08/28/2020 0312   BILITOT 0.9 08/28/2020 0312   GFRNONAA >60 08/28/2020 0312   GFRAA >60 07/23/2018 0917   Lipase     Component Value Date/Time   LIPASE 19 08/26/2020 1743       Studies/Results: CT ABDOMEN PELVIS W CONTRAST  Result Date:  08/27/2020 CLINICAL DATA:  Right flank pain EXAM: CT ABDOMEN AND PELVIS WITH CONTRAST TECHNIQUE: Multidetector CT imaging of the abdomen and pelvis was performed using the standard protocol following bolus administration of intravenous contrast. CONTRAST:  OMNIPAQUE IOHEXOL 300 MG/ML  SOLN COMPARISON:  07/11/2015 FINDINGS: LOWER CHEST: Normal. HEPATOBILIARY: Normal hepatic contours. No intra- or extrahepatic biliary dilatation. Distended gallbladder with moderate wall thickening. PANCREAS: Normal pancreas. No ductal dilatation or peripancreatic fluid collection. SPLEEN: Normal. ADRENALS/URINARY TRACT: The adrenal glands are normal. No hydronephrosis, nephroureterolithiasis or solid renal mass. The urinary bladder is normal for degree of distention STOMACH/BOWEL: There is no hiatal hernia. Normal duodenal course and caliber. No small bowel dilatation or inflammation. No focal colonic abnormality. Normal appendix. VASCULAR/LYMPHATIC: Normal course and caliber of the major abdominal vessels. No abdominal or pelvic lymphadenopathy. REPRODUCTIVE: Normal uterus. No adnexal mass. Small amount of fluid in the pelvis. MUSCULOSKELETAL. No bony spinal canal stenosis or focal osseous abnormality. OTHER: None. IMPRESSION: Distended gallbladder with moderate wall thickening, possibly indicating acute cholecystitis. Electronically Signed   By: Deatra Robinson M.D.   On: 08/27/2020 00:48   US Abdomen Limited RUQ (LIVER/GB)  Result Date: 08/27/2020 CLINICAL DATA:  Right upper quadrant pain EXAM: ULTRASOUND ABDOMEN LIMITED RIGHT UPPER QUADRANT COMPARISON:  CT from earlier in the same day. FINDINGS: Gallbladder: Gallbladder is  well distended. Wall is thickened at 7.7 mm. Pericholecystic fluid is noted as well. These changes are highly suggestive of acute cholecystitis despite the lack of gallstones or positive sonographic Murphy's sign. Patient had been medicated prior to the ultrasound exam. Common bile duct: Diameter: 3.1  mm. Liver: No focal lesion identified. Within normal limits in parenchymal echogenicity. Portal vein is patent on color Doppler imaging with normal direction of blood flow towards the liver. Other: None. IMPRESSION: Changes consistent with acute cholecystitis. Electronically Signed   By: Alcide Clever M.D.   On: 08/27/2020 02:21    Anti-infectives: Anti-infectives (From admission, onward)   Start     Dose/Rate Route Frequency Ordered Stop   08/27/20 1200  piperacillin-tazobactam (ZOSYN) IVPB 3.375 g  Status:  Discontinued        3.375 g 12.5 mL/hr over 240 Minutes Intravenous Every 8 hours 08/27/20 0409 08/27/20 1106   08/27/20 0415  piperacillin-tazobactam (ZOSYN) IVPB 3.375 g        3.375 g 100 mL/hr over 30 Minutes Intravenous STAT 08/27/20 0409 08/27/20 0453   08/27/20 0300  cefTRIAXone (ROCEPHIN) 2 g in sodium chloride 0.9 % 100 mL IVPB        2 g 200 mL/hr over 30 Minutes Intravenous  Once 08/27/20 0245 08/27/20 0359       Assessment/Plan Acute cholecystitis  S/p laparoscopic cholecystectomy 08/27/20 Dr. Dwain Sarna  - afebrile, VSS, WBC 16.4 from 15.2 not unexpected post-op - tolerating PO, mobilizing, no reported urinary sxs, patient is stable for discharge from surgical perspective - follow up and pain medication provided.    LOS: 1 day    Hosie Spangle, [facility] Surgery Please see Amion for pager number during day hours 7:00am-4:30pm

## 2020-08-28 NOTE — Progress Notes (Signed)
Called from Tele that patient is Sinusbrady below 40's. Writer went to check on patient. Patient is alert and verbal, HR regular at 41bpm. Asymptomatic. Paged B. Jon Billings, MD at [phone]. MDcalled to continue monitor patient, & check Blood Sugar. CBG 119

## 2020-08-28 NOTE — Discharge Summary (Signed)
Physician Discharge Summary  Brooke Carrillo ZOX:[id number] DOB: 1986-04-21 DOA: 08/26/2020  PCP: Patient, No Pcp Per  Admit date: 08/26/2020 Discharge date: 08/28/2020  Recommendations for Outpatient Follow-up:  1. Discharge to home 2. Follow up with PCP in 7-10 days. 3. Follow up with general surgery as directed.   Follow-up Information    [facility], [facility] Follow up on 09/18/2020.   Specialty: General Surgery Why: 9:00am, arrive by 8:30am for paperwork and check in process Contact information: [address] [place] [address] [phone]              Discharge Diagnoses: Principal diagnosis is #1 1. Acute cholecystitis 2. Sinus bradycardia - asymptomatic 3. Tobacco abuse   Discharge Condition: Fair  Disposition: Home  Diet recommendation: Regular  Filed Weights   08/27/20 0849  Weight: 77.1 kg    History of present illness: Brooke Sherene SiresM Carrillo is a 34 y.o. female with no significant past medical history presents to the ER with complaints of abdominal pain for the last 48 hours.  Pain is mostly in the right upper quadrant no associated nausea vomiting diarrhea fever chills or chest pain.  Patient has had a recent neck pain for which patient was started on steroids and patient has taken only 1 dose.  At this time denies any neck pain.  Denies any weakness of extremities.  ED Course: In the ER patient was found to be sinus bradycardia.  Heart rate is around 44 bpm but blood pressure was stable.  On exam patient has significant right upper quadrant tenderness.  CT abdomen pelvis and ultrasound of the abdomen were done which shows features concerning for acute cholecystitis.  Labs show leukocytosis of 15,000 Covid test negative urine pregnancy negative.  LFTs were normal.  Patient admitted for further management of acute cholecystitis and sinus bradycardia.  Hospital Course: Brooke JoDeana M Reidis a 34 y.o.femalewithno significant past medical history presents  to the ER with complaints of abdominal pain for the last 48 hours. Pain is mostly in the right upper quadrant no associated nausea vomiting diarrhea fever chills or chest pain. Patient has had a recent neck pain for which patient was started on steroids and patient has taken only 1 dose. At this time denies any neck pain. Denies any weakness of extremities.  In the ER patient was found to be sinus bradycardia. Heart rate is around 44 bpm but blood pressure was stable. On exam patient has significant right upper quadrant tenderness. CT abdomen pelvis and ultrasound of the abdomen were done which shows features concerning for acute cholecystitis. Labs show leukocytosis of 15,000 Covid test negative urine pregnancy negative. LFTs were normal. Patient admitted for further management of acute cholecystitis and sinus bradycardia.  General Surgery was consulted and the patient was taken for cholecystectomy this morning. She has tolerated the procedure well, although she is having significant pain.  Today the patient was able to tolerate a regular diet for lunch. Her pain is still present, but is better controlled.   General surgery has cleared her for discharge to home.  Today's assessment: S: The patient is resting comfortably. No new complaints. O: Vitals:  Vitals:   08/28/20 1453 08/28/20 1505  BP: (!) 88/44 (!) 113/48  Pulse:  (!) 59  Resp:  18  Temp:  97.8 F (36.6 C)  SpO2:  100%   Exam:  Constitutional:  . The patient is awake, alert, and oriented x 3. No acute distress. Respiratory:  . No increased  work of breathing. . No wheezes, rales, or rhonchi . No tactile fremitus Cardiovascular:  . Regular rate and rhythm . No murmurs, ectopy, or gallups. . No lateral PMI. No thrills. Abdomen:  . Abdomen is soft, slightly tender and distended . No hernias, masses, or organomegaly . Normoactive bowel sounds.  Musculoskeletal:  . No cyanosis, clubbing, or edema Skin:  . No  rashes, lesions, ulcers . palpation of skin: no induration or nodules Neurologic:  . CN 2-12 intact . Sensation all 4 extremities intact Psychiatric:  . Mental status o Mood, affect appropriate o Orientation to person, place, time  . judgment and insight appear intact  Discharge Instructions  Discharge Instructions    Activity as tolerated - No restrictions   Complete by: As directed    Call MD for:  persistant nausea and vomiting   Complete by: As directed    Call MD for:  redness, tenderness, or signs of infection (pain, swelling, redness, odor or green/yellow discharge around incision site)   Complete by: As directed    Call MD for:  severe uncontrolled pain   Complete by: As directed    Diet - low sodium heart healthy   Complete by: As directed    Discharge instructions   Complete by: As directed    Discharge to home Follow up with PCP in 7-10 days. Follow up with general surgery as directed.   Increase activity slowly   Complete by: As directed    No wound care   Complete by: As directed      Allergies as of 08/28/2020   No Known Allergies     Medication List    STOP taking these medications   HYDROcodone-acetaminophen 7.5-325 MG tablet Commonly known as: Norco   ibuprofen 200 MG tablet Commonly known as: ADVIL   methylPREDNISolone 4 MG Tbpk tablet Commonly known as: MEDROL DOSEPAK   tiZANidine 4 MG tablet Commonly known as: Zanaflex     TAKE these medications   acetaminophen 500 MG tablet Commonly known as: TYLENOL Take 2 tablets (1,000 mg total) by mouth every 6 (six) hours as needed.   aspirin-acetaminophen-caffeine 250-250-65 MG tablet Commonly known as: EXCEDRIN MIGRAINE Take 2 tablets by mouth every 6 (six) hours as needed for headache.   lidocaine 5 % Commonly known as: LIDODERM Place 1 patch onto the skin daily for 7 days. Remove & Discard patch within 12 hours or as directed by MD   oxyCODONE 5 MG immediate release tablet Commonly  known as: Oxy IR/ROXICODONE Take 1 tablet (5 mg total) by mouth every 6 (six) hours as needed for up to 5 days for moderate pain (no relieved by tylenol or lidoderm patch).      No Known Allergies  The results of significant diagnostics from this hospitalization (including imaging, microbiology, ancillary and laboratory) are listed below for reference.    Significant Diagnostic Studies: CT ABDOMEN PELVIS W CONTRAST  Result Date: 08/27/2020 CLINICAL DATA:  Right flank pain EXAM: CT ABDOMEN AND PELVIS WITH CONTRAST TECHNIQUE: Multidetector CT imaging of the abdomen and pelvis was performed using the standard protocol following bolus administration of intravenous contrast. CONTRAST:  OMNIPAQUE IOHEXOL 300 MG/ML  SOLN COMPARISON:  07/11/2015 FINDINGS: LOWER CHEST: Normal. HEPATOBILIARY: Normal hepatic contours. No intra- or extrahepatic biliary dilatation. Distended gallbladder with moderate wall thickening. PANCREAS: Normal pancreas. No ductal dilatation or peripancreatic fluid collection. SPLEEN: Normal. ADRENALS/URINARY TRACT: The adrenal glands are normal. No hydronephrosis, nephroureterolithiasis or solid renal mass. The urinary bladder is  normal for degree of distention STOMACH/BOWEL: There is no hiatal hernia. Normal duodenal course and caliber. No small bowel dilatation or inflammation. No focal colonic abnormality. Normal appendix. VASCULAR/LYMPHATIC: Normal course and caliber of the major abdominal vessels. No abdominal or pelvic lymphadenopathy. REPRODUCTIVE: Normal uterus. No adnexal mass. Small amount of fluid in the pelvis. MUSCULOSKELETAL. No bony spinal canal stenosis or focal osseous abnormality. OTHER: None. IMPRESSION: Distended gallbladder with moderate wall thickening, possibly indicating acute cholecystitis. Electronically Signed   By: Deatra Robinson M.D.   On: 08/27/2020 00:48   US Abdomen Limited RUQ (LIVER/GB)  Result Date: 08/27/2020 CLINICAL DATA:  Right upper quadrant  pain EXAM: ULTRASOUND ABDOMEN LIMITED RIGHT UPPER QUADRANT COMPARISON:  CT from earlier in the same day. FINDINGS: Gallbladder: Gallbladder is well distended. Wall is thickened at 7.7 mm. Pericholecystic fluid is noted as well. These changes are highly suggestive of acute cholecystitis despite the lack of gallstones or positive sonographic Murphy's sign. Patient had been medicated prior to the ultrasound exam. Common bile duct: Diameter: 3.1 mm. Liver: No focal lesion identified. Within normal limits in parenchymal echogenicity. Portal vein is patent on color Doppler imaging with normal direction of blood flow towards the liver. Other: None. IMPRESSION: Changes consistent with acute cholecystitis. Electronically Signed   By: Alcide Clever M.D.   On: 08/27/2020 02:21    Microbiology: Recent Results (from the past 240 hour(s))  Resp Panel by RT-PCR (Flu A&B, Covid) Nasopharyngeal Swab     Status: None   Collection Time: 08/27/20  3:12 AM   Specimen: Nasopharyngeal Swab; Nasopharyngeal(NP) swabs in vial transport medium  Result Value Ref Range Status   SARS Coronavirus 2 by RT PCR NEGATIVE NEGATIVE Final    Comment: (NOTE) SARS-CoV-2 target nucleic acids are NOT DETECTED.  The SARS-CoV-2 RNA is generally detectable in upper respiratory specimens during the acute phase of infection. The lowest concentration of SARS-CoV-2 viral copies this assay can detect is 138 copies/mL. A negative result does not preclude SARS-Cov-2 infection and should not be used as the sole basis for treatment or other patient management decisions. A negative result may occur with  improper specimen collection/handling, submission of specimen other than nasopharyngeal swab, presence of viral mutation(s) within the areas targeted by this assay, and inadequate number of viral copies(<138 copies/mL). A negative result must be combined with clinical observations, patient history, and epidemiological information. The expected  result is Negative.  Fact Sheet for Patients:  BloggerCourse.com  Fact Sheet for Healthcare Providers:  SeriousBroker.it  This test is no t yet approved or cleared by the [place] FDA and  has been authorized for detection and/or diagnosis of SARS-CoV-2 by FDA under an Emergency Use Authorization (EUA). This EUA will remain  in effect (meaning this test can be used) for the duration of the COVID-19 declaration under Section 564(b)(1) of the Act, 21 U.S.C.section 360bbb-3(b)(1), unless the authorization is terminated  or revoked sooner.       Influenza A by PCR NEGATIVE NEGATIVE Final   Influenza B by PCR NEGATIVE NEGATIVE Final    Comment: (NOTE) The Xpert Xpress SARS-CoV-2/FLU/RSV plus assay is intended as an aid in the diagnosis of influenza from Nasopharyngeal swab specimens and should not be used as a sole basis for treatment. Nasal washings and aspirates are unacceptable for Xpert Xpress SARS-CoV-2/FLU/RSV testing.  Fact Sheet for Patients: BloggerCourse.com  Fact Sheet for Healthcare Providers: SeriousBroker.it  This test is not yet approved or cleared by the [place] FDA and has been  authorized for detection and/or diagnosis of SARS-CoV-2 by FDA under an Emergency Use Authorization (EUA). This EUA will remain in effect (meaning this test can be used) for the duration of the COVID-19 declaration under Section 564(b)(1) of the Act, 21 U.S.C. section 360bbb-3(b)(1), unless the authorization is terminated or revoked.  Performed at [facility], [address], [place], [place] [address]      Labs: Basic Metabolic Panel: Recent Labs  Lab 08/26/20 1743 08/28/20 0312  NA 142 139  K 3.9 3.7  CL 110 106  CO2 22 23  GLUCOSE 107* 117*  BUN 15 11  CREATININE 0.78 0.85  CALCIUM 9.8 8.9   Liver Function Tests: Recent Labs  Lab 08/26/20 1743  08/28/20 0312  AST 16 26  ALT 14 24  ALKPHOS 54 49  BILITOT 0.6 0.9  PROT 6.7 5.4*  ALBUMIN 3.9 3.2*   Recent Labs  Lab 08/26/20 1743  LIPASE 19   No results for input(s): AMMONIA in the last 168 hours. CBC: Recent Labs  Lab 08/26/20 1743 08/28/20 0312  WBC 15.2* 16.4*  NEUTROABS  --  11.2*  HGB 12.8 11.0*  HCT 38.4 33.2*  MCV 98.0 97.9  PLT 326 239   Cardiac Enzymes: No results for input(s): CKTOTAL, CKMB, CKMBINDEX, TROPONINI in the last 168 hours. BNP: BNP (last 3 results) No results for input(s): BNP in the last 8760 hours.  ProBNP (last 3 results) No results for input(s): PROBNP in the last 8760 hours.  CBG: Recent Labs  Lab 08/28/20 0304  GLUCAP 119*    Principal Problem:   Acute cholecystitis Active Problems:   Sinus bradycardia   Time coordinating discharge: 38 minutes.  Signed:        Adaya Garmany, DO [facility]  08/28/2020, 5:23 PM

## 2020-08-28 NOTE — Progress Notes (Signed)
[name] to be D/C'd per MD order. Discussed with the patient and all questions fully answered. ? VSS, Skin clean, dry and intact without evidence of skin break down, no evidence of skin tears noted. ? IV catheter discontinued intact. Site without signs and symptoms of complications. Dressing and pressure applied. ? An After Visit Summary was printed and given to the patient. Patient informed where to pickup prescriptions. ? D/c education completed with patient/family including follow up instructions, medication list, d/c activities limitations if indicated, with other d/c instructions as indicated by MD - patient able to verbalize understanding, all questions fully answered.  ? Patient instructed to return to ED, call 911, or call MD for any changes in condition.  ? Patient to be escorted via WC, and D/C home via private auto.

## 2020-08-29 NOTE — Progress Notes (Signed)
   08/28/20 1505  Assess: MEWS Score  Temp 97.8 F (36.6 C)  BP (!) 113/48  Pulse Rate (!) 59  Resp 18  SpO2 100 %  O2 Device Room Air  Assess: MEWS Score  MEWS Temp 0  MEWS Systolic 0  MEWS Pulse 0  MEWS RR 0  MEWS LOC 0  MEWS Score 0  MEWS Score Color Green  Assess: if the MEWS score is Yellow or Red  Were vital signs taken at a resting state? Yes  Focused Assessment No change from prior assessment  Early Detection of Sepsis Score *See Row Information* Low  MEWS guidelines implemented *See Row Information* No, vital signs rechecked  Document  Patient Outcome Stabilized after interventions  Progress note created (see row info) Yes   No interventions needed at this time. Vitals rechecked and patient is a yellow MEWS. NAD. VSS.

## 2020-08-30 NOTE — Anesthesia Postprocedure Evaluation (Signed)
Anesthesia Post Note  Patient: BRYANNE RIQUELME  Procedure(s) Performed: LAPAROSCOPIC CHOLECYSTECTOMY (N/A Abdomen)     Patient location during evaluation: PACU Anesthesia Type: General Level of consciousness: awake and alert Pain management: pain level controlled Vital Signs Assessment: post-procedure vital signs reviewed and stable Respiratory status: spontaneous breathing, nonlabored ventilation, respiratory function stable and patient connected to nasal cannula oxygen Cardiovascular status: blood pressure returned to baseline and stable Postop Assessment: no apparent nausea or vomiting Anesthetic complications: no   No complications documented.  Last Vitals:  Vitals:   08/28/20 1453 08/28/20 1505  BP: (!) 88/44 (!) 113/48  Pulse:  (!) 59  Resp:  18  Temp:  36.6 C  SpO2:  100%    Last Pain:  Vitals:   08/28/20 1525  TempSrc:   PainSc: 6                  Dinah Lupa

## 2020-10-24 ENCOUNTER — Ambulatory Visit (INDEPENDENT_AMBULATORY_CARE_PROVIDER_SITE_OTHER): Payer: Medicaid Other

## 2020-10-24 ENCOUNTER — Ambulatory Visit (HOSPITAL_COMMUNITY)
Admission: EM | Admit: 2020-10-24 | Discharge: 2020-10-24 | Disposition: A | Discharge status: Home / Self Care | Payer: Medicaid Other | Attending: Emergency Medicine | Admitting: Emergency Medicine

## 2020-10-24 ENCOUNTER — Other Ambulatory Visit: Payer: Self-pay

## 2020-10-24 ENCOUNTER — Encounter (HOSPITAL_COMMUNITY): Payer: Self-pay | Admitting: *Deleted

## 2020-10-24 DIAGNOSIS — R079 Chest pain, unspecified: Secondary | ICD-10-CM

## 2020-10-24 DIAGNOSIS — M5412 Radiculopathy, cervical region: Secondary | ICD-10-CM

## 2020-10-24 MED ORDER — IBUPROFEN 600 MG PO TABS: 600.0000 mg | ORAL_TABLET | Freq: Four times a day (QID) | ORAL | 0 refills | Status: DC | PRN

## 2020-10-24 MED ORDER — FAMOTIDINE 20 MG PO TABS: 20.0000 mg | ORAL_TABLET | Freq: Two times a day (BID) | ORAL | 0 refills | Status: DC

## 2020-10-24 MED ORDER — METHYLPREDNISOLONE 4 MG PO TBPK: ORAL_TABLET | Freq: Every day | ORAL | 0 refills | Status: DC

## 2020-10-24 MED ORDER — TIZANIDINE HCL 4 MG PO TABS: 4.0000 mg | ORAL_TABLET | Freq: Three times a day (TID) | ORAL | 0 refills | Status: DC | PRN

## 2020-10-24 NOTE — ED Provider Notes (Signed)
HPI  SUBJECTIVE:  Brooke Carrillo is a 35 y.o. female who presents with 2 issues:  First, She reports intermittent, minutes long substernal central chest pain lasting minutes starting 2 days ago.  She describes it as "squeezing".  She denies nausea, diaphoresis, radiation up her neck, arm, through to her back.  No palpitations, shortness of breath.  No exertional or positional component.  No GERD symptoms.  No recent viral illness.  No calf pain, swelling, cough, surgery in the past 4 weeks, recent immobilization, exogenous estrogen.  It is not associated with arm movement, torso rotation.  No change in physical activity or trauma to the chest.  Symptoms are better with lying down.  No aggravating factors.  She has not tried anything for this.  Second, she reports continued right-sided neck pain that radiates down her right arm, hand swelling that is worse in the morning.  She reports numbness and tingling in her right hand.  This has been present and unchanged since November. patient was seen here 08/24/2020 with right-sided cervical radiculopathy, was sent home with Medrol dose pack, Zanaflex, Norco.  States that these helped somewhat.  Symptoms worse in the morning when she wakes up.    She smokes, has a history of GERD.  No history of diabetes, hypertension, coronary disease, hypercholesterolemia, coronary disease, DVT, PE.  Family history significant for dad with early MI.  She thinks that he may have had his initial MI in his 14s, but is not sure.  LMP: 12/28.  Denies possibility of being pregnant.  PMD: She has an appointment to establish care March 3 at [facility].  Past Medical History:  Diagnosis Date  . Headache   . Pregnancy as incidental finding     Past Surgical History:  Procedure Laterality Date  . CHOLECYSTECTOMY N/A 08/27/2020   Procedure: LAPAROSCOPIC CHOLECYSTECTOMY;  Surgeon: [name], MD;  Location[facility];  Service: General;  Laterality: N/A;  . LAPAROSCOPIC TUBAL  LIGATION Bilateral 07/23/2018   Procedure: LAPAROSCOPIC TUBAL LIGATION;  Surgeon: Hermina Staggers, MD;  Location: [facility] ORS;  Service: Gynecology;  Laterality: Bilateral;  . ORIF FINGER / THUMB FRACTURE    . WISDOM TOOTH EXTRACTION      Family History  Problem Relation Age of Onset  . Hypertension Father   . Stroke Father   . Heart disease Father   . Dementia Mother   . Cancer Maternal Aunt        Breast  . HIV Maternal Aunt   . Stroke Maternal Uncle   . Cancer Paternal Uncle        Colon  . Cancer Maternal Grandmother   . Hypertension Paternal Grandmother   . Cancer Paternal Grandfather        Colon    Social History   Tobacco Use  . Smoking status: Current Every Day Smoker    Packs/day: 0.50    Types: Cigarettes  . Smokeless tobacco: Never Used  Vaping Use  . Vaping Use: Former  Substance Use Topics  . Alcohol use: No  . Drug use: No    No current facility-administered medications for this encounter.  Current Outpatient Medications:  .  ibuprofen (ADVIL) 600 MG tablet, Take 1 tablet (600 mg total) by mouth every 6 (six) hours as needed., Disp: 30 tablet, Rfl: 0 .  methylPREDNISolone (MEDROL DOSEPAK) 4 MG TBPK tablet, Take by mouth daily. Follow package instructions, Disp: 21 tablet, Rfl: 0 .  tiZANidine (ZANAFLEX) 4 MG tablet, Take 1 tablet (4  mg total) by mouth every 8 (eight) hours as needed for muscle spasms., Disp: 30 tablet, Rfl: 0 .  acetaminophen (TYLENOL) 500 MG tablet, Take 2 tablets (1,000 mg total) by mouth every 6 (six) hours as needed., Disp: 30 tablet, Rfl: 0  No Known Allergies   ROS  As noted in HPI.   Physical Exam  BP 110/64 (BP Location: Right Arm)   Pulse 64   Temp 98.6 F (37 C) (Oral)   Resp 16   LMP 09/25/2020   SpO2 100%   Constitutional: Well developed, well nourished, no acute distress Eyes:  EOMI, conjunctiva normal bilaterally HENT: Normocephalic, atraumatic,mucus membranes moist Respiratory: Normal inspiratory effort,  lungs clear bilaterally. Cardiovascular: Normal rate regular rhythm no murmurs rubs or gallops.  No chest wall tenderness. GI: nondistended skin: No rash, skin intact Musculoskeletal: Calves symmetric, nontender, no edema No C-spine tenderness.  Positive right-sided trapezial tenderness.  Sensation, motor over entire right arm intact .  No color or temperature changes, hand swelling.  Grip strength 5/5 and equal bilaterally.  RP 2+. Neurologic: Alert & oriented x 3, no focal neuro deficits Psychiatric: Speech and behavior appropriate   ED Course   Medications - No data to display  Orders Placed This Encounter  Procedures  . DG Chest 2 View    Standing Status:   Standing    Number of Occurrences:   1    Order Specific Question:   Reason for Exam (SYMPTOM  OR DIAGNOSIS REQUIRED)    Answer:   central CP  . Ambulatory referral to Cardiology    Referral Priority:   Urgent    Referral Type:   Consultation    Referral Reason:   Specialty Services Required    Requested Specialty:   Cardiology    Number of Visits Requested:   1  . ED EKG    Standing Status:   Standing    Number of Occurrences:   1    Order Specific Question:   Reason for Exam    Answer:   Chest Pain    Order Specific Question:   Release to patient    Answer:   Immediate    No results found for this or any previous visit (from the past 24 hour(s)). DG Chest 2 View  Result Date: 10/24/2020 CLINICAL DATA:  35 year old female with chest pain. EXAM: CHEST - 2 VIEW COMPARISON:  Chest radiograph dated 08/18/2012 FINDINGS: The heart size and mediastinal contours are within normal limits. Both lungs are clear. The visualized skeletal structures are unremarkable. IMPRESSION: No active cardiopulmonary disease. Electronically Signed   By: Elgie Collard M.D.   On: 10/24/2020 19:31    ED Clinical Impression  1. Chest pain, unspecified type   2. Cervical radiculopathy      ED Assessment/Plan  Previous records reviewed.   As noted in HPI.  1.  Chest pain.  Checking EKG, chest x-ray.  Does not appear to be musculoskeletal as it is not reproducible.  No evidence of pericarditis on EKG.  Doubt myocarditis in the absence of recent viral illness, fevers.  EKG: Normal sinus rhythm, rate 66.  Normal axis, normal intervals.  No hypertrophy.  No ST-T wave changes.  No change compared to EKG from 08/27/2020.  Reviewed imaging independently.  Normal chest x-ray.  See radiology report for full details.  EKG reassuring, although she was asymptomatic while the EKG was obtained.  Vitals normal.  Chest x-ray normal.  Patient is PERC negative.  Doubt PE.  HEAR score 1.  Patient is low risk for MACE: 0.9 to 1.7%.  Chest pain of uncertain etiology-will refer to cardiology.  Strict ER return precautions given.  2.  Right-sided cervical radiculopathy.  Unchanged over the past 1-1/2 to 2 months.  Will try Medrol Dosepak, Zanaflex, Tylenol/ibuprofen.  Advised follow-up with [facility] or orthopedics.  Discussed imaging, MDM, treatment plan, and plan for follow-up with patient. Discussed sn/sx that should prompt return to the ED. patient agrees with plan.   Meds ordered this encounter  Medications  . tiZANidine (ZANAFLEX) 4 MG tablet    Sig: Take 1 tablet (4 mg total) by mouth every 8 (eight) hours as needed for muscle spasms.    Dispense:  30 tablet    Refill:  0  . ibuprofen (ADVIL) 600 MG tablet    Sig: Take 1 tablet (600 mg total) by mouth every 6 (six) hours as needed.    Dispense:  30 tablet    Refill:  0  . methylPREDNISolone (MEDROL DOSEPAK) 4 MG TBPK tablet    Sig: Take by mouth daily. Follow package instructions    Dispense:  21 tablet    Refill:  0    *This clinic note was created using Dragon dictation software. Therefore, there may be occasional mistakes despite careful proofreading.   ?    Domenick Gong, MD 10/24/20 2008

## 2020-10-24 NOTE — Discharge Instructions (Addendum)
Your chest x-ray and EKG were both normal.  I think that you are low risk for major cardiac event, your risk is between 0.9 to 1.7%.  However, I have ordered an urgent referral to cardiology for further evaluation. you could try some Pepcid in case this is acid reflux.    Go immediately to the ER if your chest pain changes, gets worse, or for any of the other signs and symptoms we discussed.  Try 600 mg ibuprofen combined with 1000 mg of Tylenol together 3-4 times a day as needed for pain.  The Zanaflex and Medrol Dosepak might also help your cervical radiculopathy.  Follow-up with [facility] or with Dr. August Saucer, orthopedics, whichever one can get you in first.

## 2020-10-24 NOTE — ED Triage Notes (Addendum)
Pt yawing while reported she had central CP and HA for 2 days. Pt reported she was seen  In November  the Rt arm pain and numbness. Pt was told pain and numbness was caused to a pinched nerve. Pt reports co may be due to a panic attack.

## 2020-10-30 ENCOUNTER — Ambulatory Visit: Payer: Medicaid Other | Admitting: Internal Medicine

## 2020-11-02 ENCOUNTER — Other Ambulatory Visit: Payer: Self-pay

## 2020-11-02 ENCOUNTER — Encounter (HOSPITAL_COMMUNITY): Payer: Self-pay

## 2020-11-02 ENCOUNTER — Emergency Department (HOSPITAL_COMMUNITY): Payer: Medicaid Other

## 2020-11-02 ENCOUNTER — Emergency Department (HOSPITAL_COMMUNITY)
Admission: EM | Admit: 2020-11-02 | Discharge: 2020-11-02 | Disposition: A | Discharge status: Home / Self Care | Payer: Medicaid Other | Attending: Emergency Medicine | Admitting: Emergency Medicine

## 2020-11-02 DIAGNOSIS — Z5321 Procedure and treatment not carried out due to patient leaving prior to being seen by health care provider: Secondary | ICD-10-CM | POA: Diagnosis not present

## 2020-11-02 DIAGNOSIS — R079 Chest pain, unspecified: Secondary | ICD-10-CM | POA: Insufficient documentation

## 2020-11-02 LAB — CBC
HCT: 41.9 % (ref 36.0–46.0)
Hemoglobin: 13.6 g/dL (ref 12.0–15.0)
MCH: 32.2 pg (ref 26.0–34.0)
MCHC: 32.5 g/dL (ref 30.0–36.0)
MCV: 99.1 fL (ref 80.0–100.0)
Platelets: 326 10*3/uL (ref 150–400)
RBC: 4.23 MIL/uL (ref 3.87–5.11)
RDW: 12.3 % (ref 11.5–15.5)
WBC: 13 10*3/uL — ABNORMAL HIGH (ref 4.0–10.5)
nRBC: 0 % (ref 0.0–0.2)

## 2020-11-02 LAB — BASIC METABOLIC PANEL
Anion gap: 9 (ref 5–15)
BUN: 12 mg/dL (ref 6–20)
CO2: 29 mmol/L (ref 22–32)
Calcium: 9.8 mg/dL (ref 8.9–10.3)
Chloride: 101 mmol/L (ref 98–111)
Creatinine, Ser: 0.94 mg/dL (ref 0.44–1.00)
GFR, Estimated: >60 mL/min (ref >60)
Glucose, Bld: 94 mg/dL (ref 70–99)
Potassium: 3.9 mmol/L (ref 3.5–5.1)
Sodium: 139 mmol/L (ref 135–145)

## 2020-11-02 LAB — TROPONIN I (HIGH SENSITIVITY): Troponin I (High Sensitivity): 3 ng/L (ref <18)

## 2020-11-02 LAB — I-STAT BETA HCG BLOOD, ED (MC, WL, AP ONLY): I-stat hCG, quantitative: <5 m[IU]/mL (ref <5)

## 2020-11-02 NOTE — ED Triage Notes (Signed)
Pt reports central chest pain for the past 2 weeks, no other symptoms.

## 2020-11-03 ENCOUNTER — Emergency Department (HOSPITAL_COMMUNITY)
Admission: EM | Admit: 2020-11-03 | Discharge: 2020-11-03 | Disposition: A | Discharge status: Home / Self Care | Payer: Medicaid Other | Attending: Emergency Medicine | Admitting: Emergency Medicine

## 2020-11-03 ENCOUNTER — Encounter (HOSPITAL_COMMUNITY): Payer: Self-pay | Admitting: Emergency Medicine

## 2020-11-03 ENCOUNTER — Other Ambulatory Visit: Payer: Self-pay

## 2020-11-03 DIAGNOSIS — Z5321 Procedure and treatment not carried out due to patient leaving prior to being seen by health care provider: Secondary | ICD-10-CM | POA: Insufficient documentation

## 2020-11-03 DIAGNOSIS — R0602 Shortness of breath: Secondary | ICD-10-CM | POA: Insufficient documentation

## 2020-11-03 DIAGNOSIS — R0789 Other chest pain: Secondary | ICD-10-CM | POA: Insufficient documentation

## 2020-11-03 LAB — BASIC METABOLIC PANEL
Anion gap: 10 (ref 5–15)
BUN: 11 mg/dL (ref 6–20)
CO2: 26 mmol/L (ref 22–32)
Calcium: 10.1 mg/dL (ref 8.9–10.3)
Chloride: 102 mmol/L (ref 98–111)
Creatinine, Ser: 0.8 mg/dL (ref 0.44–1.00)
GFR, Estimated: >60 mL/min (ref >60)
Glucose, Bld: 99 mg/dL (ref 70–99)
Potassium: 4 mmol/L (ref 3.5–5.1)
Sodium: 138 mmol/L (ref 135–145)

## 2020-11-03 LAB — CBC
HCT: 43.6 % (ref 36.0–46.0)
Hemoglobin: 14.2 g/dL (ref 12.0–15.0)
MCH: 32.9 pg (ref 26.0–34.0)
MCHC: 32.6 g/dL (ref 30.0–36.0)
MCV: 100.9 fL — ABNORMAL HIGH (ref 80.0–100.0)
Platelets: 339 10*3/uL (ref 150–400)
RBC: 4.32 MIL/uL (ref 3.87–5.11)
RDW: 12.2 % (ref 11.5–15.5)
WBC: 14.7 10*3/uL — ABNORMAL HIGH (ref 4.0–10.5)
nRBC: 0 % (ref 0.0–0.2)

## 2020-11-03 LAB — TROPONIN I (HIGH SENSITIVITY)
Troponin I (High Sensitivity): 2 ng/L (ref <18)
Troponin I (High Sensitivity): 3 ng/L (ref <18)

## 2020-11-03 NOTE — ED Notes (Signed)
Pt leaving AMA, advised to return if symptoms worsen. 

## 2020-11-03 NOTE — ED Triage Notes (Signed)
Pt reports pain to center of chest since yesterday morning with mild SOB.  Pt LWBS from ED yesterday.  Note from yesterday states chest pain x 2 weeks.

## 2020-11-08 ENCOUNTER — Ambulatory Visit (INDEPENDENT_AMBULATORY_CARE_PROVIDER_SITE_OTHER): Payer: Medicaid Other

## 2020-11-08 ENCOUNTER — Encounter: Payer: Self-pay | Admitting: Internal Medicine

## 2020-11-08 ENCOUNTER — Encounter: Payer: Self-pay | Admitting: *Deleted

## 2020-11-08 ENCOUNTER — Other Ambulatory Visit: Payer: Self-pay

## 2020-11-08 ENCOUNTER — Ambulatory Visit (INDEPENDENT_AMBULATORY_CARE_PROVIDER_SITE_OTHER): Payer: Medicaid Other | Admitting: Internal Medicine

## 2020-11-08 VITALS — BP 110/60 | HR 71 | Ht 63.0 in | Wt 159.2 lb

## 2020-11-08 DIAGNOSIS — R002 Palpitations: Secondary | ICD-10-CM | POA: Diagnosis not present

## 2020-11-08 DIAGNOSIS — R079 Chest pain, unspecified: Secondary | ICD-10-CM | POA: Insufficient documentation

## 2020-11-08 DIAGNOSIS — Z720 Tobacco use: Secondary | ICD-10-CM | POA: Diagnosis not present

## 2020-11-08 NOTE — Progress Notes (Signed)
Cardiology Office Note:    Date:  11/08/2020   ID:  PORSHIA BLIZZARD, DOB 1985-12-26, MRN [id number]  PCP:  Patient, No Pcp Per  [facility] Cardiologist:  No primary care provider on file.  [facility] Electrophysiologist:  None   CC: Chest pain Consulted for the evaluation of chest pain at the behest of Dr. Chaney Malling  History of Present Illness:    Canisha VISTA SAWATZKY is a 35 y.o. female with a hx of early menarche (age 33) and tobacco abuse who presented for evaluation 11/08/20.  Patient notes that she is feeling her racing but other wise ok.   Patient had ED evaluation for chest pain with right sided neck pain 10/24/20-> had issues since Thanksgiving with an issues with a pinched nerve.  Also noted squeezing in her chest that was new. Work up was relatively benign and asked to follow up with cardiology.  Patient now has chest squeezing.  Spontaneous.  Doesn't radiate.   Discomfort occurs with at any time day or night, last occurred last night, worsens with no triggers, and improves with walking around and time.  Patient exertion notable for running around with 4 kids with and feels no symptoms.  Notes shortness of breath with the squeezing; also noted DOE with exertion.Marland Kitchen  No PND or orthopnea.  No bendopnea,  leg swelling , or abdominal swelling.  No syncope or near syncope.  This makes her feel very tired  Notes that she had felt heart racing since the 26th.  Sometime her heart rate is 194.  With this had squeezing symptoms.  Has these symptoms daily for the past two weeks.  Patient reports NO prior cardiac testing including  echo,  stress test,  heart catheterizations,  cardioversion,  ablations.  No history of pre-eclampsia, notes early menarche, but no prematurity.  No Fen-Phen or drug use.  Past Medical History:  Diagnosis Date  . Headache   . Pregnancy as incidental finding     Past Surgical History:  Procedure Laterality Date  . CHOLECYSTECTOMY N/A 08/27/2020   Procedure:  LAPAROSCOPIC CHOLECYSTECTOMY;  Surgeon: [name], MD;  Location: [facility] OR;  Service: General;  Laterality: N/A;  . LAPAROSCOPIC TUBAL LIGATION Bilateral 07/23/2018   Procedure: LAPAROSCOPIC TUBAL LIGATION;  Surgeon: Hermina Staggers, MD;  Location: [facility] ORS;  Service: Gynecology;  Laterality: Bilateral;  . ORIF FINGER / THUMB FRACTURE    . WISDOM TOOTH EXTRACTION      Current Medications: Current Meds  Medication Sig  . acetaminophen (TYLENOL) 500 MG tablet Take 2 tablets (1,000 mg total) by mouth every 6 (six) hours as needed.     Allergies:   Patient has no known allergies.   Social History   Socioeconomic History  . Marital status: Married    Spouse name: Not on file  . Number of children: Not on file  . Years of education: Not on file  . Highest education level: Not on file  Occupational History  . Not on file  Tobacco Use  . Smoking status: Current Every Day Smoker    Packs/day: 0.50    Types: Cigarettes  . Smokeless tobacco: Never Used  Vaping Use  . Vaping Use: Former  Substance and Sexual Activity  . Alcohol use: No  . Drug use: No  . Sexual activity: Yes    Birth control/protection: None  Other Topics Concern  . Not on file  Social History Narrative  . Not on file   Social Determinants of Health  Financial Resource Strain: Not on file  Food Insecurity: Not on file  Transportation Needs: Not on file  Physical Activity: Not on file  Stress: Not on file  Social Connections: Not on file     Family History: The patient's family history includes Cancer in her maternal aunt, maternal grandmother, paternal grandfather, and paternal uncle; Dementia in her mother; HIV in her maternal aunt; Heart disease in her father; Hypertension in her father and paternal grandmother; Stroke in her father and maternal uncle. History of coronary artery disease notable for no members. History of heart failure notable for father. No history of cardiomyopathies including  hypertrophic cardiomyopathy, left ventricular non-compaction, or arrhythmogenic right ventricular cardiomyopathy. History of arrhythmia notable for sister (SVT and PVCs). Denies family history of sudden cardiac death including drowning, car accidents, or unexplained deaths in the family. No history of bicuspid aortic valve or aortic aneurysm or dissection.  ROS:   Please see the history of present illness.     All other systems reviewed and are negative.  EKGs/Labs/Other Studies Reviewed:    The following studies were reviewed today:  EKG:   11/05/20: Sinus Tachycardia Rate 101 WNL  Recent Labs: 08/27/2020: TSH 0.483 08/28/2020: ALT 24 11/03/2020: BUN 11; Creatinine, Ser 0.80; Hemoglobin 14.2; Platelets 339; Potassium 4.0; Sodium 138  Recent Lipid Panel No results found for: CHOL, TRIG, HDL, CHOLHDL, VLDL, LDLCALC, LDLDIRECT   Risk Assessment/Calculations:     N/A  Physical Exam:    VS:  BP 110/60   Pulse 71   Ht 5\' 3"  (1.6 m)   Wt 159 lb 3.2 oz (72.2 kg)   SpO2 97%   BMI 28.20 kg/m     Wt Readings from Last 3 Encounters:  11/08/20 159 lb 3.2 oz (72.2 kg)  08/27/20 170 lb (77.1 kg)  07/23/18 168 lb 2 oz (76.3 kg)     GEN:  Well nourished, well developed in no acute distress HEENT: Normal NECK: No JVD; No carotid bruits LYMPHATICS: No lymphadenopathy CARDIAC: RRR, no murmurs, rubs, gallops RESPIRATORY:  Clear to auscultation without rales, wheezing or rhonchi  ABDOMEN: Soft, non-tender, non-distended MUSCULOSKELETAL:  No edema; No deformity  SKIN: Warm and dry NEUROLOGIC:  Alert and oriented x 3 PSYCHIATRIC:  Normal affect   ASSESSMENT:    1. Chest pain of uncertain etiology   2. Palpitations   3. Tobacco abuse    PLAN:    In order of problems listed above:  Chest Pain Syndrome  Palpitations; possible SVT - Related to heart racing - will obtain 14-day heart monitor (ZioPatch)  Tobacco Abuse- will cut her after dinner cigarette - discussed the dangers  of tobacco use, both inhaled and oral, which include, but are not limited to cardiovascular disease, increased cancer risk of multiple types of cancer, COPD, peripheral arterial disease, strokes. - counseled on the benefits of smoking cessation. - firmly advised to quit.  - we also reviewed strategies to maximize success, including:  Removing cigarettes and smoking materials from environment   Stress management  Substitution of other forms of reinforcement (the one cigarette a day approach)  Support of family/friends and group smoking cessation  Selecting a quit date  Patient provided contact information for [employer] or 1-800-QUIT-NOW  Patient provided with [place]'s 8 free smoking cessation classes: ([phone] and [phone]   Three months follow up unless new symptoms or abnormal test results warranting change in plan  Would be reasonable for  APP Follow up   Medication Adjustments/Labs and Tests  Ordered: Current medicines are reviewed at length with the patient today.  Concerns regarding medicines are outlined above.  Orders Placed This Encounter  Procedures  . LONG TERM MONITOR (3-14 DAYS)   No orders of the defined types were placed in this encounter.   Patient Instructions  Medication Instructions:  Your physician recommends that you continue on your current medications as directed. Please refer to the Current Medication list given to you today.  *If you need a refill on your cardiac medications before your next appointment, please call your pharmacy*   Lab Work: NONE If you have labs (blood work) drawn today and your tests are completely normal, you will receive your results only by: Marland Kitchen MyChart Message (if you have MyChart) OR . A paper copy in the mail If you have any lab test that is abnormal or we need to change your treatment, we will call you to review the results.   Testing/Procedures: Your physician has requested that you  wear a 14 day heart monitor.    Follow-Up: At [facility], you and your health needs are our priority.  As part of our continuing mission to provide you with exceptional heart care, we have created designated Provider Care Teams.  These Care Teams include your primary Cardiologist (physician) and Advanced Practice Providers (APPs -  Physician Assistants and Nurse Practitioners) who all work together to provide you with the care you need, when you need it.  We recommend signing up for the patient portal called "MyChart".  Sign up information is provided on this After Visit Summary.  MyChart is used to connect with patients for Virtual Visits (Telemedicine).  Patients are able to view lab/test results, encounter notes, upcoming appointments, etc.  Non-urgent messages can be sent to your provider as well.   To learn more about what you can do with MyChart, go to [email or web address].    Your next appointment:   3 month(s)  The format for your next appointment:   In Person  Provider:   You may see Izora Ribas, MD or one of the following Advanced Practice Providers on your designated Care Team:    Ronie Spies, PA-C  Jacolyn Reedy, PA-C    Other Instructions  Christena Deem- Long Term Monitor Instructions   Your physician has requested you wear your ZIO patch monitor___14____days.   This is a single patch monitor.  [employer] supplies one patch monitor per enrollment.  Additional stickers are not available.   Please do not apply patch if you will be having a Nuclear Stress Test, Echocardiogram, Cardiac CT, MRI, or Chest Xray during the time frame you would be wearing the monitor. The patch cannot be worn during these tests.  You cannot remove and re-apply the ZIO XT patch monitor.   Your ZIO patch monitor will be sent USPS Priority mail from [facility] directly to your home address. The monitor may also be mailed to a PO BOX if home delivery is not available.   It may take 3-5 days  to receive your monitor after you have been enrolled.   Once you have received you monitor, please review enclosed instructions.  Your monitor has already been registered assigning a specific monitor serial # to you.   Applying the monitor   Shave hair from upper left chest.   Hold abrader disc by orange tab.  Rub abrader in 40 strokes over left upper chest as indicated in your monitor instructions.   Clean area with 4 enclosed alcohol pads .  Use all pads to assure are is cleaned thoroughly.  Let dry.   Apply patch as indicated in monitor instructions.  Patch will be place under collarbone on left side of chest with arrow pointing upward.   Rub patch adhesive wings for 2 minutes.Remove white label marked "1".  Remove white label marked "2".  Rub patch adhesive wings for 2 additional minutes.   While looking in a mirror, press and release button in center of patch.  A small green light will flash 3-4 times .  This will be your only indicator the monitor has been turned on.     Do not shower for the first 24 hours.  You may shower after the first 24 hours.   Press button if you feel a symptom. You will hear a small click.  Record Date, Time and Symptom in the Patient Log Book.   When you are ready to remove patch, follow instructions on last 2 pages of Patient Log Book.  Stick patch monitor onto last page of Patient Log Book.   Place Patient Log Book in [place] box.  Use locking tab on box and tape box closed securely.  The Orange and [employer] has [employer] on it.  Please place in mailbox as soon as possible.  Your physician should have your test results approximately 7 days after the monitor has been mailed back to [facility].   Call [facility] Customer Care at [phone] if you have questions regarding your ZIO XT patch monitor.  Call them immediately if you see an orange light blinking on your monitor.   If your monitor falls off in less than 4 days contact our Monitor  department at [phone].  If your monitor becomes loose or falls off after 4 days call [employer] at [phone] for suggestions on securing your monitor.       Signed, Christell Constant, MD  11/08/2020 3:41 PM    [facility]

## 2020-11-08 NOTE — Patient Instructions (Signed)
Medication Instructions:  Your physician recommends that you continue on your current medications as directed. Please refer to the Current Medication list given to you today.  *If you need a refill on your cardiac medications before your next appointment, please call your pharmacy*   Lab Work: NONE If you have labs (blood work) drawn today and your tests are completely normal, you will receive your results only by: Marland Kitchen MyChart Message (if you have MyChart) OR . A paper copy in the mail If you have any lab test that is abnormal or we need to change your treatment, we will call you to review the results.   Testing/Procedures: Your physician has requested that you wear a 14 day heart monitor.    Follow-Up: At [facility], you and your health needs are our priority.  As part of our continuing mission to provide you with exceptional heart care, we have created designated Provider Care Teams.  These Care Teams include your primary Cardiologist (physician) and Advanced Practice Providers (APPs -  Physician Assistants and Nurse Practitioners) who all work together to provide you with the care you need, when you need it.  We recommend signing up for the patient portal called "MyChart".  Sign up information is provided on this After Visit Summary.  MyChart is used to connect with patients for Virtual Visits (Telemedicine).  Patients are able to view lab/test results, encounter notes, upcoming appointments, etc.  Non-urgent messages can be sent to your provider as well.   To learn more about what you can do with MyChart, go to [email or web address].    Your next appointment:   3 month(s)  The format for your next appointment:   In Person  Provider:   You may see Izora Ribas, MD or one of the following Advanced Practice Providers on your designated Care Team:    Ronie Spies, PA-C  Jacolyn Reedy, PA-C    Other Instructions  Christena Deem- Long Term Monitor Instructions   Your physician has  requested you wear your ZIO patch monitor___14____days.   This is a single patch monitor.  Irhythm supplies one patch monitor per enrollment.  Additional stickers are not available.   Please do not apply patch if you will be having a Nuclear Stress Test, Echocardiogram, Cardiac CT, MRI, or Chest Xray during the time frame you would be wearing the monitor. The patch cannot be worn during these tests.  You cannot remove and re-apply the ZIO XT patch monitor.   Your ZIO patch monitor will be sent USPS Priority mail from [facility] directly to your home address. The monitor may also be mailed to a PO BOX if home delivery is not available.   It may take 3-5 days to receive your monitor after you have been enrolled.   Once you have received you monitor, please review enclosed instructions.  Your monitor has already been registered assigning a specific monitor serial # to you.   Applying the monitor   Shave hair from upper left chest.   Hold abrader disc by orange tab.  Rub abrader in 40 strokes over left upper chest as indicated in your monitor instructions.   Clean area with 4 enclosed alcohol pads .  Use all pads to assure are is cleaned thoroughly.  Let dry.   Apply patch as indicated in monitor instructions.  Patch will be place under collarbone on left side of chest with arrow pointing upward.   Rub patch adhesive wings for 2 minutes.Remove white label marked "1".  Remove white label marked "2".  Rub patch adhesive wings for 2 additional minutes.   While looking in a mirror, press and release button in center of patch.  A small green light will flash 3-4 times .  This will be your only indicator the monitor has been turned on.     Do not shower for the first 24 hours.  You may shower after the first 24 hours.   Press button if you feel a symptom. You will hear a small click.  Record Date, Time and Symptom in the Patient Log Book.   When you are ready to remove patch, follow  instructions on last 2 pages of Patient Log Book.  Stick patch monitor onto last page of Patient Log Book.   Place Patient Log Book in [place] box.  Use locking tab on box and tape box closed securely.  The Orange and [employer] has [employer] on it.  Please place in mailbox as soon as possible.  Your physician should have your test results approximately 7 days after the monitor has been mailed back to [facility].   Call [place] at [phone] if you have questions regarding your ZIO XT patch monitor.  Call them immediately if you see an orange light blinking on your monitor.   If your monitor falls off in less than 4 days contact our Monitor department at [phone].  If your monitor becomes loose or falls off after 4 days call [employer] at [phone] for suggestions on securing your monitor.

## 2020-11-08 NOTE — Progress Notes (Signed)
Patient ID: Brooke Carrillo, female   DOB: [birth year], 35 y.o.   MRN: [id number] Patient enrolled for [employer] to ship a 14 day ZIO XT long term holter monitor to her home.

## 2020-11-13 DIAGNOSIS — R002 Palpitations: Secondary | ICD-10-CM | POA: Diagnosis not present

## 2020-11-13 DIAGNOSIS — R079 Chest pain, unspecified: Secondary | ICD-10-CM

## 2020-11-29 ENCOUNTER — Encounter (INDEPENDENT_AMBULATORY_CARE_PROVIDER_SITE_OTHER): Payer: Self-pay | Admitting: Primary Care

## 2020-11-29 ENCOUNTER — Ambulatory Visit (INDEPENDENT_AMBULATORY_CARE_PROVIDER_SITE_OTHER): Payer: Medicaid Other | Admitting: Primary Care

## 2020-11-29 ENCOUNTER — Other Ambulatory Visit: Payer: Self-pay

## 2020-11-29 VITALS — BP 116/74 | HR 65 | Temp 98.1°F | Ht 63.0 in | Wt 160.2 lb

## 2020-11-29 DIAGNOSIS — Z7689 Persons encountering health services in other specified circumstances: Secondary | ICD-10-CM

## 2020-11-29 DIAGNOSIS — R519 Headache, unspecified: Secondary | ICD-10-CM

## 2020-11-29 DIAGNOSIS — Z720 Tobacco use: Secondary | ICD-10-CM

## 2020-11-29 DIAGNOSIS — G8929 Other chronic pain: Secondary | ICD-10-CM

## 2020-11-29 DIAGNOSIS — R079 Chest pain, unspecified: Secondary | ICD-10-CM

## 2020-11-29 NOTE — Progress Notes (Signed)
New Patient Office Visit  Subjective:  Patient ID: Brooke Carrillo, female    DOB: 1986-01-25  Age: 35 y.o. MRN: [id number]  CC:  Chief Complaint  Patient presents with  . New Patient (Initial Visit)    HPI Brooke Carrillo is a 35 year old female presents for establishment of care and she does have headaches and dizziness all the time. Uses ibuprofen for the pain but little to no help.Location is parietal lobe. Admits to drinking 4-5 12 oz bottles of Pepsi a day and smokes cigarettes approximately 1/2 ppd. No aura . Blurry vision and had a opthalmology exam last month wears glasses no changes in strength.   Past Medical History:  Diagnosis Date  . Headache   . Pregnancy as incidental finding     Past Surgical History:  Procedure Laterality Date  . CHOLECYSTECTOMY N/A 08/27/2020   Procedure: LAPAROSCOPIC CHOLECYSTECTOMY;  Surgeon: [name], MD;  Location: [facility] OR;  Service: General;  Laterality: N/A;  . LAPAROSCOPIC TUBAL LIGATION Bilateral 07/23/2018   Procedure: LAPAROSCOPIC TUBAL LIGATION;  Surgeon: Hermina Staggers, MD;  Location: [facility] ORS;  Service: Gynecology;  Laterality: Bilateral;  . ORIF FINGER / THUMB FRACTURE    . WISDOM TOOTH EXTRACTION      Family History  Problem Relation Age of Onset  . Hypertension Father   . Stroke Father   . Heart disease Father   . Dementia Mother   . Cancer Maternal Aunt        Breast  . HIV Maternal Aunt   . Stroke Maternal Uncle   . Cancer Paternal Uncle        Colon  . Cancer Maternal Grandmother   . Hypertension Paternal Grandmother   . Cancer Paternal Grandfather        Colon    Social History   Socioeconomic History  . Marital status: Married    Spouse name: Not on file  . Number of children: Not on file  . Years of education: Not on file  . Highest education level: Not on file  Occupational History  . Not on file  Tobacco Use  . Smoking status: Current Every Day Smoker    Packs/day: 0.50    Types:  Cigarettes  . Smokeless tobacco: Never Used  Vaping Use  . Vaping Use: Former  Substance and Sexual Activity  . Alcohol use: No  . Drug use: No  . Sexual activity: Yes    Birth control/protection: None  Other Topics Concern  . Not on file  Social History Narrative  . Not on file   Social Determinants of Health   Financial Resource Strain: Not on file  Food Insecurity: Not on file  Transportation Needs: Not on file  Physical Activity: Not on file  Stress: Not on file  Social Connections: Not on file  Intimate Partner Violence: Not on file    ROS Review of Systems Pertinent positive and negatives noted in HPI. Objective:   Today's Vitals: BP 116/74 (BP Location: Right Arm, Patient Position: Sitting, Cuff Size: Normal)   Pulse 65   Temp 98.1 F (36.7 C) (Temporal)   Ht 5\' 3"  (1.6 m)   Wt 160 lb 3.2 oz (72.7 kg)   SpO2 98%   BMI 28.38 kg/m   Physical Exam Vitals reviewed.  Constitutional:      Appearance: Normal appearance.  HENT:     Head: Normocephalic.     Right Ear: Tympanic membrane and external ear normal.  Left Ear: Tympanic membrane and external ear normal.     Nose: Nose normal.  Eyes:     Extraocular Movements: Extraocular movements intact.     Pupils: Pupils are equal, round, and reactive to light.  Cardiovascular:     Rate and Rhythm: Normal rate and regular rhythm.  Pulmonary:     Effort: Pulmonary effort is normal.     Breath sounds: Normal breath sounds.  Abdominal:     General: Bowel sounds are normal. There is distension.     Palpations: Abdomen is soft.  Musculoskeletal:        General: Normal range of motion.     Cervical back: Normal range of motion.  Skin:    General: Skin is warm and dry.  Neurological:     Mental Status: She is alert and oriented to person, place, and time.  Psychiatric:        Mood and Affect: Mood normal.        Behavior: Behavior normal.        Thought Content: Thought content normal.        Judgment:  Judgment normal.     Assessment & Plan:  Milderd was seen today for new patient (initial visit).  Diagnoses and all orders for this visit:  Encounter to establish care Establish care with new provider   Chronic nonintractable headache, unspecified headache type Dicussed excessive amount of caffeine and  Smoking . Unusal finding the only time her head ache ceased when she on her menstrual cycle.   Tobacco abuse Down to 1/2 ppd . She is aware of increased risk for lung cancer and other respiratory diseases recommend cessation.  This will be reminded at each clinical visit.  Chest pain of uncertain etiology  Recently establish care with cardiology Christell Constant, MD     Outpatient Encounter Medications as of 11/29/2020  Medication Sig  . [DISCONTINUED] acetaminophen (TYLENOL) 500 MG tablet Take 2 tablets (1,000 mg total) by mouth every 6 (six) hours as needed.   No facility-administered encounter medications on file as of 11/29/2020.    Follow-up: Return for schedule for pap at your convience .   Grayce Sessions, NP

## 2020-11-29 NOTE — Patient Instructions (Signed)
General Headache Without Cause A headache is pain or discomfort that is felt around the head or neck area. There are many causes and types of headaches. In some cases, the cause may not be found. Follow these instructions at home: Watch your condition for any changes. Let your doctor know about them. Take these steps to help with your condition: Managing pain  Take over-the-counter and prescription medicines only as told by your doctor.  Lie down in a dark, quiet room when you have a headache.  If told, put ice on your head and neck area: ? Put ice in a plastic bag. ? Place a towel between your skin and the bag. ? Leave the ice on for 20 minutes, 2-3 times per day.  If told, put heat on the affected area. Use the heat source that your doctor recommends, such as a moist heat pack or a heating pad. ? Place a towel between your skin and the heat source. ? Leave the heat on for 20-30 minutes. ? Remove the heat if your skin turns bright red. This is very important if you are unable to feel pain, heat, or cold. You may have a greater risk of getting burned.  Keep lights dim if bright lights bother you or make your headaches worse.      Eating and drinking  Eat meals on a regular schedule.  If you drink alcohol: ? Limit how much you use to:  0-1 drink a day for women.  0-2 drinks a day for men. ? Be aware of how much alcohol is in your drink. In the [place] one drink equals one 12 oz bottle of beer (355 mL), one 5 oz glass of wine (148 mL), or one 1 oz glass of hard liquor (44 mL).  Stop drinking caffeine, or reduce how much caffeine you drink. General instructions  Keep a journal to find out if certain things bring on headaches. For example, write down: ? What you eat and drink. ? How much sleep you get. ? Any change to your diet or medicines.  Get a massage or try other ways to relax.  Limit stress.  Sit up straight. Do not tighten (tense) your muscles.  Do not use any  products that contain nicotine or tobacco. This includes cigarettes, e-cigarettes, and chewing tobacco. If you need help quitting, ask your doctor.  Exercise regularly as told by your doctor.  Get enough sleep. This often means 7-9 hours of sleep each night.  Keep all follow-up visits as told by your doctor. This is important.   Contact a doctor if:  Your symptoms are not helped by medicine.  You have a headache that feels different than the other headaches.  You feel sick to your stomach (nauseous) or you throw up (vomit).  You have a fever. Get help right away if:  Your headache gets very bad quickly.  Your headache gets worse after a lot of physical activity.  You keep throwing up.  You have a stiff neck.  You have trouble seeing.  You have trouble speaking.  You have pain in the eye or ear.  Your muscles are weak or you lose muscle control.  You lose your balance or have trouble walking.  You feel like you will pass out (faint) or you pass out.  You are mixed up (confused).  You have a seizure. Summary  A headache is pain or discomfort that is felt around the head or neck area.  There are many   causes and types of headaches. In some cases, the cause may not be found.  Keep a journal to help find out what causes your headaches. Watch your condition for any changes. Let your doctor know about them.  Contact a doctor if you have a headache that is different from usual, or if your headache is not helped by medicine.  Get help right away if your headache gets very bad, you throw up, you have trouble seeing, you lose your balance, or you have a seizure. This information is not intended to replace advice given to you by your health care provider. Make sure you discuss any questions you have with your health care provider. Document Revised: 04/05/2018 Document Reviewed: 04/05/2018 Elsevier Patient Education  2021 Elsevier Inc.  

## 2020-11-30 ENCOUNTER — Encounter: Payer: Self-pay | Admitting: Neurology

## 2020-12-08 ENCOUNTER — Emergency Department (HOSPITAL_COMMUNITY): Payer: Medicaid Other

## 2020-12-08 ENCOUNTER — Other Ambulatory Visit: Payer: Self-pay

## 2020-12-08 ENCOUNTER — Encounter (HOSPITAL_COMMUNITY): Payer: Self-pay | Admitting: Emergency Medicine

## 2020-12-08 ENCOUNTER — Emergency Department (HOSPITAL_COMMUNITY)
Admission: EM | Admit: 2020-12-08 | Discharge: 2020-12-08 | Disposition: A | Discharge status: Home / Self Care | Payer: Medicaid Other | Attending: Emergency Medicine | Admitting: Emergency Medicine

## 2020-12-08 DIAGNOSIS — M25512 Pain in left shoulder: Secondary | ICD-10-CM | POA: Diagnosis not present

## 2020-12-08 DIAGNOSIS — R079 Chest pain, unspecified: Secondary | ICD-10-CM | POA: Diagnosis not present

## 2020-12-08 DIAGNOSIS — R202 Paresthesia of skin: Secondary | ICD-10-CM | POA: Diagnosis present

## 2020-12-08 DIAGNOSIS — R110 Nausea: Secondary | ICD-10-CM | POA: Insufficient documentation

## 2020-12-08 DIAGNOSIS — F17210 Nicotine dependence, cigarettes, uncomplicated: Secondary | ICD-10-CM | POA: Diagnosis not present

## 2020-12-08 DIAGNOSIS — M542 Cervicalgia: Secondary | ICD-10-CM | POA: Insufficient documentation

## 2020-12-08 DIAGNOSIS — H53149 Visual discomfort, unspecified: Secondary | ICD-10-CM | POA: Diagnosis not present

## 2020-12-08 DIAGNOSIS — R519 Headache, unspecified: Secondary | ICD-10-CM | POA: Insufficient documentation

## 2020-12-08 LAB — COMPREHENSIVE METABOLIC PANEL
ALT: 9 U/L (ref 0–44)
AST: 14 U/L — ABNORMAL LOW (ref 15–41)
Albumin: 3.6 g/dL (ref 3.5–5.0)
Alkaline Phosphatase: 81 U/L (ref 38–126)
Anion gap: 5 (ref 5–15)
BUN: 7 mg/dL (ref 6–20)
CO2: 29 mmol/L (ref 22–32)
Calcium: 9.3 mg/dL (ref 8.9–10.3)
Chloride: 100 mmol/L (ref 98–111)
Creatinine, Ser: 0.83 mg/dL (ref 0.44–1.00)
GFR, Estimated: >60 mL/min (ref >60)
Glucose, Bld: 99 mg/dL (ref 70–99)
Potassium: 4.2 mmol/L (ref 3.5–5.1)
Sodium: 134 mmol/L — ABNORMAL LOW (ref 135–145)
Total Bilirubin: 0.3 mg/dL (ref 0.3–1.2)
Total Protein: 6.4 g/dL — ABNORMAL LOW (ref 6.5–8.1)

## 2020-12-08 LAB — CBC WITH DIFFERENTIAL/PLATELET
Abs Immature Granulocytes: 0.03 10*3/uL (ref 0.00–0.07)
Basophils Absolute: 0.1 10*3/uL (ref 0.0–0.1)
Basophils Relative: 1 %
Eosinophils Absolute: 0.4 10*3/uL (ref 0.0–0.5)
Eosinophils Relative: 4 %
HCT: 40.3 % (ref 36.0–46.0)
Hemoglobin: 13.3 g/dL (ref 12.0–15.0)
Immature Granulocytes: 0 %
Lymphocytes Relative: 44 %
Lymphs Abs: 5 10*3/uL — ABNORMAL HIGH (ref 0.7–4.0)
MCH: 32 pg (ref 26.0–34.0)
MCHC: 33 g/dL (ref 30.0–36.0)
MCV: 96.9 fL (ref 80.0–100.0)
Monocytes Absolute: 0.6 10*3/uL (ref 0.1–1.0)
Monocytes Relative: 5 %
Neutro Abs: 5.2 10*3/uL (ref 1.7–7.7)
Neutrophils Relative %: 46 %
Platelets: 269 10*3/uL (ref 150–400)
RBC: 4.16 MIL/uL (ref 3.87–5.11)
RDW: 11.9 % (ref 11.5–15.5)
WBC: 11.3 10*3/uL — ABNORMAL HIGH (ref 4.0–10.5)
nRBC: 0 % (ref 0.0–0.2)

## 2020-12-08 LAB — I-STAT BETA HCG BLOOD, ED (MC, WL, AP ONLY): I-stat hCG, quantitative: <5 m[IU]/mL (ref <5)

## 2020-12-08 LAB — MAGNESIUM: Magnesium: 1.9 mg/dL (ref 1.7–2.4)

## 2020-12-08 LAB — TROPONIN I (HIGH SENSITIVITY): Troponin I (High Sensitivity): <2 ng/L (ref <18)

## 2020-12-08 NOTE — ED Notes (Signed)
Pt has returned from MRI. 

## 2020-12-08 NOTE — ED Triage Notes (Signed)
C/o numbness across forehead and nose, L sided neck, and L shoulder pain x 2 weeks.  No known injury.

## 2020-12-08 NOTE — ED Notes (Signed)
Patient transported to MRI 

## 2020-12-08 NOTE — ED Provider Notes (Signed)
[facility] EMERGENCY DEPARTMENT Provider Note   CSN: [id number] Arrival date & time: 12/08/20  1713     History Chief Complaint  Patient presents with  . forehead numbness  . Neck Pain    Brooke Carrillo is a 35 y.o. female.  The history is provided by the patient and medical records.  Neck Pain  Brooke Carrillo is a 35 y.o. female who presents to the Emergency Department complaining of numbness. She presents the emergency department complaining of two weeks of numbness in her forehead that crosses the entire forehead down to the top of her nose. The numbness is constant but she has episodes of associated pressure in her forehead that last seconds to 10 minutes at a time. She does not state that this is a headache. Over the last four days that she gets associated blurry vision and spots when she has a pressure sensation, which then resolved. She also reports patchy numbness in all four extremities since the day after Thanksgiving. She also complains of chest pain, squeezing in the center of her chest that is been off and on for a month. She has been evaluated by cardiology for the symptoms.   She denies shortness of breath, fever, abdominal pain, diarrhea. She does have nausea. She also complains of left sided shoulder and neck pain described as stabbing in nature, this started two days ago and comes and goes.   No known medical problems.  Takes no medications.   Smokes tobacco.  No alcohol, no street drugs.   Father with CHF, renal disease.  Family hx/o breast, colon ca  She has been to the emergency department twice previously for evaluation of the symptoms of but left prior to evaluation due to long wait times.    Past Medical History:  Diagnosis Date  . Headache   . Pregnancy as incidental finding     Patient Active Problem List   Diagnosis Date Noted  . Chest pain of uncertain etiology 11/08/2020  . Palpitations 11/08/2020  . Acute cholecystitis 08/27/2020   . Sinus bradycardia 08/27/2020  . Postpartum care and examination 06/01/2018  . Unwanted fertility 06/01/2018  . Contraception management 06/01/2018  . Vitamin D deficiency 10/21/2017  . Tobacco abuse 10/13/2017    Past Surgical History:  Procedure Laterality Date  . CHOLECYSTECTOMY N/A 08/27/2020   Procedure: LAPAROSCOPIC CHOLECYSTECTOMY;  Surgeon: [name], MD;  Location: [facility] OR;  Service: General;  Laterality: N/A;  . LAPAROSCOPIC TUBAL LIGATION Bilateral 07/23/2018   Procedure: LAPAROSCOPIC TUBAL LIGATION;  Surgeon: Hermina Staggers, MD;  Location: [facility] ORS;  Service: Gynecology;  Laterality: Bilateral;  . ORIF FINGER / THUMB FRACTURE    . WISDOM TOOTH EXTRACTION       OB History    Gravida  8   Para  4   Term  4   Preterm  0   AB  4   Living  4     SAB  3   IAB  1   Ectopic  0   Multiple  0   Live Births  4           Family History  Problem Relation Age of Onset  . Hypertension Father   . Stroke Father   . Heart disease Father   . Dementia Mother   . Cancer Maternal Aunt        Breast  . HIV Maternal Aunt   . Stroke Maternal Uncle   . Cancer Paternal  Uncle        Colon  . Cancer Maternal Grandmother   . Hypertension Paternal Grandmother   . Cancer Paternal Grandfather        Colon    Social History   Tobacco Use  . Smoking status: Current Every Day Smoker    Packs/day: 0.50    Types: Cigarettes  . Smokeless tobacco: Never Used  Vaping Use  . Vaping Use: Former  Substance Use Topics  . Alcohol use: No  . Drug use: No    Home Medications Prior to Admission medications   Not on File    Allergies    Patient has no known allergies.  Review of Systems   Review of Systems  Musculoskeletal: Positive for neck pain.  All other systems reviewed and are negative.   Physical Exam Updated Vital Signs BP 107/67 (BP Location: Left Arm)   Pulse 79   Temp 98.2 F (36.8 C)   Resp 16   LMP 11/26/2020   SpO2 100%    Physical Exam Vitals and nursing note reviewed.  Constitutional:      Appearance: She is well-developed.  HENT:     Head: Normocephalic and atraumatic.  Cardiovascular:     Rate and Rhythm: Normal rate and regular rhythm.     Heart sounds: No murmur heard.   Pulmonary:     Effort: Pulmonary effort is normal. No respiratory distress.     Breath sounds: Normal breath sounds.  Abdominal:     Palpations: Abdomen is soft.     Tenderness: There is no abdominal tenderness. There is no guarding or rebound.  Musculoskeletal:        General: No swelling or tenderness.     Comments: 2+ DP pulses bilaterally  Skin:    General: Skin is warm and dry.  Neurological:     Mental Status: She is alert and oriented to person, place, and time.     Comments: No asymmetry of facial movements.  Visual fields grossly intact.  5/5 strength in all four extremities with sensation to light touch intact in all four extremities.    Psychiatric:        Behavior: Behavior normal.     ED Results / Procedures / Treatments   Labs (all labs ordered are listed, but only abnormal results are displayed) Labs Reviewed - No data to display  EKG None  Radiology No results found.  Procedures Procedures   Medications Ordered in ED Medications - No data to display  ED Course  I have reviewed the triage vital signs and the nursing notes.  Pertinent labs & imaging results that were available during my care of the patient were reviewed by me and considered in my medical decision making (see chart for details).    MDM Rules/Calculators/A&P                         patient here for evaluation of two weeks of forehead paresthesias with intermittent headache as well as several months of paresthesias to her extremities. She is non-toxic appearing on evaluation with no focal neurologic deficits. CBC with leukocytosis, similar when compared to priors. 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 Presentation is not consistent with CVA, MS, subarachnoid hemorrhage, meningitis. Discussed with patient unclear source of symptoms. She has planned neurology follow-up.  Final Clinical Impression(s) / ED Diagnoses Final diagnoses:  Paresthesia  Bad headache    Rx / DC Orders  ED Discharge Orders    None       Tilden Fossa, MD 12/08/20 2317

## 2020-12-10 ENCOUNTER — Telehealth: Payer: Self-pay | Admitting: *Deleted

## 2020-12-10 NOTE — Telephone Encounter (Signed)
Transition Care Management Follow-up Telephone Call  Date of discharge and from where: 12/09/2020 - [facility]  How have you been since you were released from the hospital? "Doing better"  Any questions or concerns? No  Items Reviewed:  Did the pt receive and understand the discharge instructions provided? Yes   Medications obtained and verified? Yes   Other? No   Any new allergies since your discharge? No   Dietary orders reviewed? Yes  Do you have support at home? Yes   Home Care and Equipment/Supplies: Were home health services ordered? not applicable If so, what is the name of the agency? N/A  Has the agency set up a time to come to the patient's home? not applicable Were any new equipment or medical supplies ordered?  No What is the name of the medical supply agency? N/A Were you able to get the supplies/equipment? not applicable Do you have any questions related to the use of the equipment or supplies? No  Functional Questionnaire: (I = Independent and D = Dependent) ADLs: I  Bathing/Dressing- I  Meal Prep- I  Eating- I  Maintaining continence- I  Transferring/Ambulation- I  Managing Meds- I  Follow up appointments reviewed:   PCP Hospital f/u appt confirmed? Yes  Scheduled to see PCP on 12/31/2020 @ 1010.  Specialist Hospital f/u appt confirmed? Yes  Scheduled to see Neurology on 02/06/2021 @ 0910.  Are transportation arrangements needed? No   If their condition worsens, is the pt aware to call PCP or go to the Emergency Dept.? Yes  Was the patient provided with contact information for the PCP's office or ED? Yes  Was to pt encouraged to call back with questions or concerns? Yes

## 2020-12-11 ENCOUNTER — Emergency Department (HOSPITAL_COMMUNITY)
Admission: EM | Admit: 2020-12-11 | Discharge: 2020-12-12 | Disposition: A | Discharge status: Home / Self Care | Payer: Medicaid Other | Attending: Emergency Medicine | Admitting: Emergency Medicine

## 2020-12-11 ENCOUNTER — Encounter (HOSPITAL_COMMUNITY): Payer: Self-pay

## 2020-12-11 DIAGNOSIS — R1012 Left upper quadrant pain: Secondary | ICD-10-CM | POA: Insufficient documentation

## 2020-12-11 DIAGNOSIS — R059 Cough, unspecified: Secondary | ICD-10-CM | POA: Insufficient documentation

## 2020-12-11 DIAGNOSIS — R109 Unspecified abdominal pain: Secondary | ICD-10-CM

## 2020-12-11 DIAGNOSIS — R0789 Other chest pain: Secondary | ICD-10-CM | POA: Diagnosis not present

## 2020-12-11 DIAGNOSIS — R079 Chest pain, unspecified: Secondary | ICD-10-CM

## 2020-12-11 DIAGNOSIS — R1011 Right upper quadrant pain: Secondary | ICD-10-CM | POA: Insufficient documentation

## 2020-12-11 DIAGNOSIS — F17210 Nicotine dependence, cigarettes, uncomplicated: Secondary | ICD-10-CM | POA: Insufficient documentation

## 2020-12-11 MED ORDER — ONDANSETRON 4 MG PO TBDP
4.0000 mg | ORAL_TABLET | Freq: Once | ORAL | Status: AC | PRN
  Administered 2020-12-11: 4 mg via ORAL
  Filled 2020-12-11: qty 1

## 2020-12-11 NOTE — ED Triage Notes (Signed)
Pt states intermittent  chest pain radiating to left side of neck  For a  While and abd pain and nausea starting today . Pt denies any SOB

## 2020-12-12 ENCOUNTER — Emergency Department (HOSPITAL_COMMUNITY): Payer: Medicaid Other

## 2020-12-12 LAB — BASIC METABOLIC PANEL
Anion gap: 7 (ref 5–15)
BUN: 8 mg/dL (ref 6–20)
CO2: 26 mmol/L (ref 22–32)
Calcium: 9.6 mg/dL (ref 8.9–10.3)
Chloride: 106 mmol/L (ref 98–111)
Creatinine, Ser: 0.81 mg/dL (ref 0.44–1.00)
GFR, Estimated: >60 mL/min (ref >60)
Glucose, Bld: 108 mg/dL — ABNORMAL HIGH (ref 70–99)
Potassium: 4.8 mmol/L (ref 3.5–5.1)
Sodium: 139 mmol/L (ref 135–145)

## 2020-12-12 LAB — TROPONIN I (HIGH SENSITIVITY)
Troponin I (High Sensitivity): 3 ng/L (ref <18)
Troponin I (High Sensitivity): 3 ng/L (ref <18)

## 2020-12-12 LAB — I-STAT BETA HCG BLOOD, ED (MC, WL, AP ONLY): I-stat hCG, quantitative: <5 m[IU]/mL (ref <5)

## 2020-12-12 LAB — CBC
HCT: 41 % (ref 36.0–46.0)
Hemoglobin: 13.8 g/dL (ref 12.0–15.0)
MCH: 32.5 pg (ref 26.0–34.0)
MCHC: 33.7 g/dL (ref 30.0–36.0)
MCV: 96.7 fL (ref 80.0–100.0)
Platelets: 284 10*3/uL (ref 150–400)
RBC: 4.24 MIL/uL (ref 3.87–5.11)
RDW: 11.9 % (ref 11.5–15.5)
WBC: 13.2 10*3/uL — ABNORMAL HIGH (ref 4.0–10.5)
nRBC: 0 % (ref 0.0–0.2)

## 2020-12-12 LAB — LIPASE, BLOOD: Lipase: 28 U/L (ref 11–51)

## 2020-12-12 NOTE — ED Provider Notes (Signed)
[facility] EMERGENCY DEPARTMENT Provider Note   CSN: [id number] Arrival date & time: 12/11/20  2341     History Chief Complaint  Patient presents with  . Chest Pain  . Abdominal Pain    Brooke Carrillo is a 35 y.o. female.  Patient to ED for evaluation of left upper chest pain that radiates to the left lateral neck. Symptoms are longstanding without change today. No SOB, fever, congestion. She reports that new today is bilateral upper abdominal discomfort. She feels this more with movement, palpation or cough. She reports a chronic "smokers" cough that is unchanged today. No nausea, vomiting, diarrhea. She is s/p cholecystectomy.  The history is provided by the patient. No language interpreter was used.  Chest Pain Associated symptoms: abdominal pain and cough ("smokers" cough, unchanged today)   Associated symptoms: no fever, no nausea, no numbness, no shortness of breath and no weakness   Abdominal Pain Associated symptoms: chest pain and cough ("smokers" cough, unchanged today)   Associated symptoms: no chills, no fever, no nausea and no shortness of breath        Past Medical History:  Diagnosis Date  . Headache   . Pregnancy as incidental finding     Patient Active Problem List   Diagnosis Date Noted  . Chest pain of uncertain etiology 11/08/2020  . Palpitations 11/08/2020  . Acute cholecystitis 08/27/2020  . Sinus bradycardia 08/27/2020  . Postpartum care and examination 06/01/2018  . Unwanted fertility 06/01/2018  . Contraception management 06/01/2018  . Vitamin D deficiency 10/21/2017  . Tobacco abuse 10/13/2017    Past Surgical History:  Procedure Laterality Date  . CHOLECYSTECTOMY N/A 08/27/2020   Procedure: LAPAROSCOPIC CHOLECYSTECTOMY;  Surgeon: [name], MD;  Location: [facility] OR;  Service: General;  Laterality: N/A;  . LAPAROSCOPIC TUBAL LIGATION Bilateral 07/23/2018   Procedure: LAPAROSCOPIC TUBAL LIGATION;  Surgeon: Hermina Staggers, MD;  Location: [facility] ORS;  Service: Gynecology;  Laterality: Bilateral;  . ORIF FINGER / THUMB FRACTURE    . WISDOM TOOTH EXTRACTION       OB History    Gravida  8   Para  4   Term  4   Preterm  0   AB  4   Living  4     SAB  3   IAB  1   Ectopic  0   Multiple  0   Live Births  4           Family History  Problem Relation Age of Onset  . Hypertension Father   . Stroke Father   . Heart disease Father   . Dementia Mother   . Cancer Maternal Aunt        Breast  . HIV Maternal Aunt   . Stroke Maternal Uncle   . Cancer Paternal Uncle        Colon  . Cancer Maternal Grandmother   . Hypertension Paternal Grandmother   . Cancer Paternal Grandfather        Colon    Social History   Tobacco Use  . Smoking status: Current Every Day Smoker    Packs/day: 0.50    Types: Cigarettes  . Smokeless tobacco: Never Used  Vaping Use  . Vaping Use: Former  Substance Use Topics  . Alcohol use: No  . Drug use: No    Home Medications Prior to Admission medications   Not on File    Allergies    Patient has no known  allergies.  Review of Systems   Review of Systems  Constitutional: Negative for chills and fever.  HENT: Negative.   Respiratory: Positive for cough ("smokers" cough, unchanged today). Negative for shortness of breath.   Cardiovascular: Positive for chest pain.  Gastrointestinal: Positive for abdominal pain. Negative for nausea.  Musculoskeletal: Negative.   Skin: Negative.   Neurological: Negative.  Negative for weakness and numbness.    Physical Exam Updated Vital Signs BP 129/80 (BP Location: Left Arm)   Pulse 93   Temp 98.1 F (36.7 C) (Oral)   Resp 16   LMP 11/26/2020 Comment: Tube Ligation   SpO2 100%   Physical Exam Vitals and nursing note reviewed.  Constitutional:      Appearance: She is well-developed.  HENT:     Head: Normocephalic.  Cardiovascular:     Rate and Rhythm: Normal rate and regular rhythm.     Heart  sounds: No murmur heard.   Pulmonary:     Effort: Pulmonary effort is normal.     Breath sounds: Normal breath sounds. No wheezing, rhonchi or rales.  Chest:     Chest wall: No tenderness.  Abdominal:     General: Bowel sounds are normal.     Palpations: Abdomen is soft.     Tenderness: There is no guarding or rebound.       Comments: Tender to palpation of bilateral upper abdomen over lower rib cage.   Musculoskeletal:        General: Normal range of motion.     Cervical back: Normal range of motion and neck supple.  Skin:    General: Skin is warm and dry.     Findings: No rash.  Neurological:     Mental Status: She is alert and oriented to person, place, and time.     ED Results / Procedures / Treatments   Labs (all labs ordered are listed, but only abnormal results are displayed) Labs Reviewed  BASIC METABOLIC PANEL - Abnormal; Notable for the following components:      Result Value   Glucose, Bld 108 (*)    All other components within normal limits  CBC - Abnormal; Notable for the following components:   WBC 13.2 (*)    All other components within normal limits  LIPASE, BLOOD  I-STAT BETA HCG BLOOD, ED ([facility], [facility], [facility] ONLY)  TROPONIN I (HIGH SENSITIVITY)  TROPONIN I (HIGH SENSITIVITY)    EKG None  Radiology DG Chest 2 View  Result Date: 12/12/2020 CLINICAL DATA:  Chest pain EXAM: CHEST - 2 VIEW COMPARISON:  11/02/2020 FINDINGS: The heart size and mediastinal contours are within normal limits. Both lungs are clear. The visualized skeletal structures are unremarkable. IMPRESSION: No active cardiopulmonary disease. Electronically Signed   By: Jasmine Pang M.D.   On: 12/12/2020 00:10    Procedures Procedures   Medications Ordered in ED Medications  ondansetron (ZOFRAN-ODT) disintegrating tablet 4 mg (4 mg Oral Given 12/11/20 2356)    ED Course  I have reviewed the triage vital signs and the nursing notes.  Pertinent labs & imaging results that were available  during my care of the patient were reviewed by me and considered in my medical decision making (see chart for details).    MDM Rules/Calculators/A&P                          Patient to ED for further evaluation of ongoing chest pain radiating to left neck, now  with upper abdominal pain as detailed in the HPI.   Chart reviewed. She has multiple medical visits for chest pain with similar presentation. Seen by cardiology who ordered a monitor without finding, with 3 month follow up scheduled.   Labs, imaging, EKG reviewed and there are no concerning findings. Her chest discomfort is a chronic issue. Abdominal pain is felt to be musculoskeletal as it is reproducible, worse with cough.   She is felt stable for discharge home. Encouraged PCP follow up for ongoing evaluation if symptoms persist.   Final Clinical Impression(s) / ED Diagnoses Final diagnoses:  None   1. Nonspecific chest pain 2. Abdominal wall pain  Rx / DC Orders ED Discharge Orders    None       Elpidio Anis, Cordelia Poche 12/12/20 2831    Zadie Rhine, MD 12/12/20 [phone]

## 2020-12-12 NOTE — Discharge Instructions (Addendum)
Your tests are all reassuring tonight. Suspect your new abdominal pain is musculoskeletal in nature requiring supportive care - continue warm compresses, ibuprofen/Tylenol.   Follow up with your primary care doctor for further evaluation.

## 2020-12-13 ENCOUNTER — Telehealth: Payer: Self-pay | Admitting: *Deleted

## 2020-12-13 NOTE — Telephone Encounter (Signed)
Transition Care Management Follow-up Telephone Call  Date of discharge and from where: 12-12-2020  [facility]  How have you been since you were released from the hospital? Feeling a little better  Any questions or concerns? No  Items Reviewed:  Did the pt receive and understand the discharge instructions provided? Yes   Medications obtained and verified? N/A  Other? NO  Any new allergies since your discharge? No   Dietary orders reviewed? N/A  Do you have support at home? Yes    Functional Questionnaire: (I = Independent and D = Dependent) ADLs: I  Bathing/Dressing- I  Meal Prep- I  Eating- I  Maintaining continence- I  Transferring/Ambulation- I  Managing Meds- I  Follow up appointments reviewed:   PCP Hospital f/u appt confirmed? Yes  patient waiting on time for [facility] f/u appt confirmed? No    Are transportation arrangements needed? No   If their condition worsens, is the pt aware to call PCP or go to the Emergency Dept.?yes  Was the patient provided with contact information for the PCP's office or ED?yes  Was to pt encouraged to call back with questions or concerns? yes

## 2020-12-27 ENCOUNTER — Ambulatory Visit (INDEPENDENT_AMBULATORY_CARE_PROVIDER_SITE_OTHER): Payer: Medicaid Other | Admitting: Primary Care

## 2020-12-27 ENCOUNTER — Other Ambulatory Visit: Payer: Self-pay

## 2020-12-27 ENCOUNTER — Encounter (INDEPENDENT_AMBULATORY_CARE_PROVIDER_SITE_OTHER): Payer: Self-pay | Admitting: Primary Care

## 2020-12-27 VITALS — BP 109/68 | HR 92 | Temp 91.8°F | Ht 63.0 in | Wt 170.8 lb

## 2020-12-27 DIAGNOSIS — R519 Headache, unspecified: Secondary | ICD-10-CM

## 2020-12-27 DIAGNOSIS — G8929 Other chronic pain: Secondary | ICD-10-CM

## 2020-12-27 DIAGNOSIS — Z09 Encounter for follow-up examination after completed treatment for conditions other than malignant neoplasm: Secondary | ICD-10-CM | POA: Diagnosis not present

## 2020-12-27 MED ORDER — IBUPROFEN 600 MG PO TABS: 600.0000 mg | ORAL_TABLET | Freq: Three times a day (TID) | ORAL | 1 refills | Status: DC | PRN

## 2020-12-27 NOTE — Patient Instructions (Signed)

## 2020-12-27 NOTE — Progress Notes (Signed)
CC:    HPI    Ms. Brooke Carrillo 34 y.o.female presents for follow up from the Hospital discharge  12/11/20, patient was discharged from the hospital on 12/12/20, patient was admitted for: Nonspecific chest pain. Abdominal wall pain. Patient has migraines with an aura she drinks 6-8 soda a day but has decrease by 1 a day. On follow up we have agreed to decrease to 2 a day.  Past Medical History:  Diagnosis Date  . Headache   . Pregnancy as incidental finding      No Known Allergies    No current outpatient medications on file prior to visit.   No current facility-administered medications on file prior to visit.    ROS: all negative except above.   Physical Exam: Filed Weights   12/27/20 0948  Weight: 170 lb 12.8 oz (77.5 kg)   BP 109/68 (BP Location: Right Arm, Patient Position: Sitting, Cuff Size: Normal)   Pulse 92   Temp (!) 91.8 F (33.2 C) (Temporal)   Ht 5\' 3"  (1.6 m)   Wt 170 lb 12.8 oz (77.5 kg)   LMP 12/16/2020 (Exact Date)   SpO2 95%   BMI 30.26 kg/m  General Appearance: Well nourished, in no apparent distress. Eyes: PERRLA, EOMs, conjunctiva no swelling or erythema Sinuses: No Frontal/maxillary tenderness ENT/Mouth: Ext aud canals clear, TMs without erythema, bulging. No erythema, swelling, or exudate on post pharynx.  Tonsils not swollen or erythematous. Hearing normal.  Neck: Supple, thyroid normal.  Respiratory: Respiratory effort normal, BS equal bilaterally without rales, rhonchi, wheezing or stridor.  Cardio: RRR with no MRGs. Brisk peripheral pulses without edema.  Abdomen: Soft, + BS.  Non tender, no guarding, rebound, hernias, masses. Lymphatics: Non tender without lymphadenopathy.  Musculoskeletal: Full ROM, 5/5 strength, normal gait.  Skin: Warm, dry without rashes, lesions, ecchymosis.  Neuro: Cranial nerves intact. Normal muscle tone, no cerebellar symptoms. Sensation intact.  Psych: Awake and oriented X 3, normal affect, Insight and Judgment  appropriate.    Brooke Carrillo was seen today for hospitalization follow-up.  Diagnoses and all orders for this visit:  Chronic nonintractable headache, unspecified headache type -     ibuprofen (ADVIL) 600 MG tablet; Take 1 tablet (600 mg total) by mouth every 8 (eight) hours as needed.  Hospital discharge follow-up He has  12/18/2020, NP 10:18 AM

## 2020-12-31 ENCOUNTER — Ambulatory Visit (INDEPENDENT_AMBULATORY_CARE_PROVIDER_SITE_OTHER): Payer: Medicaid Other | Admitting: Primary Care

## 2021-01-17 NOTE — Progress Notes (Signed)
NEUROLOGY CONSULTATION NOTE  Brooke Carrillo MRN: [id number] DOB: [birth year]  Referring provider: Gwinda Passe, NP Primary care provider: Gwinda Passe, NP  Reason for consult:  Migraines.  Assessment/Plan:   1. Chiari Malformation, type 1 - endorses some symptoms that may be seen in symptomatic Chiari (occipital headache aggravated by valsalva, neck pain, numbness of extremities, balance problems).  She has other symptoms not seen with Chiari, such as chest pain and whiteout of vision, which may be related to anxiety.  1.  I will refer the patient to neurosurgery for their opinion 2.  Further recommendations pending   Subjective:  Brooke Carrillo is a 35 year old right-handed female who presents for migraines.  History supplemented by hospital and referring provider's notes.  In November, she woke up on morning with numbness in her right arm.  She was told it was likely a pinched nerve in the neck.  She then developed numbness in the left hand followed by numbness in both legs.  Sometimes she notes weakness of the extremities as well.  Balance is off.  She then developed a severe posterior pounding headache lasting all day and occurring about once a week.  No prior history of headaches.  Ibuprofen 600mg  is ineffective.  Headaches are aggravated by valsalva, such as straining on the commode.  She has had brief episodes of whiteout of vision lasting 10-15 seconds and not accompanied by headache.  On a couple of occasions she reports some difficulty swallowing or hoarse voice.   She also had been experiencing chest pain on and off.  Cardiology workup was unremarkable  She went to the ED on 12/08/2020.  MRI of brain without contrast showed a Chiari 1 malformation with cerebellar tonsils extending 9 mm below foramen magnum.   Current NSAIDS/analgesics:  Ibuprofen 600mg  Current triptans:  none Current ergotamine:  none Current anti-emetic:  none Current muscle relaxants:  none Current  Antihypertensive medications:  none Current Antidepressant medications:  none Current Anticonvulsant medications:  none Current anti-CGRP:  none Current Vitamins/Herbal/Supplements:  none Current Antihistamines/Decongestants:  none Other therapy:  none Hormone/birth control:  none     PAST MEDICAL HISTORY: Past Medical History:  Diagnosis Date  . Headache   . Pregnancy as incidental finding     PAST SURGICAL HISTORY: Past Surgical History:  Procedure Laterality Date  . CHOLECYSTECTOMY N/A 08/27/2020   Procedure: LAPAROSCOPIC CHOLECYSTECTOMY;  Surgeon: , MD;  Location: [facility] OR;  Service: General;  Laterality: N/A;  . LAPAROSCOPIC TUBAL LIGATION Bilateral 07/23/2018   Procedure: LAPAROSCOPIC TUBAL LIGATION;  Surgeon: [facility], MD;  Location: [facility] ORS;  Service: Gynecology;  Laterality: Bilateral;  . ORIF FINGER / THUMB FRACTURE    . WISDOM TOOTH EXTRACTION      MEDICATIONS: Current Outpatient Medications on File Prior to Visit  Medication Sig Dispense Refill  . ibuprofen (ADVIL) 600 MG tablet Take 1 tablet (600 mg total) by mouth every 8 (eight) hours as needed. 60 tablet 1   No current facility-administered medications on file prior to visit.    ALLERGIES: No Known Allergies  FAMILY HISTORY: Family History  Problem Relation Age of Onset  . Hypertension Father   . Stroke Father   . Heart disease Father   . Dementia Mother   . Cancer Maternal Aunt        Breast  . HIV Maternal Aunt   . Stroke Maternal Uncle   . Cancer Paternal Uncle        Colon  .  Cancer Maternal Grandmother   . Hypertension Paternal Grandmother   . Cancer Paternal Grandfather        Colon    Objective:  Blood pressure 110/75, pulse 89, height 5\' 6"  (1.676 m), weight 168 lb (76.2 kg), SpO2 97 %. General: No acute distress.  Patient appears well-groomed.   Head:  Normocephalic/atraumatic Eyes:  fundi examined but not visualized Neck: supple, no paraspinal tenderness,  full range of motion Back: No paraspinal tenderness Heart: regular rate and rhythm Lungs: Clear to auscultation bilaterally. Vascular: No carotid bruits. Neurological Exam: Mental status: alert and oriented to person, place, and time, recent and remote memory intact, fund of knowledge intact, attention and concentration intact, speech fluent and not dysarthric, language intact. Cranial nerves: CN I: not tested CN II: pupils equal, round and reactive to light, visual fields intact CN III, IV, VI:  full range of motion, no nystagmus, no ptosis CN V: facial sensation intact. CN VII: upper and lower face symmetric CN VIII: hearing intact CN IX, X: gag intact, uvula midline CN XI: sternocleidomastoid and trapezius muscles intact CN XII: tongue midline Bulk & Tone: normal, no fasciculations. Motor:  muscle strength 5/5 throughout Sensation:  Pinprick, temperature and vibratory sensation intact. Deep Tendon Reflexes:  2+ throughout,  toes downgoing.   Finger to nose testing:  Without dysmetria.   Heel to shin:  Without dysmetria.   Gait:  Normal station and stride.  Romberg negative.    Thank you for allowing me to take part in the care of this patient.  , DO  CC: Shon Millet, NP

## 2021-01-18 ENCOUNTER — Ambulatory Visit (INDEPENDENT_AMBULATORY_CARE_PROVIDER_SITE_OTHER): Payer: Medicaid Other | Admitting: Neurology

## 2021-01-18 ENCOUNTER — Other Ambulatory Visit: Payer: Self-pay

## 2021-01-18 ENCOUNTER — Encounter: Payer: Self-pay | Admitting: Neurology

## 2021-01-18 VITALS — BP 110/75 | HR 89 | Ht 66.0 in | Wt 168.0 lb

## 2021-01-18 DIAGNOSIS — G935 Compression of brain: Secondary | ICD-10-CM

## 2021-01-18 NOTE — Patient Instructions (Signed)
1. We will refer you to neurosurgery for evaluation of Chiari Malformation 2.  Further recommendations pending their impression

## 2021-01-30 ENCOUNTER — Other Ambulatory Visit: Payer: Self-pay | Admitting: Neurological Surgery

## 2021-02-06 ENCOUNTER — Ambulatory Visit: Payer: Medicaid Other | Admitting: Neurology

## 2021-03-04 NOTE — Progress Notes (Deleted)
Cardiology Office Note:    Date:  03/04/2021   ID:  Brooke Carrillo, DOB Dec 26, 1985, MRN [id number]  PCP:  Grayce Sessions, NP  [facility] Cardiologist:  Christell Constant, MD  [facility] Electrophysiologist:  None   CC: Chest pain; pre-eval for suboccipital crainotomy for chiari decompression  History of Present Illness:    Brooke Carrillo is a 35 y.o. female with a hx of early menarche (age 15) and tobacco abuse who presented for evaluation 11/08/20. In interim of this visit, patient had benign heart monitor.  Has surgery planned for 03/2021.    Patient notes that she is doing ***.  Since day prior/last visit notes *** changes.  Relevant interval testing or therapy include ***.  There are no*** interval hospital/ED visit.    No chest pain or pressure ***.  No SOB/DOE*** and no PND/Orthopnea***.  No weight gain or leg swelling***.  No palpitations or syncope ***.  Ambulatory blood pressure ***.  Past Medical History:  Diagnosis Date  . Headache   . Pregnancy as incidental finding     Past Surgical History:  Procedure Laterality Date  . CHOLECYSTECTOMY N/A 08/27/2020   Procedure: LAPAROSCOPIC CHOLECYSTECTOMY;  Surgeon: [name], MD;  Location: [facility] OR;  Service: General;  Laterality: N/A;  . LAPAROSCOPIC TUBAL LIGATION Bilateral 07/23/2018   Procedure: LAPAROSCOPIC TUBAL LIGATION;  Surgeon: Hermina Staggers, MD;  Location: [facility] ORS;  Service: Gynecology;  Laterality: Bilateral;  . ORIF FINGER / THUMB FRACTURE    . WISDOM TOOTH EXTRACTION      Current Medications: No outpatient medications have been marked as taking for the 03/05/21 encounter (Appointment) with Christell Constant, MD.     Allergies:   Patient has no known allergies.   Social History   Socioeconomic History  . Marital status: Married    Spouse name: Not on file  . Number of children: Not on file  . Years of education: Not on file  . Highest education level: Not on file  Occupational  History  . Not on file  Tobacco Use  . Smoking status: Current Every Day Smoker    Packs/day: 0.50    Types: Cigarettes  . Smokeless tobacco: Never Used  Vaping Use  . Vaping Use: Former  Substance and Sexual Activity  . Alcohol use: No  . Drug use: No  . Sexual activity: Yes    Birth control/protection: None  Other Topics Concern  . Not on file  Social History Narrative   Right handed    Social Determinants of Health   Financial Resource Strain: Not on file  Food Insecurity: Not on file  Transportation Needs: Not on file  Physical Activity: Not on file  Stress: Not on file  Social Connections: Not on file     Family History: The patient's family history includes Cancer in her maternal aunt, maternal grandmother, paternal grandfather, and paternal uncle; Dementia in her mother; HIV in her maternal aunt; Heart disease in her father; Hypertension in her father and paternal grandmother; Stroke in her father and maternal uncle. History of coronary artery disease notable for no members. History of heart failure notable for father. No history of cardiomyopathies including hypertrophic cardiomyopathy, left ventricular non-compaction, or arrhythmogenic right ventricular cardiomyopathy. History of arrhythmia notable for sister (SVT and PVCs). Denies family history of sudden cardiac death including drowning, car accidents, or unexplained deaths in the family. No history of bicuspid aortic valve or aortic aneurysm or dissection.  ROS:  Please see the history of present illness.     All other systems reviewed and are negative.  EKGs/Labs/Other Studies Reviewed:    The following studies were reviewed today:  EKG:   11/05/20: Sinus Tachycardia Rate 101 WNL  Cardiac Event Monitoring: Date:12/05/20 Results:  Patient had a minimum heart rate of 42 bpm, maximum heart rate of 159 bpm, and average heart rate of 71 bpm.  Predominant underlying rhythm was sinus rhythm.  Isolated PACs  were rare (<1.0%).  Isolated PVCs were rare (<1.0%), with rare triplets present.  No evidence of complete heart block.  Inverted placement of device suspected.  Triggered and diary events associated with sinus rhythm and sinus tachycardia.   No malignant arrhythmias.   Recent Labs: 08/27/2020: TSH 0.483 12/08/2020: ALT 9; Magnesium 1.9 12/11/2020: BUN 8; Creatinine, Ser 0.81; Hemoglobin 13.8; Platelets 284; Potassium 4.8; Sodium 139  Recent Lipid Panel No results found for: CHOL, TRIG, HDL, CHOLHDL, VLDL, LDLCALC, LDLDIRECT   Risk Assessment/Calculations:     N/A  Physical Exam:    VS:  There were no vitals taken for this visit.    Wt Readings from Last 3 Encounters:  01/18/21 76.2 kg  12/27/20 77.5 kg  11/29/20 72.7 kg     GEN:  Well nourished, well developed in no acute distress HEENT: Normal NECK: No JVD;  *** LYMPHATICS: No lymphadenopathy CARDIAC: RRR, no murmurs, rubs, gallops RESPIRATORY:  Clear to auscultation without rales, wheezing or rhonchi  ABDOMEN: Soft, non-tender, non-distended MUSCULOSKELETAL:  No edema; No deformity  SKIN: Warm and dry NEUROLOGIC:  Alert and oriented x 3 PSYCHIATRIC:  Normal affect   ASSESSMENT:    No diagnosis found. PLAN:    In order of problems listed above:  Chest Pain Syndrome  Palpitations; possible SVT Tobacco Abuse Chiari Malformation with 7/22 upcoming surgery   Medication Adjustments/Labs and Tests Ordered: Current medicines are reviewed at length with the patient today.  Concerns regarding medicines are outlined above.  No orders of the defined types were placed in this encounter.  No orders of the defined types were placed in this encounter.   There are no Patient Instructions on file for this visit.   Signed, Christell Constant, MD  03/04/2021 7:47 AM    [facility]

## 2021-03-05 ENCOUNTER — Ambulatory Visit: Payer: Medicaid Other | Admitting: Internal Medicine

## 2021-04-03 NOTE — Progress Notes (Signed)
Surgical Instructions    Your procedure is scheduled on 04/08/21.  Report to [facility] Main Entrance "A" at 06:00 A.M., then check in with the Admitting office.  Call this number if you have problems the morning of surgery:  [phone]   If you have any questions prior to your surgery date call [phone]: Open Monday-Friday 8am-4pm    Remember:  Do not eat or drink after midnight the night before your surgery    Take these medicines the morning of surgery with A SIP OF WATER: NONE   As of today, STOP taking any Aspirin (unless otherwise instructed by your surgeon) Aleve, Naproxen, Ibuprofen, Motrin, Advil, Goody's, BC's, all herbal medications, fish oil, and all vitamins.          Do not wear jewelry or makeup Do not wear lotions, powders, perfumes/colognes, or deodorant. Do not shave 48 hours prior to surgery.  Men may shave face and neck. Do not bring valuables to the hospital.  DO Not wear nail polish, gel polish, artificial nails, or any other type of covering on natural nails  including finger and toenails. If patients have artificial nails, gel coating, etc. that need to be removed by a nail salon please have this removed prior to surgery or surgery may need to be canceled/delayed if the surgeon/ anesthesia feels like the patient is unable to be adequately monitored.             [facility] is not responsible for any belongings or valuables.  Do NOT Smoke (Tobacco/Vaping) or drink Alcohol 24 hours prior to your procedure If you use a CPAP at night, you may bring all equipment for your overnight stay.   Contacts, glasses, dentures or bridgework may not be worn into surgery, please bring cases for these belongings   For patients admitted to the hospital, discharge time will be determined by your treatment team.   Patients discharged the day of surgery will not be allowed to drive home, and someone needs to stay with them for 24 hours.  ONLY 1 SUPPORT PERSON MAY BE  PRESENT WHILE YOU ARE IN SURGERY. IF YOU ARE TO BE ADMITTED ONCE YOU ARE IN YOUR ROOM YOU WILL BE ALLOWED TWO (2) VISITORS.  Minor children may have two parents present. Special consideration for safety and communication needs will be reviewed on a case by case basis.  Special instructions:    Oral Hygiene is also important to reduce your risk of infection.  Remember - BRUSH YOUR TEETH THE MORNING OF SURGERY WITH YOUR REGULAR TOOTHPASTE   [place]- Preparing For Surgery  Before surgery, you can play an important role. Because skin is not sterile, your skin needs to be as free of germs as possible. You can reduce the number of germs on your skin by washing with CHG (chlorahexidine gluconate) Soap before surgery.  CHG is an antiseptic cleaner which kills germs and bonds with the skin to continue killing germs even after washing.     Please do not use if you have an allergy to CHG or antibacterial soaps. If your skin becomes reddened/irritated stop using the CHG.  Do not shave (including legs and underarms) for at least 48 hours prior to first CHG shower. It is OK to shave your face.  Please follow these instructions carefully.     Shower the NIGHT BEFORE SURGERY and the MORNING OF SURGERY with CHG Soap.   If you chose to wash your hair, wash your hair first as usual  with your normal shampoo. After you shampoo, rinse your hair and body thoroughly to remove the shampoo.  Then [employer] and genitals (private parts) with your normal soap and rinse thoroughly to remove soap.  After that Use CHG Soap as you would any other liquid soap. You can apply CHG directly to the skin and wash gently with a scrungie or a clean washcloth.   Apply the CHG Soap to your body ONLY FROM THE NECK DOWN.  Do not use on open wounds or open sores. Avoid contact with your eyes, ears, mouth and genitals (private parts). Wash Face and genitals (private parts)  with your normal soap.   Wash thoroughly, paying special  attention to the area where your surgery will be performed.  Thoroughly rinse your body with warm water from the neck down.  DO NOT shower/wash with your normal soap after using and rinsing off the CHG Soap.  Pat yourself dry with a CLEAN TOWEL.  Wear CLEAN PAJAMAS to bed the night before surgery  Place CLEAN SHEETS on your bed the night before your surgery  DO NOT SLEEP WITH PETS.   Day of Surgery: Take a shower with CHG soap. Wear Clean/Comfortable clothing the morning of surgery Do not apply any deodorants/lotions.   Remember to brush your teeth WITH YOUR REGULAR TOOTHPASTE.   Please read over the following fact sheets that you were given.

## 2021-04-04 ENCOUNTER — Encounter (HOSPITAL_COMMUNITY): Payer: Self-pay

## 2021-04-04 ENCOUNTER — Encounter (HOSPITAL_COMMUNITY)
Admission: RE | Admit: 2021-04-04 | Discharge: 2021-04-04 | Disposition: A | Discharge status: Home / Self Care | Payer: Medicaid Other | Source: Ambulatory Visit | Attending: Neurological Surgery | Admitting: Neurological Surgery

## 2021-04-04 ENCOUNTER — Other Ambulatory Visit: Payer: Self-pay

## 2021-04-04 DIAGNOSIS — Z20822 Contact with and (suspected) exposure to covid-19: Secondary | ICD-10-CM | POA: Insufficient documentation

## 2021-04-04 DIAGNOSIS — Z01812 Encounter for preprocedural laboratory examination: Secondary | ICD-10-CM | POA: Insufficient documentation

## 2021-04-04 LAB — CBC
HCT: 43.7 % (ref 36.0–46.0)
Hemoglobin: 14.7 g/dL (ref 12.0–15.0)
MCH: 32.1 pg (ref 26.0–34.0)
MCHC: 33.6 g/dL (ref 30.0–36.0)
MCV: 95.4 fL (ref 80.0–100.0)
Platelets: 355 10*3/uL (ref 150–400)
RBC: 4.58 MIL/uL (ref 3.87–5.11)
RDW: 12.2 % (ref 11.5–15.5)
WBC: 10.1 10*3/uL (ref 4.0–10.5)
nRBC: 0 % (ref 0.0–0.2)

## 2021-04-04 LAB — TYPE AND SCREEN
ABO/RH(D): O NEG
Antibody Screen: NEGATIVE

## 2021-04-04 NOTE — Progress Notes (Signed)
PCP - Gwinda Passe, NP Cardiologist - Riley Lam, MD Neurologist- Shon Millet, DO  PPM/ICD - denies  Chest x-ray - 12/12/20 EKG - 12/12/20 Stress Test - pt states "years ago". 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 She stated she is done using as of last night (7/6). Pt stated she used due to pain and not being able to obtain any pain medication when she reached out to her surgeon's office. Will be contacting Dr. Marcy Siren office to make them aware.  Patient denies shortness of breath, fever, cough and chest pain at PAT appointment   All instructions explained to the patient, with a verbal understanding of the material. Patient agrees to go over the instructions while at home for a better understanding. The opportunity to ask questions was provided.

## 2021-04-05 LAB — SARS CORONAVIRUS 2 (TAT 6-24 HRS): SARS Coronavirus 2: NEGATIVE

## 2021-04-08 ENCOUNTER — Other Ambulatory Visit: Payer: Self-pay

## 2021-04-08 ENCOUNTER — Encounter (HOSPITAL_COMMUNITY): Payer: Self-pay | Admitting: Neurological Surgery

## 2021-04-08 ENCOUNTER — Inpatient Hospital Stay (HOSPITAL_COMMUNITY)
Admission: RE | Admit: 2021-04-08 | Discharge: 2021-04-10 | DRG: 027 | Disposition: A | Discharge status: Home / Self Care | Payer: Medicaid Other | Attending: Neurological Surgery | Admitting: Neurological Surgery

## 2021-04-08 ENCOUNTER — Encounter (HOSPITAL_COMMUNITY)
Admission: RE | Disposition: A | Discharge status: Home / Self Care | Payer: Self-pay | Source: Home / Self Care | Attending: Neurological Surgery

## 2021-04-08 ENCOUNTER — Inpatient Hospital Stay (HOSPITAL_COMMUNITY): Payer: Medicaid Other | Admitting: Vascular Surgery

## 2021-04-08 ENCOUNTER — Inpatient Hospital Stay (HOSPITAL_COMMUNITY): Payer: Medicaid Other

## 2021-04-08 DIAGNOSIS — F17210 Nicotine dependence, cigarettes, uncomplicated: Secondary | ICD-10-CM | POA: Diagnosis present

## 2021-04-08 DIAGNOSIS — Z823 Family history of stroke: Secondary | ICD-10-CM | POA: Diagnosis not present

## 2021-04-08 DIAGNOSIS — Z8249 Family history of ischemic heart disease and other diseases of the circulatory system: Secondary | ICD-10-CM | POA: Diagnosis not present

## 2021-04-08 DIAGNOSIS — Z803 Family history of malignant neoplasm of breast: Secondary | ICD-10-CM | POA: Diagnosis not present

## 2021-04-08 DIAGNOSIS — G935 Compression of brain: Principal | ICD-10-CM | POA: Diagnosis present

## 2021-04-08 HISTORY — PX: SUBOCCIPITAL CRANIECTOMY CERVICAL LAMINECTOMY: SHX5404

## 2021-04-08 LAB — CREATININE, SERUM
Creatinine, Ser: 0.84 mg/dL (ref 0.44–1.00)
GFR, Estimated: >60 mL/min (ref >60)

## 2021-04-08 LAB — MRSA NEXT GEN BY PCR, NASAL: MRSA by PCR Next Gen: NOT DETECTED

## 2021-04-08 LAB — CBC
HCT: 37.1 % (ref 36.0–46.0)
Hemoglobin: 12.6 g/dL (ref 12.0–15.0)
MCH: 32 pg (ref 26.0–34.0)
MCHC: 34 g/dL (ref 30.0–36.0)
MCV: 94.2 fL (ref 80.0–100.0)
Platelets: 304 10*3/uL (ref 150–400)
RBC: 3.94 MIL/uL (ref 3.87–5.11)
RDW: 11.9 % (ref 11.5–15.5)
WBC: 11.7 10*3/uL — ABNORMAL HIGH (ref 4.0–10.5)
nRBC: 0 % (ref 0.0–0.2)

## 2021-04-08 LAB — POCT PREGNANCY, URINE
Preg Test, Ur: NEGATIVE
Preg Test, Ur: NEGATIVE

## 2021-04-08 SURGERY — SUBOCCIPITAL CRANIECTOMY CERVICAL LAMINECTOMY/DURAPLASTY
Anesthesia: General

## 2021-04-08 MED ORDER — CHLORHEXIDINE GLUCONATE 0.12 % MT SOLN
OROMUCOSAL | Status: AC
  Administered 2021-04-08: 15 mL via OROMUCOSAL
  Filled 2021-04-08: qty 15

## 2021-04-08 MED ORDER — LACTATED RINGERS IV SOLN: INTRAVENOUS | Status: DC

## 2021-04-08 MED ORDER — ONDANSETRON HCL 4 MG/2ML IJ SOLN
4.0000 mg | Freq: Four times a day (QID) | INTRAMUSCULAR | Status: DC | PRN
  Administered 2021-04-08: 4 mg via INTRAVENOUS
  Filled 2021-04-08: qty 2

## 2021-04-08 MED ORDER — ACETAMINOPHEN 10 MG/ML IV SOLN: 1000.0000 mg | Freq: Once | INTRAVENOUS | Status: DC | PRN

## 2021-04-08 MED ORDER — CHLORHEXIDINE GLUCONATE 0.12 % MT SOLN
15.0000 mL | Freq: Once | OROMUCOSAL | Status: AC
  Filled 2021-04-08: qty 15

## 2021-04-08 MED ORDER — PROPOFOL 500 MG/50ML IV EMUL
INTRAVENOUS | Status: DC | PRN
  Administered 2021-04-08: 250 ug/kg/min via INTRAVENOUS
  Administered 2021-04-08: 80 ug/kg/min via INTRAVENOUS
  Administered 2021-04-08: 120 mg via INTRAVENOUS

## 2021-04-08 MED ORDER — ONDANSETRON HCL 4 MG PO TABS: 4.0000 mg | ORAL_TABLET | Freq: Four times a day (QID) | ORAL | Status: DC | PRN

## 2021-04-08 MED ORDER — FENTANYL CITRATE (PF) 100 MCG/2ML IJ SOLN
25.0000 ug | INTRAMUSCULAR | Status: DC | PRN
  Administered 2021-04-08: 50 ug via INTRAVENOUS

## 2021-04-08 MED ORDER — SODIUM CHLORIDE 0.9 % IV SOLN: 250.0000 mL | INTRAVENOUS | Status: DC

## 2021-04-08 MED ORDER — CHLORHEXIDINE GLUCONATE CLOTH 2 % EX PADS: 6.0000 | MEDICATED_PAD | Freq: Once | CUTANEOUS | Status: DC

## 2021-04-08 MED ORDER — LIDOCAINE-EPINEPHRINE 1 %-1:100000 IJ SOLN
INTRAMUSCULAR | Status: DC | PRN
  Administered 2021-04-08: 4 mL

## 2021-04-08 MED ORDER — MENTHOL 3 MG MT LOZG: 1.0000 | LOZENGE | OROMUCOSAL | Status: DC | PRN

## 2021-04-08 MED ORDER — OXYCODONE HCL 5 MG PO TABS
10.0000 mg | ORAL_TABLET | ORAL | Status: DC | PRN
  Administered 2021-04-08 – 2021-04-10 (×10): 10 mg via ORAL
  Filled 2021-04-08 (×10): qty 2

## 2021-04-08 MED ORDER — THROMBIN 5000 UNITS EX SOLR
CUTANEOUS | Status: AC
  Filled 2021-04-08: qty 5000

## 2021-04-08 MED ORDER — HEPARIN SODIUM (PORCINE) 5000 UNIT/ML IJ SOLN
5000.0000 [IU] | Freq: Three times a day (TID) | INTRAMUSCULAR | Status: DC
  Administered 2021-04-10 (×2): 5000 [IU] via SUBCUTANEOUS
  Filled 2021-04-08 (×2): qty 1

## 2021-04-08 MED ORDER — FENTANYL CITRATE (PF) 100 MCG/2ML IJ SOLN
INTRAMUSCULAR | Status: AC
  Administered 2021-04-08: 50 ug via INTRAVENOUS
  Filled 2021-04-08: qty 2

## 2021-04-08 MED ORDER — PROPOFOL 10 MG/ML IV BOLUS
INTRAVENOUS | Status: AC
  Filled 2021-04-08: qty 40

## 2021-04-08 MED ORDER — ROCURONIUM BROMIDE 10 MG/ML (PF) SYRINGE
PREFILLED_SYRINGE | INTRAVENOUS | Status: DC | PRN
  Administered 2021-04-08: 20 mg via INTRAVENOUS
  Administered 2021-04-08: 30 mg via INTRAVENOUS
  Administered 2021-04-08: 70 mg via INTRAVENOUS
  Administered 2021-04-08: 20 mg via INTRAVENOUS

## 2021-04-08 MED ORDER — BUPIVACAINE HCL (PF) 0.5 % IJ SOLN
INTRAMUSCULAR | Status: DC | PRN
  Administered 2021-04-08: 4 mL

## 2021-04-08 MED ORDER — MIDAZOLAM HCL 2 MG/2ML IJ SOLN
INTRAMUSCULAR | Status: DC | PRN
  Administered 2021-04-08: 2 mg via INTRAVENOUS

## 2021-04-08 MED ORDER — ACETAMINOPHEN 160 MG/5ML PO SOLN: 1000.0000 mg | Freq: Once | ORAL | Status: DC | PRN

## 2021-04-08 MED ORDER — THROMBIN 20000 UNITS EX SOLR
CUTANEOUS | Status: DC | PRN
  Administered 2021-04-08: 20 mL via TOPICAL

## 2021-04-08 MED ORDER — ACETAMINOPHEN 650 MG RE SUPP: 650.0000 mg | RECTAL | Status: DC | PRN

## 2021-04-08 MED ORDER — GLYCOPYRROLATE PF 0.2 MG/ML IJ SOSY
PREFILLED_SYRINGE | INTRAMUSCULAR | Status: DC | PRN
  Administered 2021-04-08: .1 mg via INTRAVENOUS

## 2021-04-08 MED ORDER — PROPOFOL 10 MG/ML IV BOLUS
INTRAVENOUS | Status: DC | PRN
  Administered 2021-04-08: 160 mg via INTRAVENOUS

## 2021-04-08 MED ORDER — DEXAMETHASONE SODIUM PHOSPHATE 10 MG/ML IJ SOLN
INTRAMUSCULAR | Status: AC
  Filled 2021-04-08: qty 1

## 2021-04-08 MED ORDER — CHLORHEXIDINE GLUCONATE CLOTH 2 % EX PADS: 6.0000 | MEDICATED_PAD | Freq: Every day | CUTANEOUS | Status: DC

## 2021-04-08 MED ORDER — ACETAMINOPHEN 325 MG PO TABS
650.0000 mg | ORAL_TABLET | ORAL | Status: DC | PRN
  Administered 2021-04-08 – 2021-04-10 (×3): 650 mg via ORAL
  Filled 2021-04-08 (×3): qty 2

## 2021-04-08 MED ORDER — OXYCODONE HCL 5 MG/5ML PO SOLN: 5.0000 mg | Freq: Once | ORAL | Status: DC | PRN

## 2021-04-08 MED ORDER — SODIUM CHLORIDE 0.9 % IV SOLN: INTRAVENOUS | Status: DC

## 2021-04-08 MED ORDER — DOCUSATE SODIUM 100 MG PO CAPS
100.0000 mg | ORAL_CAPSULE | Freq: Two times a day (BID) | ORAL | Status: DC
  Administered 2021-04-08 – 2021-04-10 (×5): 100 mg via ORAL
  Filled 2021-04-08 (×5): qty 1

## 2021-04-08 MED ORDER — SUGAMMADEX SODIUM 200 MG/2ML IV SOLN
INTRAVENOUS | Status: DC | PRN
  Administered 2021-04-08: 200 mg via INTRAVENOUS

## 2021-04-08 MED ORDER — ESMOLOL HCL 100 MG/10ML IV SOLN
INTRAVENOUS | Status: AC
  Filled 2021-04-08: qty 10

## 2021-04-08 MED ORDER — THROMBIN 20000 UNITS EX SOLR
CUTANEOUS | Status: AC
  Filled 2021-04-08: qty 20000

## 2021-04-08 MED ORDER — SODIUM CHLORIDE 0.9% FLUSH: 3.0000 mL | INTRAVENOUS | Status: DC | PRN

## 2021-04-08 MED ORDER — CYCLOBENZAPRINE HCL 10 MG PO TABS
10.0000 mg | ORAL_TABLET | Freq: Three times a day (TID) | ORAL | Status: DC | PRN
  Administered 2021-04-08 – 2021-04-09 (×5): 10 mg via ORAL
  Filled 2021-04-08 (×5): qty 1

## 2021-04-08 MED ORDER — CEFAZOLIN SODIUM-DEXTROSE 2-4 GM/100ML-% IV SOLN
2.0000 g | Freq: Three times a day (TID) | INTRAVENOUS | Status: AC
  Administered 2021-04-08 – 2021-04-09 (×2): 2 g via INTRAVENOUS
  Filled 2021-04-08 (×2): qty 100

## 2021-04-08 MED ORDER — SODIUM CHLORIDE 0.9% FLUSH
3.0000 mL | Freq: Two times a day (BID) | INTRAVENOUS | Status: DC
  Administered 2021-04-08 – 2021-04-10 (×4): 3 mL via INTRAVENOUS

## 2021-04-08 MED ORDER — ORAL CARE MOUTH RINSE: 15.0000 mL | Freq: Once | OROMUCOSAL | Status: AC

## 2021-04-08 MED ORDER — PHENOL 1.4 % MT LIQD: 1.0000 | OROMUCOSAL | Status: DC | PRN

## 2021-04-08 MED ORDER — THROMBIN 5000 UNITS EX SOLR
OROMUCOSAL | Status: DC | PRN
  Administered 2021-04-08: 5 mL via TOPICAL

## 2021-04-08 MED ORDER — CHLORHEXIDINE GLUCONATE CLOTH 2 % EX PADS
6.0000 | MEDICATED_PAD | Freq: Every day | CUTANEOUS | Status: DC
  Administered 2021-04-08: 6 via TOPICAL

## 2021-04-08 MED ORDER — POLYETHYLENE GLYCOL 3350 17 G PO PACK: 17.0000 g | PACK | Freq: Every day | ORAL | Status: DC | PRN

## 2021-04-08 MED ORDER — LIDOCAINE-EPINEPHRINE 1 %-1:100000 IJ SOLN
INTRAMUSCULAR | Status: AC
  Filled 2021-04-08: qty 1

## 2021-04-08 MED ORDER — LIDOCAINE 2% (20 MG/ML) 5 ML SYRINGE
INTRAMUSCULAR | Status: DC | PRN
  Administered 2021-04-08: 60 mg via INTRAVENOUS

## 2021-04-08 MED ORDER — ROCURONIUM BROMIDE 10 MG/ML (PF) SYRINGE
PREFILLED_SYRINGE | INTRAVENOUS | Status: AC
  Filled 2021-04-08: qty 10

## 2021-04-08 MED ORDER — LIDOCAINE 2% (20 MG/ML) 5 ML SYRINGE
INTRAMUSCULAR | Status: AC
  Filled 2021-04-08: qty 5

## 2021-04-08 MED ORDER — DEXAMETHASONE SODIUM PHOSPHATE 10 MG/ML IJ SOLN
INTRAMUSCULAR | Status: DC | PRN
  Administered 2021-04-08: 10 mg via INTRAVENOUS

## 2021-04-08 MED ORDER — SODIUM CHLORIDE 0.9 % IV SOLN: INTRAVENOUS | Status: DC | PRN

## 2021-04-08 MED ORDER — BACITRACIN ZINC 500 UNIT/GM EX OINT
TOPICAL_OINTMENT | CUTANEOUS | Status: AC
  Filled 2021-04-08: qty 28.35

## 2021-04-08 MED ORDER — BUPIVACAINE HCL (PF) 0.5 % IJ SOLN
INTRAMUSCULAR | Status: AC
  Filled 2021-04-08: qty 30

## 2021-04-08 MED ORDER — MIDAZOLAM HCL 2 MG/2ML IJ SOLN
INTRAMUSCULAR | Status: AC
  Filled 2021-04-08: qty 2

## 2021-04-08 MED ORDER — OXYCODONE HCL 5 MG PO TABS: 5.0000 mg | ORAL_TABLET | ORAL | Status: DC | PRN

## 2021-04-08 MED ORDER — ACETAMINOPHEN 500 MG PO TABS: 1000.0000 mg | ORAL_TABLET | Freq: Once | ORAL | Status: DC | PRN

## 2021-04-08 MED ORDER — ONDANSETRON HCL 4 MG/2ML IJ SOLN
INTRAMUSCULAR | Status: AC
  Filled 2021-04-08: qty 2

## 2021-04-08 MED ORDER — CEFAZOLIN SODIUM-DEXTROSE 2-4 GM/100ML-% IV SOLN
2.0000 g | INTRAVENOUS | Status: AC
  Administered 2021-04-08: 2 g via INTRAVENOUS
  Filled 2021-04-08: qty 100

## 2021-04-08 MED ORDER — FENTANYL CITRATE (PF) 250 MCG/5ML IJ SOLN
INTRAMUSCULAR | Status: DC | PRN
  Administered 2021-04-08: 50 ug via INTRAVENOUS
  Administered 2021-04-08: 100 ug via INTRAVENOUS
  Administered 2021-04-08: 50 ug via INTRAVENOUS

## 2021-04-08 MED ORDER — ONDANSETRON HCL 4 MG/2ML IJ SOLN
INTRAMUSCULAR | Status: DC | PRN
  Administered 2021-04-08: 4 mg via INTRAVENOUS

## 2021-04-08 MED ORDER — SODIUM CHLORIDE 0.9 % IV SOLN
0.0500 ug/kg/min | INTRAVENOUS | Status: DC
  Administered 2021-04-08: .2 ug/kg/min via INTRAVENOUS
  Filled 2021-04-08: qty 5000

## 2021-04-08 MED ORDER — CEFAZOLIN SODIUM-DEXTROSE 2-4 GM/100ML-% IV SOLN
INTRAVENOUS | Status: AC
  Filled 2021-04-08: qty 100

## 2021-04-08 MED ORDER — FENTANYL CITRATE (PF) 250 MCG/5ML IJ SOLN
INTRAMUSCULAR | Status: AC
  Filled 2021-04-08: qty 5

## 2021-04-08 MED ORDER — OXYCODONE HCL 5 MG PO TABS: 5.0000 mg | ORAL_TABLET | Freq: Once | ORAL | Status: DC | PRN

## 2021-04-08 MED ORDER — ESMOLOL HCL 100 MG/10ML IV SOLN
INTRAVENOUS | Status: DC | PRN
  Administered 2021-04-08: 60 mg via INTRAVENOUS
  Administered 2021-04-08: 40 mg via INTRAVENOUS

## 2021-04-08 MED ORDER — HYDROMORPHONE HCL 1 MG/ML IJ SOLN
1.0000 mg | INTRAMUSCULAR | Status: DC | PRN
  Administered 2021-04-08 – 2021-04-09 (×8): 1 mg via INTRAVENOUS
  Filled 2021-04-08 (×8): qty 1

## 2021-04-08 SURGICAL SUPPLY — 62 items
ADH SKN CLS APL DERMABOND .7 (GAUZE/BANDAGES/DRESSINGS) ×1
APL SKNCLS STERI-STRIP NONHPOA (GAUZE/BANDAGES/DRESSINGS)
BAG COUNTER SPONGE SURGICOUNT (BAG) ×2 IMPLANT
BAG SPNG CNTER NS LX DISP (BAG) ×1
BAND INSRT 18 STRL LF DISP RB (MISCELLANEOUS)
BAND RUBBER #18 3X1/16 STRL (MISCELLANEOUS) IMPLANT
BENZOIN TINCTURE PRP APPL 2/3 (GAUZE/BANDAGES/DRESSINGS) IMPLANT
BLADE CLIPPER SURG (BLADE) ×4 IMPLANT
BLADE SURG 11 STRL SS (BLADE) ×2 IMPLANT
BLADE ULTRA TIP 2M (BLADE) IMPLANT
BUR ACORN 6.0 PRECISION (BURR) ×2 IMPLANT
BUR PRECISION FLUTE 5.0 (BURR) IMPLANT
CANISTER SUCT 3000ML PPV (MISCELLANEOUS) ×2 IMPLANT
CLIP VESOCCLUDE MED 6/CT (CLIP) ×1 IMPLANT
COVER BACK TABLE 60X90IN (DRAPES) IMPLANT
DERMABOND ADVANCED (GAUZE/BANDAGES/DRESSINGS) ×1
DERMABOND ADVANCED .7 DNX12 (GAUZE/BANDAGES/DRESSINGS) IMPLANT
DRAPE LAPAROTOMY 100X72 PEDS (DRAPES) ×2 IMPLANT
DRAPE MICROSCOPE LEICA (MISCELLANEOUS) IMPLANT
DRAPE WARM FLUID 44X44 (DRAPES) ×2 IMPLANT
DURAGUARD 04CMX04CM ×1 IMPLANT
DURAPREP 6ML APPLICATOR 50/CS (WOUND CARE) ×2 IMPLANT
ELECT REM PT RETURN 9FT ADLT (ELECTROSURGICAL) ×2
ELECTRODE REM PT RTRN 9FT ADLT (ELECTROSURGICAL) ×1 IMPLANT
EVACUATOR 1/8 PVC DRAIN (DRAIN) IMPLANT
EVACUATOR SILICONE 100CC (DRAIN) IMPLANT
GAUZE 4X4 16PLY ~~LOC~~+RFID DBL (SPONGE) IMPLANT
GAUZE SPONGE 4X4 12PLY STRL (GAUZE/BANDAGES/DRESSINGS) IMPLANT
GLOVE SURG LTX SZ7.5 (GLOVE) ×2 IMPLANT
GLOVE SURG UNDER POLY LF SZ7.5 (GLOVE) ×2 IMPLANT
GOWN STRL REUS W/ TWL LRG LVL3 (GOWN DISPOSABLE) ×1 IMPLANT
GOWN STRL REUS W/ TWL XL LVL3 (GOWN DISPOSABLE) IMPLANT
GOWN STRL REUS W/TWL 2XL LVL3 (GOWN DISPOSABLE) IMPLANT
GOWN STRL REUS W/TWL LRG LVL3 (GOWN DISPOSABLE) ×2
GOWN STRL REUS W/TWL XL LVL3 (GOWN DISPOSABLE) ×2
HEMOSTAT POWDER KIT SURGIFOAM (HEMOSTASIS) ×2 IMPLANT
HEMOSTAT SURGICEL 2X14 (HEMOSTASIS) IMPLANT
KIT BASIN OR (CUSTOM PROCEDURE TRAY) ×2 IMPLANT
KIT TURNOVER KIT B (KITS) ×2 IMPLANT
NDL HYPO 25X1 1.5 SAFETY (NEEDLE) ×1 IMPLANT
NEEDLE HYPO 25X1 1.5 SAFETY (NEEDLE) ×2 IMPLANT
NS IRRIG 1000ML POUR BTL (IV SOLUTION) ×3 IMPLANT
PACK CRANIOTOMY CUSTOM (CUSTOM PROCEDURE TRAY) ×2 IMPLANT
PAD ARMBOARD 7.5X6 YLW CONV (MISCELLANEOUS) ×6 IMPLANT
PATTIES SURGICAL .5 X1 (DISPOSABLE) IMPLANT
PATTIES SURGICAL 1/4 X 3 (GAUZE/BANDAGES/DRESSINGS) IMPLANT
SPONGE SURGIFOAM ABS GEL 100 (HEMOSTASIS) ×1 IMPLANT
SPONGE T-LAP 4X18 ~~LOC~~+RFID (SPONGE) IMPLANT
STAPLER SKIN PROX WIDE 3.9 (STAPLE) ×3 IMPLANT
SUT ETHILON 3 0 FSL (SUTURE) IMPLANT
SUT MON AB 3-0 SH 27 (SUTURE) ×2
SUT MON AB 3-0 SH27 (SUTURE) IMPLANT
SUT NURALON 4 0 TR CR/8 (SUTURE) ×2 IMPLANT
SUT PROLENE 6 0 BV (SUTURE) ×2 IMPLANT
SUT VIC AB 0 CT1 18XCR BRD8 (SUTURE) ×2 IMPLANT
SUT VIC AB 0 CT1 8-18 (SUTURE) ×2
SUT VIC AB 2-0 CP2 18 (SUTURE) ×4 IMPLANT
TOWEL GREEN STERILE (TOWEL DISPOSABLE) ×2 IMPLANT
TOWEL GREEN STERILE FF (TOWEL DISPOSABLE) IMPLANT
TRAY FOLEY MTR SLVR 16FR STAT (SET/KITS/TRAYS/PACK) ×1 IMPLANT
UNDERPAD 30X36 HEAVY ABSORB (UNDERPADS AND DIAPERS) IMPLANT
WATER STERILE IRR 1000ML POUR (IV SOLUTION) ×2 IMPLANT

## 2021-04-08 NOTE — Anesthesia Procedure Notes (Signed)
Arterial Line Insertion Start/End7/07/2021 8:15 AM, 04/08/2021 8:27 AM Performed by: CRNA  Patient location: Pre-op. Preanesthetic checklist: patient identified, IV checked, site marked, risks and benefits discussed, surgical consent, monitors and equipment checked, pre-op evaluation, timeout performed and anesthesia consent Lidocaine 1% used for infiltration Right, radial was placed Catheter size: 20 Fr Hand hygiene performed  and maximum sterile barriers used   Attempts: 1 Procedure performed without using ultrasound guided technique. Following insertion, dressing applied. Post procedure assessment: normal and unchanged  Patient tolerated the procedure well with no immediate complications. Additional procedure comments: Inserted by Gillis Ends, SRNA.

## 2021-04-08 NOTE — Op Note (Signed)
PATIENT: Brooke Carrillo  DAY OF SURGERY: 04/08/21   PRE-OPERATIVE DIAGNOSIS:  Chiari malformation   POST-OPERATIVE DIAGNOSIS:  Same   PROCEDURE:  Suboccipital craniectomy, decompression of chiari malformation with expansile duroplasty, partial C1 laminectomy   SURGEON:  Surgeon(s) and Role:    Jadene Pierini, MD - Primary    Tressie Stalker, MD - Assisting   ANESTHESIA: ETGA   BRIEF HISTORY: This is a 35 year old woman who presented with headaches, ataxia, visual symptoms, and BUE > BLE numbness. The patient was found to have a type 1 chiari malformation. I therefore recommended decompression to address her symptoms. This was discussed with the patient as well as risks, benefits, expectations regarding outcomes, and alternatives and wished to proceed with surgery.   OPERATIVE DETAIL: The patient was taken to the operating room, anesthesia was induced by the anesthesia team, the [place] head holder was placed, and placed on the OR table in the prone position. A formal time out was performed with two patient identifiers and confirmed the operative site. The operative site was marked, hair was clipped with surgical clippers, the area was then prepped and draped in a sterile fashion.   A linear midline incision was placed from just below the inion to C1. Subperiosteal dissection was performed, retractors were placed, and a suboccipital craniectomy was created using a high speed drill and rongeurs. Decompression was taken out laterally and caudally to decompress the foramen. The dura was opened in a Y-fashion. There was a large intra-dural venous lake at the lateral portion of the durotomy on the patient's right that required Weck clips. After completing the Y, more decompression was needed so I performed a partial C1 laminectomy and extended the durotomy downwards with an improvement in decompression. The dura was adherent to the arachnoid, but I was able to open it circumferentially without  opening the arachnoid except for one small pinhole inferiorly. Hemostasis was obtained and confirmed, then an expansile duroplasty was sewed in place with 6-0 Prolene, Dr. Lovell Sheehan scrubbed in to help with the microsurgical portion of the case. The wound was copiously irrigated and hemostasis was checked again. With a good dural closure, given the pinhole in the arachnoid, I did place Adheris fibrin sealant along the suture line to try to minimize the risk of pseudomeningocele.   All instrument and sponge counts were correct, the incision was then closed in layers. The patient was then returned to anesthesia for emergence. No apparent complications at the completion of the procedure.   EBL:    DRAINS: none   SPECIMENS: none   Jadene Pierini, MD 04/08/21 10:51 AM

## 2021-04-08 NOTE — Brief Op Note (Signed)
04/08/2021  10:50 AM  PATIENT:  [name]  35 y.o. female  PRE-OPERATIVE DIAGNOSIS:  Chiari type 1 Malformation  POST-OPERATIVE DIAGNOSIS:  Chiari type 1 Malformation  PROCEDURE:  Procedure(s): Suboccipital craniotomy for chiari decompression (N/A)  SURGEON:  Surgeon(s) and Role:    * Jadene Pierini, MD - Primary      Tressie Stalker, MD assistant  PHYSICIAN ASSISTANT:   ANESTHESIA:   general  EBL:  500 mL   BLOOD ADMINISTERED:none  DRAINS: none   LOCAL MEDICATIONS USED:  LIDOCAINE   SPECIMEN:  No Specimen  DISPOSITION OF SPECIMEN:  N/A  COUNTS:  YES  TOURNIQUET:  * No tourniquets in log *  DICTATION: .Note written in EPIC  PLAN OF CARE: Admit to inpatient   PATIENT DISPOSITION:  PACU - hemodynamically stable.   Delay start of Pharmacological VTE agent (>24hrs) due to surgical blood loss or risk of bleeding: yes

## 2021-04-08 NOTE — Transfer of Care (Signed)
Immediate Anesthesia Transfer of Care Note  Patient: Brooke Carrillo  Procedure(s) Performed: Suboccipital craniectomy for chiari decompression  Patient Location: PACU  Anesthesia Type:General  Level of Consciousness: awake, alert  and drowsy  Airway & Oxygen Therapy: Patient Spontanous Breathing and Patient connected to face mask oxygen  Post-op Assessment: Report given to RN and Post -op Vital signs reviewed and stable  Post vital signs: Reviewed and stable  Last Vitals:  Vitals Value Taken Time  BP 123/73 04/08/21 1106  Temp    Pulse 95 04/08/21 1106  Resp 16 04/08/21 1106  SpO2 100 % 04/08/21 1106  Vitals shown include unvalidated device data.  Last Pain:  Vitals:   04/08/21 0706  TempSrc:   PainSc: 4          Complications: No notable events documented.

## 2021-04-08 NOTE — Anesthesia Procedure Notes (Signed)
Procedure Name: Intubation Date/Time: 04/08/2021 8:04 AM Performed by: Trinna Post., CRNA Pre-anesthesia Checklist: Patient identified, Emergency Drugs available, Suction available and Patient being monitored Patient Re-evaluated:Patient Re-evaluated prior to induction Oxygen Delivery Method: Circle system utilized Preoxygenation: Pre-oxygenation with 100% oxygen Induction Type: IV induction Ventilation: Mask ventilation without difficulty Laryngoscope Size: Mac and 3 Grade View: Grade I Tube type: Oral Tube size: 7.0 mm Number of attempts: 1 Airway Equipment and Method: Stylet and Oral airway Placement Confirmation: ETT inserted through vocal cords under direct vision, positive ETCO2 and breath sounds checked- equal and bilateral Secured at: 22 cm Tube secured with: Tape Dental Injury: Teeth and Oropharynx as per pre-operative assessment  Comments: Inserted by Paulina Fusi, SRNA

## 2021-04-08 NOTE — H&P (Signed)
Surgical H&P Update  HPI: 35 y.o. woman with a history of headaches, BUE/BLE numbness, ataxia, and visual changes, found to have a type 1 chiari malformation. No changes in health since she was last seen. Still having symptoms and wishes to proceed with surgery.  PMHx:  Past Medical History:  Diagnosis Date   Headache    Pregnancy as incidental finding    FamHx:  Family History  Problem Relation Age of Onset   Hypertension Father    Stroke Father    Heart disease Father    Dementia Mother    Cancer Maternal Aunt        Breast   HIV Maternal Aunt    Stroke Maternal Uncle    Cancer Paternal Uncle        Colon   Cancer Maternal Grandmother    Hypertension Paternal Grandmother    Cancer Paternal Grandfather        Colon   SocHx:  reports that she has been smoking cigarettes. She has been smoking an average of 0.50 packs per day. She has never used smokeless tobacco. 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 She reports that she does not drink alcohol.  Physical Exam: AOx3, PERRL, FS, TM, speech fluent with normal content Strength 5/5 x4, diffuse BUE > BLE numbness, reflexes 1+  Assesment/Plan: 34 y.o. woman with chiari malformation, here for chiari decomrpession. Risks, benefits, expectations, and alternatives discussed and the patient would like to continue with surgery.  -OR today -4N post-op  Jadene Pierini, MD 04/08/21 7:26 AM

## 2021-04-08 NOTE — Anesthesia Preprocedure Evaluation (Signed)
Anesthesia Evaluation  Patient identified by MRN, date of birth, ID band Patient awake    Reviewed: Allergy & Precautions, NPO status , Patient's Chart, lab work & pertinent test results  History of Anesthesia Complications Negative for: history of anesthetic complications  Airway Mallampati: II  TM Distance: >3 FB Neck ROM: Full    Dental  (+) Dental Advisory Given, Teeth Intact   Pulmonary neg shortness of breath, neg COPD, neg recent URI, Current Smoker and Patient abstained from smoking.,    breath sounds clear to auscultation       Cardiovascular negative cardio ROS   Rhythm:Regular     Neuro/Psych  Headaches, neg Seizures Chiari type 1 Malformation negative psych ROS   GI/Hepatic negative GI ROS, Neg liver ROS,   Endo/Other  negative endocrine ROS  Renal/GU negative Renal ROS     Musculoskeletal negative musculoskeletal ROS (+)   Abdominal   Peds  Hematology negative hematology ROS (+)   Anesthesia Other Findings   Reproductive/Obstetrics                             Anesthesia Physical Anesthesia Plan  ASA: 2  Anesthesia Plan: General   Post-op Pain Management:    Induction: Intravenous  PONV Risk Score and Plan: 2 and Ondansetron, Dexamethasone, Propofol infusion and TIVA  Airway Management Planned: Oral ETT  Additional Equipment: Arterial line  Intra-op Plan:   Post-operative Plan: Extubation in OR  Informed Consent: I have reviewed the patients History and Physical, chart, labs and discussed the procedure including the risks, benefits and alternatives for the proposed anesthesia with the patient or authorized representative who has indicated his/her understanding and acceptance.     Dental advisory given  Plan Discussed with: CRNA and Surgeon  Anesthesia Plan Comments:         Anesthesia Quick Evaluation

## 2021-04-09 NOTE — Evaluation (Addendum)
Physical Therapy Evaluation Patient Details Name: Brooke Carrillo MRN: [id number] DOB: 06/21/1986 Today's Date: 04/09/2021   History of Present Illness  Pt is a 35 y.o. 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 Significant PMH: tobacco use.  Clinical Impression  PTA, pt lives with her spouse and four children. Pt presents with decreased functional mobility secondary to pain and mild balance deficits. Pt ambulating to bathroom and back with no assistive device at a min guard assist level. HR up to 140 bpm. Pt denies visual changes and reporting mild numbness/tingling in both hands. Suspect good progression given appropriate pain control/management. Will continue to follow acutely to promote mobility as tolerated.     Follow Up Recommendations Supervision for mobility/OOB;No PT follow up    Equipment Recommendations  None recommended by PT    Recommendations for Other Services       Precautions / Restrictions Precautions Precautions: Other (comment);Cervical Precaution Booklet Issued: No Precaution Comments: light sensitivity Restrictions Weight Bearing Restrictions: No      Mobility  Bed Mobility Overal bed mobility: Needs Assistance Bed Mobility: Sidelying to Sit;Sit to Supine   Sidelying to sit: Min assist   Sit to supine: Min assist   General bed mobility comments: Pt in sidelying position upon entry, cues for use of railing, light assist at trunk to progress to upright position. MinA for LE negotiation back into bed due to pain    Transfers Overall transfer level: Needs assistance Equipment used: Rolling walker (2 wheeled);None Transfers: Sit to/from Stand Sit to Stand: Min guard         General transfer comment: Pt requesting RW to stand up from bed on first trial, pulling up from grab bar in bathroom from toilet to stand.  Ambulation/Gait Ambulation/Gait assistance: Min guard Gait  Distance (Feet): 20 Feet Assistive device: None Gait Pattern/deviations: Step-through pattern;Decreased stride length Gait velocity: decreased   General Gait Details: slow and steady pace  Information systems manager Rankin (Stroke Patients Only)       Balance Overall balance assessment: Needs assistance Sitting-balance support: Feet supported Sitting balance-Leahy Scale: Normal     Standing balance support: No upper extremity supported;During functional activity Standing balance-Leahy Scale: Good       Tandem Stance - Right Leg: 5   Rhomberg - Eyes Opened: 10                   Pertinent Vitals/Pain Pain Assessment: Faces Faces Pain Scale: Hurts worst Pain Location: head located over left eye Pain Descriptors / Indicators: Constant;Grimacing;Guarding;Headache Pain Intervention(s): Limited activity within patient's tolerance;Monitored during session;Premedicated before session;Ice applied;Patient requesting pain meds-RN notified    Home Living Family/patient expects to be discharged to:: Private residence Living Arrangements: Spouse/significant other;Children Available Help at Discharge: Family;Available PRN/intermittently Type of Home: House Home Access: Stairs to enter   Entrance Stairs-Number of Steps: 3 Home Layout: One level   Additional Comments: 4 children, ranging 2 y.o. - 52 y.o.    Prior Function Level of Independence: Independent         Comments: does not work     Higher education careers adviser        Extremity/Trunk Assessment   Upper Extremity Assessment Upper Extremity Assessment: RUE deficits/detail;LUE deficits/detail RUE Deficits / Details: reporting hand numbness/tingling LUE Deficits / Details: reporting hand numbness/tingling    Lower  Extremity Assessment Lower Extremity Assessment: RLE deficits/detail;LLE deficits/detail RLE Deficits / Details: strength 5/5 LLE Deficits / Details: strength 5/5     Cervical / Trunk Assessment Cervical / Trunk Assessment: Normal  Communication   Communication: No difficulties  Cognition Arousal/Alertness: Awake/alert Behavior During Therapy: WFL for tasks assessed/performed Overall Cognitive Status: Within Functional Limits for tasks assessed                                 General Comments: Internally distracted by pain      General Comments      Exercises     Assessment/Plan    PT Assessment Patient needs continued PT services  PT Problem List Decreased activity tolerance;Decreased balance;Decreased mobility;Pain       PT Treatment Interventions Gait training;Functional mobility training;Stair training;Therapeutic activities;Therapeutic exercise;Balance training;Patient/family education    PT Goals (Current goals can be found in the Care Plan section)  Acute Rehab PT Goals Patient Stated Goal: less pain PT Goal Formulation: With patient Time For Goal Achievement: 04/23/21 Potential to Achieve Goals: Good    Frequency Min 4X/week   Barriers to discharge        Co-evaluation               AM-PAC PT "6 Clicks" Mobility  Outcome Measure Help needed turning from your back to your side while in a flat bed without using bedrails?: None Help needed moving from lying on your back to sitting on the side of a flat bed without using bedrails?: A Little Help needed moving to and from a bed to a chair (including a wheelchair)?: A Little Help needed standing up from a chair using your arms (e.g., wheelchair or bedside chair)?: A Little Help needed to walk in hospital room?: A Little Help needed climbing 3-5 steps with a railing? : A Little 6 Click Score: 19    End of Session   Activity Tolerance: Patient tolerated treatment well Patient left: in bed;with call bell/phone within reach Nurse Communication: Mobility status;Patient requests pain meds PT Visit Diagnosis: Pain;Difficulty in walking, not elsewhere  classified (R26.2) Pain - part of body:  (head/neck)    Time: 5170-0174 PT Time Calculation (min) (ACUTE ONLY): 21 min   Charges:   PT Evaluation $PT Eval Low Complexity: 1 Low          Brooke Carrillo, PT, DPT Acute Rehabilitation Services Pager [phone] Office [phone]   Norval Morton 04/09/2021, 9:00 AM

## 2021-04-09 NOTE — Anesthesia Postprocedure Evaluation (Signed)
Anesthesia Post Note  Patient: Brooke Carrillo  Procedure(s) Performed: Suboccipital craniectomy for chiari decompression     Anesthesia Post Evaluation No notable events documented.  Last Vitals:  Vitals:   04/09/21 0900 04/09/21 1000  BP: 102/71 109/62  Pulse: 68 78  Resp: 18 18  Temp:    SpO2: 95% 97%    Last Pain:  Vitals:   04/09/21 0905  TempSrc:   PainSc: 8                  Tiasia Weberg

## 2021-04-09 NOTE — Progress Notes (Signed)
Neurosurgery Service Progress Note  Subjective: No acute events overnight, neck pain as expected   Objective: Vitals:   04/09/21 1000 04/09/21 1100 04/09/21 1200 04/09/21 1300  BP: 109/62 113/76 123/65 121/67  Pulse: 78 96 68 72  Resp: 18 14 13 19   Temp:   98.9 F (37.2 C)   TempSrc:   Oral   SpO2: 97% 96% 96% 97%  Weight:      Height:        Physical Exam: AOx3, PERRL, EOMI, FS, TM, Strength 5/5 x4, SILTx4  Assessment & Plan: 35 y.o. woman s/p chiari decompression, recovering well.  -transfer to stepdown -ADAT / activity as tolerated -PT/OT rec no follow up -SQH POD2  20  04/09/21 2:19 PM

## 2021-04-09 NOTE — Evaluation (Signed)
Occupational Therapy Evaluation Patient Details Name: Brooke Carrillo MRN: [id number] DOB: [birth year] Today's Date: 04/09/2021    History of Present Illness Pt is a 35 y.o. 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 Significant PMH: tobacco use.   Clinical Impression   Pt PTA: pt living at home with 4 children and spouse, reports independence with ADL and mobility. Pt currently, limited by vision specifically allowing light in with eyes open. Pt given sunglasses that assisted with brightness, but not enough to reduce pain. Pt reports pain 10/10. Pt performing minimal activity due to pain; mobility in room with no AD, transferring to commode with minguardA to guide hands d/t sky light in bathroom is very bright and pt keeping eyes closed. Pt physically able to perform ADL tasks, but requires an additional OT session to progress to OOB ADL with eyes open. Pt would benefit from continued OT skilled services. OT following acutely.    Follow Up Recommendations  No OT follow up    Equipment Recommendations  None recommended by OT    Recommendations for Other Services       Precautions / Restrictions Precautions Precautions: Other (comment);Cervical Precaution Booklet Issued: No Precaution Comments: light sensitivity Restrictions Weight Bearing Restrictions: No      Mobility Bed Mobility Overal bed mobility: Needs Assistance Bed Mobility: Sidelying to Sit;Sit to Supine   Sidelying to sit: Supervision   Sit to supine: Supervision   General bed mobility comments: Pt requiring no physical assist and use of bed rail    Transfers Overall transfer level: Needs assistance Equipment used: None Transfers: Sit to/from Stand Sit to Stand: Min guard         General transfer comment: no AD with mobility    Balance Overall balance assessment: Needs assistance Sitting-balance support: Feet  supported Sitting balance-Leahy Scale: Normal     Standing balance support: No upper extremity supported;During functional activity Standing balance-Leahy Scale: Good                             ADL either performed or assessed with clinical judgement   ADL Overall ADL's : Needs assistance/impaired Eating/Feeding: Set up;Sitting   Grooming: Min guard;Standing;Sitting   Upper Body Bathing: Set up;Standing   Lower Body Bathing: Min guard;Sitting/lateral leans;Sit to/from stand;Cueing for safety   Upper Body Dressing : Set up;Sitting   Lower Body Dressing: Minimal assistance;Min guard   Toilet Transfer: Min guard;Cueing for safety Toilet Transfer Details (indicate cue type and reason): cues for safety as pt keeping eyes closed (even with sungalsses on)         Functional mobility during ADLs: Min guard;Cueing for safety General ADL Comments: Pt limited by vision and allowing light in with eyes open. Pt given sunglasses that assisted with brightness, but not enough to reduce pain. Pt reports pain 10/10. Pt performing minimal activity due to pain; mobility in room with no AD, transferring to commode with minguardA to guide hands d/t sky light in bathroom is very bright and pt keeping eyes closed.     Vision Patient Visual Report: Other (comment) (visual deficits; brightness impairing ability to care for self.) Vision Assessment?: Vision impaired- to be further tested in functional context Additional Comments: Pt unable to open eyes for assessment.     Perception     Praxis      Pertinent Vitals/Pain Pain Assessment: Faces Faces  Pain Scale: Hurts worst Pain Location: head located over left eye Pain Descriptors / Indicators: Constant;Grimacing;Guarding;Headache Pain Intervention(s): Limited activity within patient's tolerance;Monitored during session;Repositioned     Hand Dominance Right   Extremity/Trunk Assessment Upper Extremity Assessment Upper Extremity  Assessment: RUE deficits/detail RUE Deficits / Details: reporting hand numbness/tingling LUE Deficits / Details: reporting hand numbness/tingling   Lower Extremity Assessment Lower Extremity Assessment: Defer to PT evaluation;Overall [facility] for tasks assessed   Cervical / Trunk Assessment Cervical / Trunk Assessment: Normal   Communication Communication Communication: No difficulties   Cognition Arousal/Alertness: Awake/alert Behavior During Therapy: WFL for tasks assessed/performed Overall Cognitive Status: Within Functional Limits for tasks assessed                                 General Comments: Internally distracted by pain   General Comments  HR up to 122 BPM with exertion. VSS on RA.    Exercises     Shoulder Instructions      Home Living Family/patient expects to be discharged to:: Private residence Living Arrangements: Spouse/significant other;Children Available Help at Discharge: Family;Available PRN/intermittently Type of Home: House Home Access: Stairs to enter [employer] of Steps: 3   Home Layout: One level     Bathroom Shower/Tub: Walk-in shower             Additional Comments: 4 children, ranging 2 y.o. - 52 y.o.      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; stay at home mom        OT Problem List: Decreased activity tolerance;Impaired balance (sitting and/or standing);Decreased safety awareness;Pain;Impaired UE functional use;Increased edema;Impaired vision/perception      OT Treatment/Interventions: Self-care/ADL training;Therapeutic exercise;Therapeutic activities;Visual/perceptual remediation/compensation;Patient/family education;Balance training;Cognitive remediation/compensation    OT Goals(Current goals can be found in the care plan section) Acute Rehab OT Goals Patient Stated Goal: less pain OT Goal Formulation: With patient Time For Goal  Achievement: 04/23/21 Potential to Achieve Goals: Good ADL Goals Pt Will Perform Grooming: with modified independence;standing Pt Will Perform Lower Body Dressing: with modified independence Additional ADL Goal #1: Pt will visually attend to all planes with compensatory strategies of i.e. turning head, wearing sunglasses to improve independence with OOB ADL.  OT Frequency: Min 2X/week   Barriers to D/C:            Co-evaluation              AM-PAC OT "6 Clicks" Daily Activity     Outcome Measure Help from another person eating meals?: A Little Help from another person taking care of personal grooming?: A Little Help from another person toileting, which includes using toliet, bedpan, or urinal?: A Little Help from another person bathing (including washing, rinsing, drying)?: A Little Help from another person to put on and taking off regular upper body clothing?: A Little Help from another person to put on and taking off regular lower body clothing?: A Little 6 Click Score: 18   End of Session Nurse Communication: Mobility status  Activity Tolerance: Patient tolerated treatment well Patient left: in bed;with call bell/phone within reach;with family/visitor present;Other (comment) (discussed with RN. No need for bed alarm. Pt aware of use of call bell)  OT Visit Diagnosis: Unsteadiness on feet (R26.81);Muscle weakness (generalized) (M62.81)                Time: 0981-1914 OT Time Calculation (  min): 14 min Charges:  OT General Charges $OT Visit: 1 Visit OT Evaluation $OT Eval Low Complexity: 1 Low Flora Lipps, OTR/L Acute Rehabilitation Services Pager: [phone] Office: [phone]   Janifer Gieselman C 04/09/2021, 1:42 PM

## 2021-04-10 ENCOUNTER — Encounter (HOSPITAL_COMMUNITY): Payer: Self-pay | Admitting: Neurological Surgery

## 2021-04-10 MED ORDER — CYCLOBENZAPRINE HCL 10 MG PO TABS: 10.0000 mg | ORAL_TABLET | Freq: Three times a day (TID) | ORAL | 0 refills | Status: DC | PRN

## 2021-04-10 MED ORDER — OXYCODONE HCL 5 MG PO TABS: 5.0000 mg | ORAL_TABLET | ORAL | 0 refills | Status: DC | PRN

## 2021-04-10 NOTE — Progress Notes (Signed)
Pt discharged from facility. 2 PIVs removed, all room belongings packed, pt educated on discharge information with no additional questions. Pt escorted to private vehicle in wheelchair.   Robina Ade, RN

## 2021-04-10 NOTE — Progress Notes (Signed)
Neurosurgery Service Progress Note  Subjective: No acute events overnight, neck pain as expected but improved from yesterday a fair bit  Objective: Vitals:   04/09/21 1907 04/09/21 2330 04/10/21 0325 04/10/21 0804  BP: (!) 149/80 (!) 113/54 113/61 104/77  Pulse: 67 77 84 84  Resp: 15 16 17 18   Temp: 98.9 F (37.2 C) 99.9 F (37.7 C) 99.3 F (37.4 C) 99.2 F (37.3 C)  TempSrc: Oral Axillary Oral Oral  SpO2: 98% 96% 98% 95%  Weight:      Height:        Physical Exam: AOx3, PERRL, EOMI, FS, TM, Strength 5/5 x4, SILTx4  Assessment & Plan: 35 y.o. woman s/p chiari decompression, recovering well.  -discharge home today  20  04/10/21 10:46 AM

## 2021-04-10 NOTE — Progress Notes (Signed)
Per nightshift report, pt had needed to be in and out cath'd overnight. This am, she walked to the bathroom with a walker and voided without difficulty.   Robina Ade, RN

## 2021-04-10 NOTE — Progress Notes (Signed)
Physical Therapy Treatment Patient Details Name: Brooke Carrillo MRN: [id number] DOB: 1986-03-21 Today's Date: 04/10/2021    History of Present Illness Pt is a 35 y.o. 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 Significant PMH: tobacco use.    PT Comments    Pt with improved pain control and light sensitivity. Utilized sunglasses and ambulated x 200 feet in the hallway without physical difficulty. Demonstrates no apparent dynamic balance deficits or ataxia. Reviewed precautions. Pt with no further questions/concerns. Thank you for this consult.     Follow Up Recommendations  Supervision for mobility/OOB;No PT follow up     Equipment Recommendations  None recommended by PT    Recommendations for Other Services       Precautions / Restrictions Precautions Precautions: Other (comment);Cervical Precaution Booklet Issued: No Precaution Comments: light sensitivity Restrictions Weight Bearing Restrictions: No    Mobility  Bed Mobility               General bed mobility comments: OOB in chair    Transfers Overall transfer level: Independent Equipment used: None                Ambulation/Gait Ambulation/Gait assistance: Modified independent (Device/Increase time) Gait Distance (Feet): 200 Feet Assistive device: None Gait Pattern/deviations: Step-through pattern;Decreased stride length Gait velocity: decreased   General Gait Details: slow and steady Teacher, music Rankin (Stroke Patients Only)       Balance Overall balance assessment: Needs assistance Sitting-balance support: Feet supported Sitting balance-Leahy Scale: Normal     Standing balance support: No upper extremity supported;During functional activity Standing balance-Leahy Scale: Good                              Cognition Arousal/Alertness:  Awake/alert Behavior During Therapy: WFL for tasks assessed/performed Overall Cognitive Status: Within Functional Limits for tasks assessed                                        Exercises      General Comments        Pertinent Vitals/Pain Pain Assessment: Faces Faces Pain Scale: Hurts whole lot Pain Location: headache Pain Descriptors / Indicators: Constant;Grimacing;Guarding;Headache Pain Intervention(s): Limited activity within patient's tolerance;Monitored during session    Home Living                      Prior Function            PT Goals (current goals can now be found in the care plan section) Acute Rehab PT Goals Patient Stated Goal: less pain PT Goal Formulation: With patient Time For Goal Achievement: 04/23/21 Potential to Achieve Goals: Good Progress towards PT goals: Goals met/education completed, patient discharged from PT    Frequency    Min 4X/week      PT Plan Current plan remains appropriate    Co-evaluation              AM-PAC PT "6 Clicks" Mobility   Outcome Measure  Help needed turning from your back to your side while in a flat bed without using bedrails?: None Help needed moving from lying on your back  to sitting on the side of a flat bed without using bedrails?: None Help needed moving to and from a bed to a chair (including a wheelchair)?: None Help needed standing up from a chair using your arms (e.g., wheelchair or bedside chair)?: None Help needed to walk in hospital room?: None Help needed climbing 3-5 steps with a railing? : A Little 6 Click Score: 23    End of Session   Activity Tolerance: Patient tolerated treatment well Patient left: with call bell/phone within reach;in chair Nurse Communication: Mobility status;Patient requests pain meds PT Visit Diagnosis: Pain;Difficulty in walking, not elsewhere classified (R26.2) Pain - part of body:  (head/neck)     Time: 1215-1223 PT Time  Calculation (min) (ACUTE ONLY): 8 min  Charges:  $Therapeutic Activity: 8-22 mins                     Wyona Almas, PT, DPT Acute Rehabilitation Services Pager [phone] Office [phone]    Deno Etienne 04/10/2021, 3:28 PM

## 2021-04-11 ENCOUNTER — Telehealth: Payer: Self-pay

## 2021-04-11 NOTE — Telephone Encounter (Signed)
Transition Care Management Follow-up Telephone Call Date of discharge and from where: 04/10/2021, [facility] How have you been since you were released from the hospital? She said she is feeling pretty good considering the type of surgery she had.  Her headache pain is tolerable and she is trying not to take the oxycodone if she doesn't need it.  Any questions or concerns? Yes - she had question regarding showering.  Reviewed the instructions on page 1 of AVS.   Items Reviewed: Did the pt receive and understand the discharge instructions provided? Yes  Medications obtained and verified? Yes - she has both medications. Other? No  Any new allergies since your discharge? No  Dietary orders reviewed? No Do you have support at home? Yes   Home Care and Equipment/Supplies: Were home health services ordered? no If so, what is the name of the agency? N/a  Has the agency set up a time to come to the patient's home? not applicable Were any new equipment or medical supplies ordered?  No What is the name of the medical supply agency? N/a Were you able to get the supplies/equipment? not applicable Do you have any questions related to the use of the equipment or supplies? No  Functional Questionnaire: (I = Independent and D = Dependent) ADLs: independent  Follow up appointments reviewed:  PCP Hospital f/u appt confirmed?  She said she will call to schedule an appointment    Specialist Hospital f/u appt confirmed?  She needs to call the neurosurgeon to make an appointment. She has appointment with cardiology- 04/18/2021.    Are transportation arrangements needed? No  If their condition worsens, is the pt aware to call PCP or go to the Emergency Dept.? Yes Was the patient provided with contact information for the PCP's office or ED? Yes Was to pt encouraged to call back with questions or concerns? Yes

## 2021-04-14 ENCOUNTER — Emergency Department (HOSPITAL_COMMUNITY)
Admission: EM | Admit: 2021-04-14 | Discharge: 2021-04-15 | Disposition: A | Discharge status: Home / Self Care | Payer: Medicaid Other | Attending: Emergency Medicine | Admitting: Emergency Medicine

## 2021-04-14 ENCOUNTER — Encounter (HOSPITAL_COMMUNITY): Payer: Self-pay | Admitting: Emergency Medicine

## 2021-04-14 ENCOUNTER — Other Ambulatory Visit: Payer: Self-pay

## 2021-04-14 DIAGNOSIS — H53149 Visual discomfort, unspecified: Secondary | ICD-10-CM | POA: Insufficient documentation

## 2021-04-14 DIAGNOSIS — R519 Headache, unspecified: Secondary | ICD-10-CM | POA: Insufficient documentation

## 2021-04-14 DIAGNOSIS — F17210 Nicotine dependence, cigarettes, uncomplicated: Secondary | ICD-10-CM | POA: Insufficient documentation

## 2021-04-14 LAB — BASIC METABOLIC PANEL
Anion gap: 4 — ABNORMAL LOW (ref 5–15)
BUN: 15 mg/dL (ref 6–20)
CO2: 31 mmol/L (ref 22–32)
Calcium: 10.2 mg/dL (ref 8.9–10.3)
Chloride: 103 mmol/L (ref 98–111)
Creatinine, Ser: 0.79 mg/dL (ref 0.44–1.00)
GFR, Estimated: >60 mL/min (ref >60)
Glucose, Bld: 120 mg/dL — ABNORMAL HIGH (ref 70–99)
Potassium: 3.6 mmol/L (ref 3.5–5.1)
Sodium: 138 mmol/L (ref 135–145)

## 2021-04-14 LAB — CBC WITH DIFFERENTIAL/PLATELET
Abs Immature Granulocytes: 0.04 10*3/uL (ref 0.00–0.07)
Basophils Absolute: 0.1 10*3/uL (ref 0.0–0.1)
Basophils Relative: 1 %
Eosinophils Absolute: 0.2 10*3/uL (ref 0.0–0.5)
Eosinophils Relative: 2 %
HCT: 38.5 % (ref 36.0–46.0)
Hemoglobin: 12.9 g/dL (ref 12.0–15.0)
Immature Granulocytes: 0 %
Lymphocytes Relative: 26 %
Lymphs Abs: 3.4 10*3/uL (ref 0.7–4.0)
MCH: 32.5 pg (ref 26.0–34.0)
MCHC: 33.5 g/dL (ref 30.0–36.0)
MCV: 97 fL (ref 80.0–100.0)
Monocytes Absolute: 0.6 10*3/uL (ref 0.1–1.0)
Monocytes Relative: 4 %
Neutro Abs: 8.9 10*3/uL — ABNORMAL HIGH (ref 1.7–7.7)
Neutrophils Relative %: 67 %
Platelets: 369 10*3/uL (ref 150–400)
RBC: 3.97 MIL/uL (ref 3.87–5.11)
RDW: 12.2 % (ref 11.5–15.5)
WBC: 13.2 10*3/uL — ABNORMAL HIGH (ref 4.0–10.5)
nRBC: 0 % (ref 0.0–0.2)

## 2021-04-14 LAB — I-STAT BETA HCG BLOOD, ED (MC, WL, AP ONLY): I-stat hCG, quantitative: <5 m[IU]/mL (ref <5)

## 2021-04-14 MED ORDER — OXYCODONE-ACETAMINOPHEN 5-325 MG PO TABS
1.0000 | ORAL_TABLET | Freq: Once | ORAL | Status: AC
  Administered 2021-04-14: 1 via ORAL
  Filled 2021-04-14: qty 1

## 2021-04-14 NOTE — ED Triage Notes (Signed)
Patient reports headache with nausea and photophobia onset last Friday ,denies head injury , no fever or chills . Craniectomy for Chiari malformation last 04/08/21.

## 2021-04-15 ENCOUNTER — Emergency Department (HOSPITAL_COMMUNITY): Payer: Medicaid Other

## 2021-04-15 MED ORDER — PROCHLORPERAZINE MALEATE 10 MG PO TABS: 10.0000 mg | ORAL_TABLET | Freq: Two times a day (BID) | ORAL | 0 refills | Status: DC | PRN

## 2021-04-15 MED ORDER — DEXAMETHASONE SODIUM PHOSPHATE 10 MG/ML IJ SOLN
10.0000 mg | Freq: Once | INTRAMUSCULAR | Status: AC
  Administered 2021-04-15: 10 mg via INTRAVENOUS
  Filled 2021-04-15: qty 1

## 2021-04-15 MED ORDER — DIPHENHYDRAMINE HCL 50 MG/ML IJ SOLN
25.0000 mg | Freq: Once | INTRAMUSCULAR | Status: AC
  Administered 2021-04-15: 25 mg via INTRAVENOUS
  Filled 2021-04-15: qty 1

## 2021-04-15 MED ORDER — SODIUM CHLORIDE 0.9 % IV BOLUS
1000.0000 mL | Freq: Once | INTRAVENOUS | Status: AC
  Administered 2021-04-15: 1000 mL via INTRAVENOUS

## 2021-04-15 MED ORDER — PROCHLORPERAZINE EDISYLATE 10 MG/2ML IJ SOLN
10.0000 mg | Freq: Once | INTRAMUSCULAR | Status: AC
  Administered 2021-04-15: 10 mg via INTRAVENOUS
  Filled 2021-04-15: qty 2

## 2021-04-15 NOTE — Discharge Instructions (Addendum)
Your work up tonight was reassuring. It does seem that your headache is related to an infection such as meningitis or post operative infection. Your CT head showed changes related to your surgery, but no bleeding in or around the brain. Your headache improved with a migraine cocktail. I have prescribed you the same medication we used to help treat your headache. Please reach out to the Neurosurgeon to inform them of your headache and your visit tonight.

## 2021-04-15 NOTE — ED Provider Notes (Addendum)
[facility] EMERGENCY DEPARTMENT Provider Note  CSN: [id number] Arrival date & time: 04/14/21 2231  Chief Complaint(s) Headache  HPI Brooke Carrillo is a 35 y.o. female with a past medical history listed below including recent craniectomy for Chiari I malformation performed a week ago who presents to the emergency department with 3 days of gradually worsening headache.  This headache is different from her typical headache from Chiari malformation.  She is endorsing nuchal aching but the worst pain is calvarial described as a aching/throbbing sensation.  Pain is severe and fluctuating nature.  She endorses mild photophobia.  She also endorses one episode of nausea and nonbloody nonbilious emesis after smoking a cigarette while in the waiting room.  No alleviating factors.  Patient denies any recent fevers.  No coughing or congestion.  No chest pain or shortness of breath.  No abdominal pain.  No focal deficits.  No visual changes.  Reports that her symptoms from her Chiari malformation including her gait instability have resolved following the surgery.  Headache  Past Medical History Past Medical History:  Diagnosis Date   Headache    Pregnancy as incidental finding    Patient Active Problem List   Diagnosis Date Noted   Chiari malformation type I (HCC) 04/08/2021   Chest pain of uncertain etiology 11/08/2020   Palpitations 11/08/2020   Acute cholecystitis 08/27/2020   Sinus bradycardia 08/27/2020   Postpartum care and examination 06/01/2018   Unwanted fertility 06/01/2018   Contraception management 06/01/2018   Vitamin D deficiency 10/21/2017   Tobacco abuse 10/13/2017   Home Medication(s) Prior to Admission medications   Medication Sig Start Date End Date Taking? Authorizing Provider  prochlorperazine (COMPAZINE) 10 MG tablet Take 1 tablet (10 mg total) by mouth 2 (two) times daily as needed for nausea or vomiting (headache). 04/15/21  Yes Tyronica Truxillo, Amadeo Garnet,  MD  cyclobenzaprine (FLEXERIL) 10 MG tablet Take 1 tablet (10 mg total) by mouth 3 (three) times daily as needed for muscle spasms. 04/10/21   Jadene Pierini, MD  oxyCODONE (OXY IR/ROXICODONE) 5 MG immediate release tablet Take 1 tablet (5 mg total) by mouth every 4 (four) hours as needed for moderate pain ((score 4 to 6)). 04/10/21   Jadene Pierini, MD                                                                                                                                    Past Surgical History Past Surgical History:  Procedure Laterality Date   CHOLECYSTECTOMY N/A 08/27/2020   Procedure: LAPAROSCOPIC CHOLECYSTECTOMY;  Surgeon: [name], MD;  Location: [facility];  Service: General;  Laterality: N/A;   LAPAROSCOPIC TUBAL LIGATION Bilateral 07/23/2018   Procedure: LAPAROSCOPIC TUBAL LIGATION;  Surgeon: Hermina Staggers, MD;  Location: [facility] ORS;  Service: Gynecology;  Laterality: Bilateral;   ORIF FINGER / THUMB FRACTURE     SUBOCCIPITAL CRANIECTOMY CERVICAL LAMINECTOMY N/A 04/08/2021  Procedure: Suboccipital craniectomy for chiari decompression;  Surgeon: Jadene Pierini, MD;  Location: [facility] OR;  Service: Neurosurgery;  Laterality: N/A;   WISDOM TOOTH EXTRACTION     Family History Family History  Problem Relation Age of Onset   Hypertension Father    Stroke Father    Heart disease Father    Dementia Mother    Cancer Maternal Aunt        Breast   HIV Maternal Aunt    Stroke Maternal Uncle    Cancer Paternal Uncle        Colon   Cancer Maternal Grandmother    Hypertension Paternal Grandmother    Cancer Paternal Grandfather        Colon    Social History Social History   Tobacco Use   Smoking status: Every Day    Packs/day: 0.50    Types: Cigarettes   Smokeless tobacco: Never  Vaping Use   Vaping Use: Former  Substance Use Topics   Alcohol use: No   Drug use: Yes    Types: Heroin    Comment: "started a week ago"   Allergies Patient has no known  allergies.  Review of Systems Review of Systems  Neurological:  Positive for headaches.  All other systems are reviewed and are negative for acute change except as noted in the HPI  Physical Exam Vital Signs  I have reviewed the triage vital signs BP 137/76 (BP Location: Left Arm)   Pulse 68   Temp 98.3 F (36.8 C) (Oral)   Resp 17   Ht 5\' 3"  (1.6 m)   Wt 82 kg   LMP 03/31/2021   SpO2 98%   BMI 32.02 kg/m   Physical Exam Vitals reviewed.  Constitutional:      General: She is not in acute distress.    Appearance: She is well-developed. She is not diaphoretic.  HENT:     Head: Normocephalic and atraumatic.      Right Ear: External ear normal.     Left Ear: External ear normal.     Nose: Nose normal.  Eyes:     General: No scleral icterus.    Conjunctiva/sclera: Conjunctivae normal.  Neck:     Trachea: Phonation normal.   Cardiovascular:     Rate and Rhythm: Normal rate and regular rhythm.  Pulmonary:     Effort: Pulmonary effort is normal. No respiratory distress.     Breath sounds: No stridor.  Abdominal:     General: There is no distension.  Musculoskeletal:        General: Normal range of motion.     Cervical back: Normal range of motion. No rigidity. Pain with movement (pain to trapezius musculature) present. No spinous process tenderness.  Neurological:     Mental Status: She is alert and oriented to person, place, and time.     Comments: Mental Status:  Alert and oriented to person, place, and time.  Attention and concentration normal.  Speech clear.  Recent memory is intact  Cranial Nerves:  II Visual Fields: Intact to confrontation. Visual fields intact. III, IV, VI: Pupils equal and reactive to light and near. Full eye movement without nystagmus  V Facial Sensation: Normal. No weakness of masticatory muscles  VII: No facial weakness or asymmetry  VIII Auditory Acuity: Grossly normal  IX/X: The uvula is midline; the palate elevates symmetrically   XI: Normal sternocleidomastoid and trapezius strength  XII: The tongue is midline. No atrophy or fasciculations.   Motor  System: Muscle Strength: 5/5 and symmetric in the upper and lower extremities. No pronation or drift.  Muscle Tone: Tone and muscle bulk are normal in the upper and lower extremities.  Coordination: No tremor.  Sensation: Intact to light touch  Gait: deferred   Psychiatric:        Behavior: Behavior normal.    ED Results and Treatments Labs (all labs ordered are listed, but only abnormal results are displayed) Labs Reviewed  CBC WITH DIFFERENTIAL/PLATELET - Abnormal; Notable for the following components:      Result Value   WBC 13.2 (*)    Neutro Abs 8.9 (*)    All other components within normal limits  BASIC METABOLIC PANEL - Abnormal; Notable for the following components:   Glucose, Bld 120 (*)    Anion gap 4 (*)    All other components within normal limits  I-STAT BETA HCG BLOOD, ED ([facility], [facility], [facility] ONLY)                                                                                                                         EKG  EKG Interpretation  Date/Time:    Ventricular Rate:    PR Interval:    QRS Duration:   QT Interval:    QTC Calculation:   R Axis:     Text Interpretation:         Radiology CT Head Wo Contrast  Result Date: 04/15/2021 CLINICAL DATA:  Headache, nausea, photophobia. Craniectomy for Chiari 1 malformation on July 11th. EXAM: CT HEAD WITHOUT CONTRAST TECHNIQUE: Contiguous axial images were obtained from the base of the skull through the vertex without intravenous contrast. COMPARISON:  None. FINDINGS: Brain: No evidence of acute infarction, hemorrhage, hydrocephalus, extra-axial collection or mass lesion/mass effect. Vascular: No hyperdense vessel or unexpected calcification. Skull: Status post suboccipital craniectomy with associated fluid/postsurgical changes. Otherwise normal. Negative for fracture or focal lesion. Sinuses/Orbits:  The visualized paranasal sinuses are essentially clear. The mastoid air cells are unopacified. Other: None. IMPRESSION: Postsurgical changes related to recent subsegmental craniectomy. No evidence of acute intracranial abnormality. Electronically Signed   By: Charline BillsSriyesh  Krishnan M.D.   On: 04/15/2021 02:47    Pertinent labs & imaging results that were available during my care of the patient were reviewed by me and considered in my medical decision making (see chart for details).  Medications Ordered in ED Medications  oxyCODONE-acetaminophen (PERCOCET/ROXICET) 5-325 MG per tablet 1 tablet (1 tablet Oral Given 04/14/21 2255)  sodium chloride 0.9 % bolus 1,000 mL (0 mLs Intravenous Stopped 04/15/21 0345)  diphenhydrAMINE (BENADRYL) injection 25 mg (25 mg Intravenous Given 04/15/21 0145)  dexamethasone (DECADRON) injection 10 mg (10 mg Intravenous Given 04/15/21 0146)  prochlorperazine (COMPAZINE) injection 10 mg (10 mg Intravenous Given 04/15/21 0146)  Procedures Procedures  (including critical care time)  Medical Decision Making / ED Course I have reviewed the nursing notes for this encounter and the patient's prior records (if available in EHR or on provided paperwork).   Brooke Carrillo was evaluated in Emergency Department on 04/15/2021 for the symptoms described in the history of present illness. She was evaluated in the context of the global COVID-19 pandemic, which necessitated consideration that the patient might be at risk for infection with the SARS-CoV-2 virus that causes COVID-19. Institutional protocols and algorithms that pertain to the evaluation of patients at risk for COVID-19 are in a state of rapid change based on information released by regulatory bodies including the CDC and federal and state organizations. These policies and algorithms were followed during  the patient's care in the ED.  Headache s/p craniectomy for Chiari I. Non focal neuro exam. No fever. Doubt meningitis or post operative infection.  CT head with post surgical changes. No intracranial bleed.    Will treat with migraine cocktail and reevaluate.  Completely resolved.     Final Clinical Impression(s) / ED Diagnoses Final diagnoses:  Bad headache   The patient appears reasonably screened and/or stabilized for discharge and I doubt any other medical condition or other [facility] requiring further screening, evaluation, or treatment in the ED at this time prior to discharge. Safe for discharge with strict return precautions.  Disposition: Discharge  Condition: Good  I have discussed the results, Dx and Tx plan with the patient/family who expressed understanding and agree(s) with the plan. Discharge instructions discussed at length. The patient/family was given strict return precautions who verbalized understanding of the instructions. No further questions at time of discharge.    ED Discharge Orders          Ordered    prochlorperazine (COMPAZINE) 10 MG tablet  2 times daily PRN        04/15/21 0419              Follow Up: Jadene Pierini, MD [address] [place] [address] [phone]  Call       This chart was dictated using voice recognition software.  Despite best efforts to proofread,  errors can occur which can change the documentation meaning.      Nira Conn, MD 04/15/21 [phone]

## 2021-04-16 NOTE — Discharge Summary (Signed)
Discharge Summary  Date of Admission: 04/08/2021  Date of Discharge: 04/10/2021  Attending Physician: Autumn Patty, MD  Hospital Course: Patient was admitted following an uncomplicated chiari decompression. She was recovered in PACU and transferred to 4NP. Her hospital course was uncomplicated and the patient was discharged home on 04/10/21. She will follow up in clinic with me in 2 weeks.  Neurologic exam at discharge:  AOx3, PERRL, EOMI, FS, TM Strength 5/5 x4, SILTx4  Discharge diagnosis: Chiari malformation  Jadene Pierini, MD 04/16/21 2:43 PM

## 2021-04-17 NOTE — Progress Notes (Deleted)
Cardiology Office Note:    Date:  04/17/2021   ID:  Brooke Carrillo, DOB 1986/06/13, MRN [id number]  PCP:  Grayce Sessions, NP  [facility] Cardiologist:  Christell Constant, MD  [facility] Electrophysiologist:  None   CC: Chest pain and palpitations f/u  History of Present Illness:    Brooke Carrillo is a 35 y.o. female with a hx of early menarche (age 15) and tobacco abuse, hx of Chiari malformation, who presented for evaluation 11/08/20. In interim of this visit, patient had heart monitor.  Seen 04/18/21.  Patient notes that she is doing ***.  Since day prior/last visit notes *** changes.  Relevant interval testing or therapy include ***.  There are no*** interval hospital/ED visit.    No chest pain or pressure ***.  No SOB/DOE*** and no PND/Orthopnea***.  No weight gain or leg swelling***.  No palpitations or syncope ***.  Ambulatory blood pressure ***.   Past Medical History:  Diagnosis Date   Headache    Pregnancy as incidental finding     Past Surgical History:  Procedure Laterality Date   CHOLECYSTECTOMY N/A 08/27/2020   Procedure: LAPAROSCOPIC CHOLECYSTECTOMY;  Surgeon: [name], MD;  Location: [facility] OR;  Service: General;  Laterality: N/A;   LAPAROSCOPIC TUBAL LIGATION Bilateral 07/23/2018   Procedure: LAPAROSCOPIC TUBAL LIGATION;  Surgeon: Hermina Staggers, MD;  Location: [facility] ORS;  Service: Gynecology;  Laterality: Bilateral;   ORIF FINGER / THUMB FRACTURE     SUBOCCIPITAL CRANIECTOMY CERVICAL LAMINECTOMY N/A 04/08/2021   Procedure: Suboccipital craniectomy for chiari decompression;  Surgeon: Jadene Pierini, MD;  Location: [facility] OR;  Service: Neurosurgery;  Laterality: N/A;   WISDOM TOOTH EXTRACTION      Current Medications: No outpatient medications have been marked as taking for the 04/18/21 encounter (Appointment) with Christell Constant, MD.     Allergies:   Patient has no known allergies.   Social History   Socioeconomic History    Marital status: Married    Spouse name: Blessin Kanno   Number of children: 4   Years of education: Not on file   Highest education level: Not on file  Occupational History   Not on file  Tobacco Use   Smoking status: Every Day    Packs/day: 0.50    Types: Cigarettes   Smokeless tobacco: Never  Vaping Use   Vaping Use: Former  Substance and Sexual Activity   Alcohol use: No   Drug use: Yes    Types: Heroin    Comment: "started a week ago"   Sexual activity: Not Currently    Birth control/protection: None  Other Topics Concern   Not on file  Social History Narrative   Right handed    Social Determinants of Health   Financial Resource Strain: Not on file  Food Insecurity: Not on file  Transportation Needs: Not on file  Physical Activity: Not on file  Stress: Not on file  Social Connections: Not on file     Family History: The patient's family history includes Cancer in her maternal aunt, maternal grandmother, paternal grandfather, and paternal uncle; Dementia in her mother; HIV in her maternal aunt; Heart disease in her father; Hypertension in her father and paternal grandmother; Stroke in her father and maternal uncle. History of coronary artery disease notable for no members. History of heart failure notable for father. No history of cardiomyopathies including hypertrophic cardiomyopathy, left ventricular non-compaction, or arrhythmogenic right ventricular cardiomyopathy. History of arrhythmia notable for sister (  SVT and PVCs). Denies family history of sudden cardiac death including drowning, car accidents, or unexplained deaths in the family. No history of bicuspid aortic valve or aortic aneurysm or dissection.  ROS:   Please see the history of present illness.     All other systems reviewed and are negative.  EKGs/Labs/Other Studies Reviewed:    The following studies were reviewed today:  EKG:   11/05/20: Sinus Tachycardia Rate 101 WNL  Cardiac Event  Monitoring: Date: 12/05/20 Results:  Patient had a minimum heart rate of 42 bpm, maximum heart rate of 159 bpm, and average heart rate of 71 bpm. Predominant underlying rhythm was sinus rhythm. Isolated PACs were rare (<1.0%). Isolated PVCs were rare (<1.0%), with rare triplets present. No evidence of complete heart block. Inverted placement of device suspected. Triggered and diary events associated with sinus rhythm and sinus tachycardia.   No malignant arrhythmias.  Recent Labs: 08/27/2020: TSH 0.483 12/08/2020: ALT 9; Magnesium 1.9 04/14/2021: BUN 15; Creatinine, Ser 0.79; Hemoglobin 12.9; Platelets 369; Potassium 3.6; Sodium 138  Recent Lipid Panel No results found for: CHOL, TRIG, HDL, CHOLHDL, VLDL, LDLCALC, LDLDIRECT   Risk Assessment/Calculations:     N/A  Physical Exam:    VS:  LMP 03/31/2021     Wt Readings from Last 3 Encounters:  04/14/21 82 kg  04/08/21 77.6 kg  04/04/21 77.7 kg     GEN:  Well nourished, well developed in no acute distress HEENT: Normal NECK: No JVD; No carotid bruits LYMPHATICS: No lymphadenopathy CARDIAC: RRR, no murmurs, rubs, gallops RESPIRATORY:  Clear to auscultation without rales, wheezing or rhonchi  ABDOMEN: Soft, non-tender, non-distended MUSCULOSKELETAL:  No edema; No deformity  SKIN: Warm and dry NEUROLOGIC:  Alert and oriented x 3 PSYCHIATRIC:  Normal affect   ASSESSMENT:    No diagnosis found.  PLAN:    In order of problems listed above:  Chest Pain Syndrome   Tobacco Abuse- will cut her after dinner cigarette - discussed the dangers of tobacco use, both inhaled and oral, which include, but are not limited to cardiovascular disease, increased cancer risk of multiple types of cancer, COPD, peripheral arterial disease, strokes. - counseled on the benefits of smoking cessation. - firmly advised to quit.  - we also reviewed strategies to maximize success, including: Removing cigarettes and smoking materials from  environment  Stress management Substitution of other forms of reinforcement (the one cigarette a day approach) Support of family/friends and group smoking cessation Selecting a quit date Patient provided contact information for [employer] or 1-800-QUIT-NOW Patient provided with [place]'s 8 free smoking cessation classes: [phone] and [place]   *** follow up unless new symptoms or abnormal test results warranting change in plan  Would be reasonable for *** Video Visit Follow up  Would be reasonable for *** APP Follow up    Medication Adjustments/Labs and Tests Ordered: Current medicines are reviewed at length with the patient today.  Concerns regarding medicines are outlined above.  No orders of the defined types were placed in this encounter.  No orders of the defined types were placed in this encounter.   There are no Patient Instructions on file for this visit.   Signed, Christell Constant, MD  04/17/2021 10:38 AM    [facility]

## 2021-04-18 ENCOUNTER — Ambulatory Visit: Payer: Medicaid Other | Admitting: Internal Medicine

## 2021-05-01 ENCOUNTER — Other Ambulatory Visit: Payer: Self-pay

## 2021-05-01 ENCOUNTER — Emergency Department (HOSPITAL_COMMUNITY): Payer: Medicaid Other

## 2021-05-01 ENCOUNTER — Emergency Department (HOSPITAL_COMMUNITY)
Admission: EM | Admit: 2021-05-01 | Discharge: 2021-05-02 | Disposition: A | Discharge status: Home / Self Care | Payer: Medicaid Other | Attending: Emergency Medicine | Admitting: Emergency Medicine

## 2021-05-01 ENCOUNTER — Encounter (HOSPITAL_COMMUNITY): Payer: Self-pay | Admitting: Emergency Medicine

## 2021-05-01 DIAGNOSIS — R6883 Chills (without fever): Secondary | ICD-10-CM | POA: Diagnosis not present

## 2021-05-01 DIAGNOSIS — F17210 Nicotine dependence, cigarettes, uncomplicated: Secondary | ICD-10-CM | POA: Diagnosis not present

## 2021-05-01 DIAGNOSIS — R519 Headache, unspecified: Secondary | ICD-10-CM | POA: Diagnosis not present

## 2021-05-01 DIAGNOSIS — R112 Nausea with vomiting, unspecified: Secondary | ICD-10-CM | POA: Diagnosis not present

## 2021-05-01 DIAGNOSIS — G8918 Other acute postprocedural pain: Secondary | ICD-10-CM | POA: Insufficient documentation

## 2021-05-01 DIAGNOSIS — Z9889 Other specified postprocedural states: Secondary | ICD-10-CM

## 2021-05-01 DIAGNOSIS — G9608 Other cranial cerebrospinal fluid leak: Secondary | ICD-10-CM

## 2021-05-01 LAB — CBC WITH DIFFERENTIAL/PLATELET
Abs Immature Granulocytes: 0.04 10*3/uL (ref 0.00–0.07)
Basophils Absolute: 0.1 10*3/uL (ref 0.0–0.1)
Basophils Relative: 1 %
Eosinophils Absolute: 0.1 10*3/uL (ref 0.0–0.5)
Eosinophils Relative: 1 %
HCT: 44 % (ref 36.0–46.0)
Hemoglobin: 14.5 g/dL (ref 12.0–15.0)
Immature Granulocytes: 0 %
Lymphocytes Relative: 28 %
Lymphs Abs: 3.1 10*3/uL (ref 0.7–4.0)
MCH: 31.8 pg (ref 26.0–34.0)
MCHC: 33 g/dL (ref 30.0–36.0)
MCV: 96.5 fL (ref 80.0–100.0)
Monocytes Absolute: 0.5 10*3/uL (ref 0.1–1.0)
Monocytes Relative: 4 %
Neutro Abs: 7.3 10*3/uL (ref 1.7–7.7)
Neutrophils Relative %: 66 %
Platelets: 380 10*3/uL (ref 150–400)
RBC: 4.56 MIL/uL (ref 3.87–5.11)
RDW: 12 % (ref 11.5–15.5)
WBC: 11.1 10*3/uL — ABNORMAL HIGH (ref 4.0–10.5)
nRBC: 0 % (ref 0.0–0.2)

## 2021-05-01 MED ORDER — OXYCODONE-ACETAMINOPHEN 5-325 MG PO TABS: 1.0000 | ORAL_TABLET | Freq: Once | ORAL | Status: DC

## 2021-05-01 NOTE — ED Provider Notes (Signed)
Emergency Medicine Provider Triage Evaluation Note  Brooke Carrillo , a 35 y.o. female  was evaluated in triage.  Pt complains of HA, n/v.  Patient states she had a Chiari decompression on the 13th of last month with Dr. Johnsie Cancel.  Since then, she has had issues with headache, nausea, vomiting.  She was seen in the ED soon after the surgery, had a normal head CT.  Given a headache cocktail and symptoms completely resolved.  She states since then, symptoms have returned and they are worse.  She did follow-up with Dr. Johnsie Cancel last week, was given medication without improvement of symptoms.  Review of Systems  Positive: Ha, n/v Negative: fever  Physical Exam  BP 105/71 (BP Location: Left Arm)   Pulse 91   Temp 98.4 F (36.9 C) (Oral)   Resp 18   Ht 5\' 3"  (1.6 m)   Wt 82 kg   SpO2 98%   BMI 32.02 kg/m  Gen:   Awake, no distress   Resp:  Normal effort  MSK:   Moves extremities without difficulty  Other:  Tenderness around the surgical site.  No erythema or drainage.  No obvious neurologic deficits.  Medical Decision Making  Medically screening exam initiated at 9:49 PM.  Appropriate orders placed.  Brooke Carrillo was informed that the remainder of the evaluation will be completed by another provider, this initial triage assessment does not replace that evaluation, and the importance of remaining in the ED until their evaluation is complete.  Ct and labs   Sherene Sires, PA-C 05/01/21 2151    2152, MD 05/01/21 2342

## 2021-05-01 NOTE — ED Triage Notes (Signed)
Pt here for HA, nausea, vomiting, and decrease of appetite ever since "decompression surgery" on 04/08/2021. Pt tearful in triage, c/o 10/10 HA

## 2021-05-02 ENCOUNTER — Emergency Department (HOSPITAL_COMMUNITY): Payer: Medicaid Other

## 2021-05-02 LAB — COMPREHENSIVE METABOLIC PANEL
ALT: 11 U/L (ref 0–44)
AST: 17 U/L (ref 15–41)
Albumin: 4.2 g/dL (ref 3.5–5.0)
Alkaline Phosphatase: 75 U/L (ref 38–126)
Anion gap: 10 (ref 5–15)
BUN: 11 mg/dL (ref 6–20)
CO2: 25 mmol/L (ref 22–32)
Calcium: 10.7 mg/dL — ABNORMAL HIGH (ref 8.9–10.3)
Chloride: 99 mmol/L (ref 98–111)
Creatinine, Ser: 0.84 mg/dL (ref 0.44–1.00)
GFR, Estimated: >60 mL/min (ref >60)
Glucose, Bld: 80 mg/dL (ref 70–99)
Potassium: 4.3 mmol/L (ref 3.5–5.1)
Sodium: 134 mmol/L — ABNORMAL LOW (ref 135–145)
Total Bilirubin: 1 mg/dL (ref 0.3–1.2)
Total Protein: 7.3 g/dL (ref 6.5–8.1)

## 2021-05-02 MED ORDER — KETOROLAC TROMETHAMINE 30 MG/ML IJ SOLN
30.0000 mg | Freq: Once | INTRAMUSCULAR | Status: AC
  Administered 2021-05-02: 30 mg via INTRAVENOUS
  Filled 2021-05-02: qty 1

## 2021-05-02 MED ORDER — HALOPERIDOL LACTATE 5 MG/ML IJ SOLN
2.0000 mg | Freq: Once | INTRAMUSCULAR | Status: AC
  Administered 2021-05-02: 2 mg via INTRAVENOUS
  Filled 2021-05-02: qty 1

## 2021-05-02 MED ORDER — DEXAMETHASONE SODIUM PHOSPHATE 10 MG/ML IJ SOLN
10.0000 mg | Freq: Once | INTRAMUSCULAR | Status: AC
  Administered 2021-05-02: 10 mg via INTRAVENOUS
  Filled 2021-05-02: qty 1

## 2021-05-02 MED ORDER — PROCHLORPERAZINE MALEATE 10 MG PO TABS: 10.0000 mg | ORAL_TABLET | Freq: Two times a day (BID) | ORAL | 0 refills | Status: AC | PRN

## 2021-05-02 MED ORDER — OXYCODONE HCL 5 MG PO TABS
5.0000 mg | ORAL_TABLET | ORAL | 0 refills | Status: DC | PRN
Start: 2021-05-02 — End: 2021-06-05

## 2021-05-02 MED ORDER — METHYLPREDNISOLONE 4 MG PO TBPK: 4.0000 mg | ORAL_TABLET | ORAL | 0 refills | Status: DC

## 2021-05-02 MED ORDER — CYCLOBENZAPRINE HCL 10 MG PO TABS: 10.0000 mg | ORAL_TABLET | Freq: Three times a day (TID) | ORAL | 0 refills | Status: AC | PRN

## 2021-05-02 MED ORDER — MORPHINE SULFATE (PF) 4 MG/ML IV SOLN
4.0000 mg | Freq: Once | INTRAVENOUS | Status: AC
  Administered 2021-05-02: 4 mg via INTRAVENOUS
  Filled 2021-05-02: qty 1

## 2021-05-02 MED ORDER — LACTATED RINGERS IV BOLUS
1000.0000 mL | Freq: Once | INTRAVENOUS | Status: AC
  Administered 2021-05-02: 1000 mL via INTRAVENOUS

## 2021-05-02 MED ORDER — DIPHENHYDRAMINE HCL 50 MG/ML IJ SOLN
25.0000 mg | Freq: Once | INTRAMUSCULAR | Status: AC
  Administered 2021-05-02: 25 mg via INTRAVENOUS
  Filled 2021-05-02: qty 1

## 2021-05-02 MED ORDER — PROCHLORPERAZINE EDISYLATE 10 MG/2ML IJ SOLN
10.0000 mg | Freq: Once | INTRAMUSCULAR | Status: AC
  Administered 2021-05-02: 10 mg via INTRAVENOUS
  Filled 2021-05-02: qty 2

## 2021-05-02 MED ORDER — SODIUM CHLORIDE 0.9 % IV BOLUS
1000.0000 mL | Freq: Once | INTRAVENOUS | Status: AC
  Administered 2021-05-02: 1000 mL via INTRAVENOUS

## 2021-05-02 MED ORDER — GADOBUTROL 1 MMOL/ML IV SOLN
7.5000 mL | Freq: Once | INTRAVENOUS | Status: AC | PRN
  Administered 2021-05-02: 7.5 mL via INTRAVENOUS

## 2021-05-02 NOTE — Discharge Instructions (Addendum)
You may take tylenol or acetaminophen 1000mg  up to 4 times a day for 1 week. This is the maximum dose of Tylenol usually take from all sources. Please check other over-the-counter medications and prescriptions to ensure you are not taking other medications that contain acetaminophen.  You may also take ibuprofen 400 mg 6 times a day alternating with or at the same time as tylenol.  Recommend compazine and benadryl primarily for headache with hydration, you may also try flexeril the muscle relaxant. Take oxycodone as needed for breakthrough pain.  This medication can be addicting, sedating and cause constipation.

## 2021-05-02 NOTE — ED Notes (Signed)
MD at bedside. 

## 2021-05-02 NOTE — ED Provider Notes (Signed)
[facility] EMERGENCY DEPARTMENT Provider Note   CSN: [id number] Arrival date & time: 05/01/21  2110     History Chief Complaint  Patient presents with   Headache   Emesis    [name] is a 35 y.o. female.  The history is provided by the patient.  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  Headaches are at the vertex and she describes a throbbing pain which is as severe as 8/10.  Headaches are worse when she is sitting or standing, better when she is laying flat.  There is associated nausea and vomiting.  She has had some chills but denies fever or sweats.  She denies any visual change and denies any weakness or numbness.  She has taken oxycodone, 2 different antiemetics, and had a steroid taper without any improvement.  She was seen in the emergency department about 2 weeks ago and states that she actually felt a little better when she left the emergency department but got worse fairly quickly.  She has followed up with her neurosurgeon, next neurosurgery visit is in 6 weeks.  Of note, headaches are different from what she had prior to the surgery.   Past Medical History:  Diagnosis Date   Headache    Pregnancy as incidental finding     Patient Active Problem List   Diagnosis Date Noted   Chiari malformation type I (HCC) 04/08/2021   Chest pain of uncertain etiology 11/08/2020   Palpitations 11/08/2020   Acute cholecystitis 08/27/2020   Sinus bradycardia 08/27/2020   Postpartum care and examination 06/01/2018   Unwanted fertility 06/01/2018   Contraception management 06/01/2018   Vitamin D deficiency 10/21/2017   Tobacco abuse 10/13/2017    Past Surgical History:  Procedure Laterality Date   CHOLECYSTECTOMY N/A 08/27/2020   Procedure: LAPAROSCOPIC CHOLECYSTECTOMY;  Surgeon: [name], MD;  Location: [facility] OR;  Service: General;  Laterality: N/A;   LAPAROSCOPIC TUBAL LIGATION Bilateral 07/23/2018   Procedure: LAPAROSCOPIC TUBAL LIGATION;  Surgeon: Hermina Staggers, MD;  Location: [facility] ORS;  Service: Gynecology;  Laterality: Bilateral;   ORIF FINGER / THUMB FRACTURE     SUBOCCIPITAL CRANIECTOMY CERVICAL LAMINECTOMY N/A 04/08/2021   Procedure: Suboccipital craniectomy for chiari decompression;  Surgeon: Jadene Pierini, MD;  Location: [facility] OR;  Service: Neurosurgery;  Laterality: N/A;   WISDOM TOOTH EXTRACTION       OB History     Gravida  8   Para  4   Term  4   Preterm  0   AB  4   Living  4      SAB  3   IAB  1   Ectopic  0   Multiple  0   Live Births  4           Family History  Problem Relation Age of Onset   Hypertension Father    Stroke Father    Heart disease Father    Dementia Mother    Cancer Maternal Aunt        Breast   HIV Maternal Aunt    Stroke Maternal Uncle    Cancer Paternal Uncle        Colon   Cancer Maternal Grandmother    Hypertension Paternal Grandmother    Cancer Paternal Grandfather        Colon  Social History   Tobacco Use   Smoking status: Every Day    Packs/day: 0.50    Types: Cigarettes   Smokeless tobacco: Never  Vaping Use   Vaping Use: Former  Substance Use Topics   Alcohol use: No   Drug use: Yes    Types: Heroin    Comment: "started a week ago"    Home Medications Prior to Admission medications   Medication Sig Start Date End Date Taking? Authorizing Provider  acetaminophen (TYLENOL) 325 MG tablet Take 650 mg by mouth every 6 (six) hours as needed for moderate pain or headache.   Yes [provider]  cyclobenzaprine (FLEXERIL) 10 MG tablet Take 1 tablet (10 mg total) by mouth 3 (three) times daily as needed for muscle spasms. Patient not taking: No sig reported 04/10/21   Jadene Pierinistergard, Thomas A, MD  methylPREDNISolone (MEDROL DOSEPAK) 4 MG TBPK tablet Take 4 mg by mouth as  directed. Patient not taking: Reported on 05/02/2021 04/24/21   [provider]  metoCLOPramide (REGLAN) 10 MG tablet Take 10 mg by mouth 4 (four) times daily as needed. Patient not taking: Reported on 05/02/2021 04/24/21   [provider]  oxyCODONE (OXY IR/ROXICODONE) 5 MG immediate release tablet Take 1 tablet (5 mg total) by mouth every 4 (four) hours as needed for moderate pain ((score 4 to 6)). Patient not taking: No sig reported 04/10/21   Jadene Pierinistergard, Thomas A, MD  prochlorperazine (COMPAZINE) 10 MG tablet Take 1 tablet (10 mg total) by mouth 2 (two) times daily as needed for nausea or vomiting (headache). Patient not taking: No sig reported 04/15/21   Cardama, Amadeo GarnetPedro Eduardo, MD    Allergies    Patient has no known allergies.  Review of Systems   Review of Systems  Gastrointestinal:  Positive for vomiting.  Neurological:  Positive for headaches.  All other systems reviewed and are negative.  Physical Exam Updated Vital Signs BP 106/75   Pulse 96   Temp 97.7 F (36.5 C) (Oral)   Resp 20   Ht 5\' 3"  (1.6 m)   Wt 82 kg   SpO2 97%   BMI 32.02 kg/m   Physical Exam Vitals and nursing note reviewed.  35 year old female, resting comfortably and in no acute distress. Vital signs are normal. Oxygen saturation is 97%, which is normal. Head is normocephalic and atraumatic. PERRLA, EOMI. Oropharynx is clear. Neck is nontender and supple without adenopathy or JVD. Back is nontender and there is no CVA tenderness. Lungs are clear without rales, wheezes, or rhonchi. Chest is nontender. Heart has regular rate and rhythm without murmur. Abdomen is soft, flat, nontender without masses or hepatosplenomegaly and peristalsis is normoactive. Extremities have no cyanosis or edema, full range of motion is present. Skin is warm and dry without rash. Neurologic: Mental status is normal, cranial nerves are intact.  Strength is 5/5 in all extremities.  There are no sensory  deficits.  ED Results / Procedures / Treatments   Labs (all labs ordered are listed, but only abnormal results are displayed) Labs Reviewed  CBC WITH DIFFERENTIAL/PLATELET - Abnormal; Notable for the following components:      Result Value   WBC 11.1 (*)    All other components within normal limits  COMPREHENSIVE METABOLIC PANEL - Abnormal; Notable for the following components:   Sodium 134 (*)    Calcium 10.7 (*)    All other components within normal limits   Radiology CT HEAD WO CONTRAST (5MM)  Result  Date: 05/02/2021 CLINICAL DATA:  Recent posterior fossa decompression with headache, nausea and vomiting. EXAM: CT HEAD WITHOUT CONTRAST CT CERVICAL SPINE WITHOUT CONTRAST TECHNIQUE: Multidetector CT imaging of the head and cervical spine was performed following the standard protocol without intravenous contrast. Multiplanar CT image reconstructions of the cervical spine were also generated. COMPARISON:  None. FINDINGS: CT HEAD FINDINGS Brain: Chiari morphology. Status post central decompression of the posterior fossa. There are low-density collections on the right along the falx cerebri and right tentorial leaflet. No acute hemorrhage. No hydrocephalus. Vascular: No abnormal hyperdensity of the major intracranial arteries or dural venous sinuses. No intracranial atherosclerosis. Skull: Suboccipital craniectomy. 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 Sinuses/Orbits: No fluid levels or advanced mucosal thickening of the visualized paranasal sinuses. No mastoid or middle ear effusion. The orbits are normal. CT CERVICAL SPINE FINDINGS Alignment: No static subluxation. Facets are aligned. Occipital condyles are normally positioned. Skull base and vertebrae: No acute fracture. Soft tissues and spinal canal: No prevertebral fluid or swelling. No visible canal hematoma. Disc levels: No advanced spinal canal or neural foraminal stenosis. Upper chest: No pneumothorax, pulmonary nodule or pleural  effusion. Other: Normal visualized paraspinal cervical soft tissues. IMPRESSION: 1. 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 MRI may be helpful for better characterization. 2. 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 3. No acute abnormality of the cervical spine. Electronically Signed   By: Deatra Robinson M.D.   On: 05/02/2021 00:27   CT Cervical Spine Wo Contrast  Result Date: 05/02/2021 CLINICAL DATA:  Recent posterior fossa decompression with headache, nausea and vomiting. EXAM: CT HEAD WITHOUT CONTRAST CT CERVICAL SPINE WITHOUT CONTRAST TECHNIQUE: Multidetector CT imaging of the head and cervical spine was performed following the standard protocol without intravenous contrast. Multiplanar CT image reconstructions of the cervical spine were also generated. COMPARISON:  None. FINDINGS: CT HEAD FINDINGS Brain: Chiari morphology. Status post central decompression of the posterior fossa. There are low-density collections on the right along the falx cerebri and right tentorial leaflet. No acute hemorrhage. No hydrocephalus. Vascular: No abnormal hyperdensity of the major intracranial arteries or dural venous sinuses. No intracranial atherosclerosis. Skull: Suboccipital craniectomy. 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 Sinuses/Orbits: No fluid levels or advanced mucosal thickening of the visualized paranasal sinuses. No mastoid or middle ear effusion. The orbits are normal. CT CERVICAL SPINE FINDINGS Alignment: No static subluxation. Facets are aligned. Occipital condyles are normally positioned. Skull base and vertebrae: No acute fracture. Soft tissues and spinal canal: No prevertebral fluid or swelling. No visible canal hematoma. Disc levels: No advanced spinal canal or neural foraminal stenosis. Upper chest: No pneumothorax, pulmonary nodule or  pleural effusion. Other: Normal visualized paraspinal cervical soft tissues. IMPRESSION: 1. 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 MRI may be helpful for better characterization. 2. 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 3. No acute abnormality of the cervical spine. Electronically Signed   By: Deatra Robinson M.D.   On: 05/02/2021 00:27    Procedures Procedures   Medications Ordered in ED Medications  oxyCODONE-acetaminophen (PERCOCET/ROXICET) 5-325 MG per tablet 1 tablet (has no administration in time range)  lactated ringers bolus 1,000 mL (has no administration in time range)  prochlorperazine (COMPAZINE) injection 10 mg (has no administration in time range)  diphenhydrAMINE (BENADRYL) injection 25 mg (has no administration in time range)    ED Course  I have reviewed the triage vital signs and the nursing notes.  Pertinent labs & imaging results that were available during my care of the patient were reviewed by me and considered in my medical decision making (see chart for details).   MDM Rules/Calculators/A&P                         Persistent postoperative headache.  Old records are reviewed confirming surgery for Chiari malformation on 7/11, ED visit for postoperative headache on 7/17.  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  We will try migraine cocktail with prochlorperazine, diphenhydramine.  5:04 AM She had no relief of headache.  She was given morphine and sent for MRI of the brain.  7:31 AM MRI has been completed, report pending.  Case is signed out to Dr. Dalene Seltzer.  Final Clinical Impression(s) / ED Diagnoses Final diagnoses:  Bad headache    Rx / DC Orders ED Discharge Orders     None        Dione Booze, MD 05/02/21 [phone]

## 2021-05-02 NOTE — ED Provider Notes (Signed)
  Physical Exam  BP 94/60   Pulse 77   Temp 97.7 F (36.5 C) (Oral)   Resp 18   Ht 5\' 3"  (1.6 m)   Wt 82 kg   SpO2 98%   BMI 32.02 kg/m   Physical Exam  ED Course/Procedures     Procedures  MDM  Received care of patient from Dr. .  Please see his note for prior history, physical and care.  Briefly this is a 35 year old female with a history of recent craniectomy for Chiari I malformation with Dr. 20 on July 11, emergency department visit on July 18, who presents with concern for continued headache.  MRI pending  MRI shows hygroma and findings which may be consistent with intracranial hypotension.  She does describe a headache that is worse when she sits up and better when she lays down.  Discussed this with Dr. 09-25-1976 neurosurgery who discussed with Dr. Jake Samples.  Recommends continued supportive care and follow-up with Dr. Johnsie Cancel.    She feels improved with treatment, will discharge with close follow up, return for fever or other concerns.       Johnsie Cancel, MD 05/02/21 [phone]

## 2021-05-02 NOTE — ED Notes (Signed)
Pt to MRI

## 2021-05-20 ENCOUNTER — Emergency Department (HOSPITAL_COMMUNITY)
Admission: EM | Admit: 2021-05-20 | Discharge: 2021-05-21 | Disposition: A | Discharge status: Home / Self Care | Payer: Medicaid Other | Attending: Emergency Medicine | Admitting: Emergency Medicine

## 2021-05-20 ENCOUNTER — Other Ambulatory Visit: Payer: Self-pay

## 2021-05-20 ENCOUNTER — Encounter (HOSPITAL_COMMUNITY): Payer: Self-pay | Admitting: Emergency Medicine

## 2021-05-20 DIAGNOSIS — R112 Nausea with vomiting, unspecified: Secondary | ICD-10-CM | POA: Insufficient documentation

## 2021-05-20 DIAGNOSIS — R519 Headache, unspecified: Secondary | ICD-10-CM | POA: Diagnosis present

## 2021-05-20 DIAGNOSIS — Z20822 Contact with and (suspected) exposure to covid-19: Secondary | ICD-10-CM | POA: Diagnosis not present

## 2021-05-20 DIAGNOSIS — G96198 Other disorders of meninges, not elsewhere classified: Secondary | ICD-10-CM | POA: Insufficient documentation

## 2021-05-20 DIAGNOSIS — F17210 Nicotine dependence, cigarettes, uncomplicated: Secondary | ICD-10-CM | POA: Insufficient documentation

## 2021-05-20 NOTE — ED Provider Notes (Signed)
Emergency Medicine Provider Triage Evaluation Note  Brooke Carrillo , a 35 y.o. female  was evaluated in triage.  Pt complains of worsening headache over the last several weeks.  Patient had craniotomy for Chiari malformation by Dr. Johnsie Cancel on July 11.  She was seen on August 4 for ongoing headaches with a reassuring exam and MRI.  Since that time her headaches have become constant she has associated dizziness, numbness in her bilateral upper extremities, persistent nausea and vomiting for the last 3 days.  Patient denies falls or known injury.  Reports that the swelling along the incision has become much larger and is somewhat tender.  Denies numbness, tingling or weakness in her lower extremities.  Review of Systems  Positive: Headache, paresthesias of the bilateral upper arms, neck pain, neck swelling Negative: Fever, chills  Physical Exam  BP 108/76 (BP Location: Left Arm)   Pulse 80   Temp 98.3 F (36.8 C) (Oral)   Resp 16   Ht 5\' 3"  (1.6 m)   Wt 80 kg   SpO2 100%   BMI 31.24 kg/m  Gen:   Awake, no distress   Resp:  Normal effort  MSK:   Moves extremities without difficulty normal gait and balance Other:  Surgical incision clean and healing well.  Swelling with palpable fluctuance and tenderness.  No induration.  Medical Decision Making  Medically screening exam initiated at 11:15 PM.  Appropriate orders placed.  Brooke Carrillo was informed that the remainder of the evaluation will be completed by another provider, this initial triage assessment does not replace that evaluation, and the importance of remaining in the ED until their evaluation is complete.  Ongoing headaches, now with nausea, vomiting, paresthesias of the upper extremities.  Patient swelling around the incision site concerning for seroma.  Will obtain repeat MRI.   Brooke Carrillo, Brooke Carrillo 05/20/21 2323    05/22/21, MD 05/20/21 2325

## 2021-05-20 NOTE — ED Triage Notes (Signed)
Patient reports persistent headache after brain surgery last 04/08/21 , she added " knot" at posterior neck onset 2 weeks ago , no fever or chills.

## 2021-05-21 ENCOUNTER — Emergency Department (HOSPITAL_COMMUNITY): Payer: Medicaid Other

## 2021-05-21 LAB — CBC WITH DIFFERENTIAL/PLATELET
Abs Immature Granulocytes: 0.02 10*3/uL (ref 0.00–0.07)
Basophils Absolute: 0 10*3/uL (ref 0.0–0.1)
Basophils Relative: 0 %
Eosinophils Absolute: 0.4 10*3/uL (ref 0.0–0.5)
Eosinophils Relative: 4 %
HCT: 36.5 % (ref 36.0–46.0)
Hemoglobin: 12 g/dL (ref 12.0–15.0)
Immature Granulocytes: 0 %
Lymphocytes Relative: 44 %
Lymphs Abs: 4.6 10*3/uL — ABNORMAL HIGH (ref 0.7–4.0)
MCH: 32 pg (ref 26.0–34.0)
MCHC: 32.9 g/dL (ref 30.0–36.0)
MCV: 97.3 fL (ref 80.0–100.0)
Monocytes Absolute: 0.5 10*3/uL (ref 0.1–1.0)
Monocytes Relative: 5 %
Neutro Abs: 4.9 10*3/uL (ref 1.7–7.7)
Neutrophils Relative %: 47 %
Platelets: 262 10*3/uL (ref 150–400)
RBC: 3.75 MIL/uL — ABNORMAL LOW (ref 3.87–5.11)
RDW: 12.2 % (ref 11.5–15.5)
WBC: 10.4 10*3/uL (ref 4.0–10.5)
nRBC: 0 % (ref 0.0–0.2)

## 2021-05-21 LAB — CSF CELL COUNT WITH DIFFERENTIAL
Eosinophils, CSF: 10 % — ABNORMAL HIGH (ref 0–1)
Eosinophils, CSF: 15 % — ABNORMAL HIGH (ref 0–1)
Lymphs, CSF: 78 % (ref 40–80)
Lymphs, CSF: 73 % (ref 40–80)
Monocyte-Macrophage-Spinal Fluid: 11 % — ABNORMAL LOW (ref 15–45)
Monocyte-Macrophage-Spinal Fluid: 11 % — ABNORMAL LOW (ref 15–45)
Other Cells, CSF: 1
Other Cells, CSF: 1
RBC Count, CSF: 83 /mm3 — ABNORMAL HIGH
RBC Count, CSF: 3 /mm3 — ABNORMAL HIGH
Segmented Neutrophils-CSF: 0 % (ref 0–6)
Segmented Neutrophils-CSF: 0 % (ref 0–6)
Tube #: 1
Tube #: 4
WBC, CSF: 84 /mm3 (ref 0–5)
WBC, CSF: 85 /mm3 (ref 0–5)

## 2021-05-21 LAB — BASIC METABOLIC PANEL
Anion gap: 5 (ref 5–15)
BUN: 7 mg/dL (ref 6–20)
CO2: 31 mmol/L (ref 22–32)
Calcium: 9.7 mg/dL (ref 8.9–10.3)
Chloride: 102 mmol/L (ref 98–111)
Creatinine, Ser: 0.79 mg/dL (ref 0.44–1.00)
GFR, Estimated: >60 mL/min (ref >60)
Glucose, Bld: 91 mg/dL (ref 70–99)
Potassium: 3.8 mmol/L (ref 3.5–5.1)
Sodium: 138 mmol/L (ref 135–145)

## 2021-05-21 LAB — PROTEIN AND GLUCOSE, CSF
Glucose, CSF: <20 mg/dL — CL (ref 40–70)
Total  Protein, CSF: 60 mg/dL — ABNORMAL HIGH (ref 15–45)

## 2021-05-21 LAB — RESP PANEL BY RT-PCR (FLU A&B, COVID) ARPGX2
Influenza A by PCR: NEGATIVE
Influenza B by PCR: NEGATIVE
SARS Coronavirus 2 by RT PCR: NEGATIVE

## 2021-05-21 MED ORDER — LIDOCAINE HCL (PF) 1 % IJ SOLN
INTRAMUSCULAR | Status: AC
  Administered 2021-05-21: 5 mL via INTRADERMAL
  Filled 2021-05-21: qty 5

## 2021-05-21 MED ORDER — LIDOCAINE HCL (PF) 1 % IJ SOLN: 5.0000 mL | Freq: Once | INTRAMUSCULAR | Status: AC

## 2021-05-21 MED ORDER — GADOBUTROL 1 MMOL/ML IV SOLN
8.0000 mL | Freq: Once | INTRAVENOUS | Status: AC | PRN
  Administered 2021-05-21: 8 mL via INTRAVENOUS

## 2021-05-21 NOTE — Discharge Instructions (Addendum)
Follow-up with Dr. Maurice Small as discussed.  Return here as needed if you have any worsening symptoms.

## 2021-05-21 NOTE — Consult Note (Signed)
Neurosurgery Consultation  Reason for Consult: HA/neck swelling Referring Physician: Belfi  CC: Headache  HPI: This is a 35 y.o. woman in whom I previously did a chiari decompression that presents with headaches / N/V, no visual changes. Headaches are improved somewhat when laying flat, N/V improved w/ meds. Her neck also became more prominent where she has a pseudomeningocele, which concerned her so she came in to the ED. Right now she looks better than she was feeling previously, but still has a headache that is exacerbated by position. No new weakness, numbness, or parasthesias, no recent change in bowel or bladder function. No recent use of anti-platelet or anti-coagulant medications. No fevers / chills / neck stiffness, no leaking from the incision   ROS: A 14 point ROS was performed and is negative except as noted in the HPI.   PMHx:  Past Medical History:  Diagnosis Date   Headache    Pregnancy as incidental finding    FamHx:  Family History  Problem Relation Age of Onset   Hypertension Father    Stroke Father    Heart disease Father    Dementia Mother    Cancer Maternal Aunt        Breast   HIV Maternal Aunt    Stroke Maternal Uncle    Cancer Paternal Uncle        Colon   Cancer Maternal Grandmother    Hypertension Paternal Grandmother    Cancer Paternal Grandfather        Colon   SocHx:  reports that she has been smoking cigarettes. She has been smoking an average of .5 packs per day. She has never used smokeless tobacco. 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 She reports that she does not drink alcohol.  Exam: Vital signs in last 24 hours: Temp:  [98 F (36.7 C)-98.3 F (36.8 C)] 98 F (36.7 C) (08/23 0431) Pulse Rate:  [48-80] 59 (08/23 1200) Resp:  [11-18] 15 (08/23 1200) BP: (98-132)/(60-81) 115/81 (08/23 1200) SpO2:  [97 %-100 %] 100 % (08/23 1200) Weight:  [80 kg] 80 kg (08/22 2253) General: Awake, alert, cooperative, lying in bed in NAD Head:  Normocephalic and atruamatic HEENT: Neck supple Pulmonary: breathing room air comfortably, no evidence of increased work of breathing Cardiac: RRR Abdomen: S NT ND Extremities: Warm and well perfused x4 Neuro: AOx3, PERRL, EOMI, FS Strength 5/5 x4, SILTx4 Incision well healed but 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 woman w/ prior chiari decompression w/ continued pseudomeningocele now with HA/N/V. MRI personally reviewed, which shows some posterior fossa hygromas, ventricles are still slightly increased from her original scan but no overly enlarged, large pseudomeningocele. Discussed with the patient at length, suspect she likely has hydrocephalus, given the ventricular size change and the size of her pseudomeningocele. I drained it at bedside with sterile technique to assess for infection. I offered admission to wait on the results, she would like to go home and see if her symptoms return, which I think is fine. She knows what symptoms to watch for and will let me know if they return.   -no acute neurosurgical intervention indicated at this time -pseudomeningocele tapped at bedside, CSF sent for usual studies -pt has a f/u with me tomorrow, if she's still feeling well and the pseudomeningocele hasn't recurred, told her she can cancel it and reschedule for 2 weeks  Jadene Pierini, MD 05/21/21 12:32 PM [facility]

## 2021-05-21 NOTE — ED Notes (Signed)
Pt not roomed at this time,

## 2021-05-21 NOTE — ED Provider Notes (Signed)
Female [facility] EMERGENCY DEPARTMENT Provider Note   CSN: [id number] Arrival date & time: 05/20/21  2236     History Chief Complaint  Patient presents with   Headache / "knot" back of neck    Brooke Carrillo is a 35 y.o. female.  Patient is a 35 year old female who presents with worsening headaches.  She recently had a suboccipital craniectomy/cervical laminectomy to relieve a Chiari I malformation on July 11.  She has been having some worsening headaches since that time.  She had an MRI on ED visit August 5 which was nonconcerning.  However she is continuing to have worsening headaches and now has tingling in both of her hands which she had had prior to the surgery.  She denies any weakness in her arms.  No numbness or tingling in her lower extremities.  No difficulty with her balance.  She has had some nausea and vomiting associated with the headache.  No vision changes.  No difficulty with her balance.  She has noted some swelling at the incision site.      Past Medical History:  Diagnosis Date   Headache    Pregnancy as incidental finding     Patient Active Problem List   Diagnosis Date Noted   Chiari malformation type I (HCC) 04/08/2021   Chest pain of uncertain etiology 11/08/2020   Palpitations 11/08/2020   Acute cholecystitis 08/27/2020   Sinus bradycardia 08/27/2020   Postpartum care and examination 06/01/2018   Unwanted fertility 06/01/2018   Contraception management 06/01/2018   Vitamin D deficiency 10/21/2017   Tobacco abuse 10/13/2017    Past Surgical History:  Procedure Laterality Date   CHOLECYSTECTOMY N/A 08/27/2020   Procedure: LAPAROSCOPIC CHOLECYSTECTOMY;  Surgeon: [name], MD;  Location: [facility] OR;  Service: General;  Laterality: N/A;   LAPAROSCOPIC TUBAL LIGATION Bilateral 07/23/2018   Procedure: LAPAROSCOPIC TUBAL LIGATION;  Surgeon: Hermina Staggers, MD;  Location: [facility]S;  Service: Gynecology;  Laterality: Bilateral;    ORIF FINGER / THUMB FRACTURE     SUBOCCIPITAL CRANIECTOMY CERVICAL LAMINECTOMY N/A 04/08/2021   Procedure: Suboccipital craniectomy for chiari decompression;  Surgeon: Jadene Pierini, MD;  Location: [facility] OR;  Service: Neurosurgery;  Laterality: N/A;   WISDOM TOOTH EXTRACTION       OB History     Gravida  8   Para  4   Term  4   Preterm  0   AB  4   Living  4      SAB  3   IAB  1   Ectopic  0   Multiple  0   Live Births  4           Family History  Problem Relation Age of Onset   Hypertension Father    Stroke Father    Heart disease Father    Dementia Mother    Cancer Maternal Aunt        Breast   HIV Maternal Aunt    Stroke Maternal Uncle    Cancer Paternal Uncle        Colon   Cancer Maternal Grandmother    Hypertension Paternal Grandmother    Cancer Paternal Grandfather        Colon    Social History   Tobacco Use   Smoking status: Every Day    Packs/day: 0.50    Types: Cigarettes   Smokeless tobacco: Never  Vaping Use   Vaping Use: Former  Substance Use Topics  Alcohol use: No   Drug use: Yes    Types: Heroin    Comment: "started a week ago"    Home Medications Prior to Admission medications   Medication Sig Start Date End Date Taking? Authorizing Provider  acetaminophen (TYLENOL) 325 MG tablet Take 650 mg by mouth every 6 (six) hours as needed for moderate pain or headache.    [provider]  cyclobenzaprine (FLEXERIL) 10 MG tablet Take 1 tablet (10 mg total) by mouth 3 (three) times daily as needed for muscle spasms. 05/02/21   Alvira Monday, MD  methylPREDNISolone (MEDROL DOSEPAK) 4 MG TBPK tablet Take 1 tablet (4 mg total) by mouth as directed. 05/02/21   Alvira Monday, MD  metoCLOPramide (REGLAN) 10 MG tablet Take 10 mg by mouth 4 (four) times daily as needed. Patient not taking: Reported on 05/02/2021 04/24/21   [provider]  oxyCODONE (OXY IR/ROXICODONE) 5 MG immediate release tablet Take 1 tablet (5 mg  total) by mouth every 4 (four) hours as needed for moderate pain ((score 4 to 6)). Patient not taking: No sig reported 04/10/21   Jadene Pierini, MD  oxyCODONE (ROXICODONE) 5 MG immediate release tablet Take 1 tablet (5 mg total) by mouth every 4 (four) hours as needed for severe pain. 05/02/21   Alvira Monday, MD  prochlorperazine (COMPAZINE) 10 MG tablet Take 1 tablet (10 mg total) by mouth 2 (two) times daily as needed for nausea or vomiting (headache). 05/02/21   Alvira Monday, MD    Allergies    Patient has no known allergies.  Review of Systems   Review of Systems  Constitutional:  Negative for chills, diaphoresis, fatigue and fever.  HENT:  Negative for congestion, rhinorrhea and sneezing.   Eyes: Negative.   Respiratory:  Negative for cough, chest tightness and shortness of breath.   Cardiovascular:  Negative for chest pain and leg swelling.  Gastrointestinal:  Negative for abdominal pain, blood in stool, diarrhea, nausea and vomiting.  Genitourinary:  Negative for difficulty urinating, flank pain, frequency and hematuria.  Musculoskeletal:  Negative for arthralgias and back pain.  Skin:  Negative for rash.  Neurological:  Positive for numbness and headaches. Negative for dizziness, speech difficulty and weakness.   Physical Exam Updated Vital Signs BP 115/81   Pulse (!) 59   Temp 98 F (36.7 C)   Resp 15   Ht 5\' 3"  (1.6 m)   Wt 80 kg   SpO2 100%   BMI 31.24 kg/m   Physical Exam Constitutional:      Appearance: She is well-developed.  HENT:     Head: Normocephalic and atraumatic.  Eyes:     Pupils: Pupils are equal, round, and reactive to light.  Neck:     Comments: Patient has a healing incision to the posterior upper cervical spine.  There is no warmth or erythema.  No drainage.  She does have a fluctuant fluid collection present. Cardiovascular:     Rate and Rhythm: Normal rate and regular rhythm.     Heart sounds: Normal heart sounds.  Pulmonary:      Effort: Pulmonary effort is normal. No respiratory distress.     Breath sounds: Normal breath sounds. No wheezing or rales.  Chest:     Chest wall: No tenderness.  Abdominal:     General: Bowel sounds are normal.     Palpations: Abdomen is soft.     Tenderness: There is no abdominal tenderness. There is no guarding or rebound.  Musculoskeletal:  General: Normal range of motion.     Cervical back: Normal range of motion and neck supple.  Lymphadenopathy:     Cervical: No cervical adenopathy.  Skin:    General: Skin is warm and dry.     Findings: No rash.  Neurological:     General: No focal deficit present.     Mental Status: She is alert and oriented to person, place, and time.    ED Results / Procedures / Treatments   Labs (all labs ordered are listed, but only abnormal results are displayed) Labs Reviewed  CBC WITH DIFFERENTIAL/PLATELET - Abnormal; Notable for the following components:      Result Value   RBC 3.75 (*)    Lymphs Abs 4.6 (*)    All other components within normal limits  RESP PANEL BY RT-PCR (FLU A&B, COVID) ARPGX2  CSF CULTURE W GRAM STAIN  BASIC METABOLIC PANEL  CSF CELL COUNT WITH DIFFERENTIAL  CSF CELL COUNT WITH DIFFERENTIAL  PROTEIN AND GLUCOSE, CSF    EKG None  Radiology MR BRAIN W WO CONTRAST  Result Date: 05/21/2021 CLINICAL DATA:  35 year old female with increasing headache for several weeks. Status post suboccipital decompression for Chiari on July 11th. Nausea, vomiting, dizziness. EXAM: MRI HEAD WITHOUT AND WITH CONTRAST MRI CERVICAL SPINE WITHOUT AND WITH CONTRAST TECHNIQUE: Multiplanar, multiecho pulse sequences of the brain and surrounding structures, and cervical spine, to include the craniocervical junction and cervicothoracic junction, were obtained without and with intravenous contrast. CONTRAST:  41mL GADAVIST GADOBUTROL 1 MMOL/ML IV SOLN COMPARISON:  Postoperative brain MRI 05/02/2021 and earlier. 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 This is about 6.6 cm in length and stable since 05/02/2021. Crowded appearance of the cisterna magna is associated with small volume extra-axial fluid in the posterior fossa, more apparent about the right cerebellum now (series 10, image 5). This fluid is isointense to CSF with no significant dural thickening following contrast. Other basilar cisterns are patent, with preserved suprasellar cistern and interpeduncular cistern. Previously seen trace para falcine subdural hygroma has resolved. No supratentorial extra-axial fluid is evident now. No superimposed restricted diffusion to suggest acute infarction. No midline shift, mass effect, evidence of mass lesion, ventriculomegaly, or acute intracranial hemorrhage. Cervicomedullary junction and pituitary are within normal limits. Wallace Cullens and white matter signal remains within normal limits. Postoperative susceptibility at the craniectomy. No parenchymal hemosiderin. Following contrast there is no convincing dural thickening. Similar enhancement of the leptomeninges which is probably physiologic. Vascular: Major intracranial vascular flow voids are stable. The major dural venous sinuses are enhancing and appear to be patent and seem to have a relatively normal size. Skull and upper cervical spine: Cervical spine detailed below. Sinuses/Orbits: Stable, negative. Other: Mastoids remain clear. Visible internal auditory structures appear normal. Circumscribed suboccipital scalp fluid collection appears unchanged since 05/02/2021. Other scalp soft tissues appear negative. MRI CERVICAL SPINE FINDINGS Alignment: Straightening of cervical lordosis. No spondylolisthesis. Vertebrae: Suboccipital decompression. No marrow edema or evidence of acute osseous abnormality. Normal background bone marrow signal. Cord: Normal. Visible upper thoracic  spinal cord also appears normal. No definite abnormal intradural enhancement. Enhancing anterior spinal artery incidentally noted. Posterior Fossa, vertebral arteries, paraspinal tissues: CSF hygromas suboccipital fluid collection with thin rim enhancement is inseparable from the posterior cerebellum and there is a small 5 mm focus of the midline posterior cerebellar parenchyma herniated into the collection best seen on series 21, image 11. This is also  visible on the brain image 9 of series 27. Crowding of the cisterna magna, but no signal abnormality identified in the cerebellum or cervicomedullary junction. Major vascular flow voids in the neck are preserved. Other neck soft tissues appear negative. Disc levels: Minor cervical spine degeneration, including mild disc bulging at C4-C5. No cervical spinal stenosis. No foraminal stenosis. IMPRESSION: 1. Suboccipital decompression with a stable postoperative midline fluid collection (~ 70 mL) compatible with pseudomeningocele. Note that there is a small 5 mm focus of herniated cerebellar parenchyma into the collection best seen on cervical spine series 21 image 11. Furthermore, there is continued crowding of the cisterna magna - not improved since 05/02/2021, and associated with now bilateral posterior fossa CSF hygromas (3-4 mm), but no dural thickening or enhancement. 2. No signal changes identified in the brain, brainstem, or cervical spinal cord. A small right para falcine subdural hygroma has resolved since 05/02/2021. 3. Minor cervical spine degeneration.  No cervical spinal stenosis. Electronically Signed   By: Odessa FlemingH  Hall M.D.   On: 05/21/2021 04:50   MR Cervical Spine W or Wo Contrast  Result Date: 05/21/2021 CLINICAL DATA:  35 year old female with increasing headache for several weeks. Status post suboccipital decompression for Chiari on July 11th. Nausea, vomiting, dizziness. EXAM: MRI HEAD WITHOUT AND WITH CONTRAST MRI CERVICAL SPINE WITHOUT AND WITH  CONTRAST TECHNIQUE: Multiplanar, multiecho pulse sequences of the brain and surrounding structures, and cervical spine, to include the craniocervical junction and cervicothoracic junction, were obtained without and with intravenous contrast. CONTRAST:  8mL GADAVIST GADOBUTROL 1 MMOL/ML IV SOLN COMPARISON:  Postoperative brain MRI 05/02/2021 and earlier. 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 This is about 6.6 cm in length and stable since 05/02/2021. Crowded appearance of the cisterna magna is associated with small volume extra-axial fluid in the posterior fossa, more apparent about the right cerebellum now (series 10, image 5). This fluid is isointense to CSF with no significant dural thickening following contrast. Other basilar cisterns are patent, with preserved suprasellar cistern and interpeduncular cistern. Previously seen trace para falcine subdural hygroma has resolved. No supratentorial extra-axial fluid is evident now. No superimposed restricted diffusion to suggest acute infarction. No midline shift, mass effect, evidence of mass lesion, ventriculomegaly, or acute intracranial hemorrhage. Cervicomedullary junction and pituitary are within normal limits. Wallace CullensGray and white matter signal remains within normal limits. Postoperative susceptibility at the craniectomy. No parenchymal hemosiderin. Following contrast there is no convincing dural thickening. Similar enhancement of the leptomeninges which is probably physiologic. Vascular: Major intracranial vascular flow voids are stable. The major dural venous sinuses are enhancing and appear to be patent and seem to have a relatively normal size. Skull and upper cervical spine: Cervical spine detailed below. Sinuses/Orbits: Stable, negative. Other: Mastoids remain clear. Visible internal auditory structures appear normal. Circumscribed  suboccipital scalp fluid collection appears unchanged since 05/02/2021. Other scalp soft tissues appear negative. MRI CERVICAL SPINE FINDINGS Alignment: Straightening of cervical lordosis. No spondylolisthesis. Vertebrae: Suboccipital decompression. No marrow edema or evidence of acute osseous abnormality. Normal background bone marrow signal. Cord: Normal. Visible upper thoracic spinal cord also appears normal. No definite abnormal intradural enhancement. Enhancing anterior spinal artery incidentally noted. Posterior Fossa, vertebral arteries, paraspinal tissues: CSF hygromas suboccipital fluid collection with thin rim enhancement is inseparable from the posterior cerebellum and there is a small 5 mm focus of the midline posterior cerebellar parenchyma herniated into the collection best seen on series 21, image  11. This is also visible on the brain image 9 of series 27. Crowding of the cisterna magna, but no signal abnormality identified in the cerebellum or cervicomedullary junction. Major vascular flow voids in the neck are preserved. Other neck soft tissues appear negative. Disc levels: Minor cervical spine degeneration, including mild disc bulging at C4-C5. No cervical spinal stenosis. No foraminal stenosis. IMPRESSION: 1. Suboccipital decompression with a stable postoperative midline fluid collection (~ 70 mL) compatible with pseudomeningocele. Note that there is a small 5 mm focus of herniated cerebellar parenchyma into the collection best seen on cervical spine series 21 image 11. Furthermore, there is continued crowding of the cisterna magna - not improved since 05/02/2021, and associated with now bilateral posterior fossa CSF hygromas (3-4 mm), but no dural thickening or enhancement. 2. No signal changes identified in the brain, brainstem, or cervical spinal cord. A small right para falcine subdural hygroma has resolved since 05/02/2021. 3. Minor cervical spine degeneration.  No cervical spinal stenosis.  Electronically Signed   By: Odessa Fleming M.D.   On: 05/21/2021 04:50    Procedures Procedures   Medications Ordered in ED Medications  gadobutrol (GADAVIST) 1 MMOL/ML injection 8 mL (8 mLs Intravenous Contrast Given 05/21/21 0359)  lidocaine (PF) (XYLOCAINE) 1 % injection 5 mL (5 mLs Intradermal Given by Other 05/21/21 1240)    ED Course  I have reviewed the triage vital signs and the nursing notes.  Pertinent labs & imaging results that were available during my care of the patient were reviewed by me and considered in my medical decision making (see chart for details).    MDM Rules/Calculators/A&P                           Patient is a 35 year old man who presents with worsening headaches after a Chiari I malformation decompression surgery.  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  Dr. Maurice Small with neurosurgery came down to evaluate the patient and was able to withdraw some CSF fluid from the site.  They have discussed the plan and patient is to be discharged.  If she has any return of symptoms, she likely will need surgery to have a shunt placed.  She will follow-up with Dr. Maurice Small. Final Clinical Impression(s) / ED Diagnoses Final diagnoses:  Pseudomeningocele    Rx / DC Orders ED Discharge Orders     None        Rolan Bucco, MD 05/21/21 1246

## 2021-05-23 LAB — PATHOLOGIST SMEAR REVIEW
Path Review: INCREASED
Path Review: INCREASED

## 2021-05-24 LAB — CSF CULTURE W GRAM STAIN: Culture: NO GROWTH

## 2021-06-03 ENCOUNTER — Inpatient Hospital Stay (HOSPITAL_COMMUNITY)
Admission: EM | Admit: 2021-06-03 | Discharge: 2021-06-05 | DRG: 032 | Disposition: A | Discharge status: Home / Self Care | Payer: Medicaid Other | Attending: Neurological Surgery | Admitting: Neurological Surgery

## 2021-06-03 ENCOUNTER — Other Ambulatory Visit: Payer: Self-pay

## 2021-06-03 ENCOUNTER — Encounter (HOSPITAL_COMMUNITY): Payer: Self-pay | Admitting: *Deleted

## 2021-06-03 ENCOUNTER — Emergency Department (HOSPITAL_COMMUNITY): Payer: Medicaid Other

## 2021-06-03 DIAGNOSIS — Z20822 Contact with and (suspected) exposure to covid-19: Secondary | ICD-10-CM | POA: Diagnosis present

## 2021-06-03 DIAGNOSIS — Z823 Family history of stroke: Secondary | ICD-10-CM

## 2021-06-03 DIAGNOSIS — D72829 Elevated white blood cell count, unspecified: Secondary | ICD-10-CM | POA: Diagnosis not present

## 2021-06-03 DIAGNOSIS — G9600 Cerebrospinal fluid leak, unspecified: Secondary | ICD-10-CM

## 2021-06-03 DIAGNOSIS — F17210 Nicotine dependence, cigarettes, uncomplicated: Secondary | ICD-10-CM | POA: Diagnosis not present

## 2021-06-03 DIAGNOSIS — G910 Communicating hydrocephalus: Principal | ICD-10-CM | POA: Diagnosis present

## 2021-06-03 DIAGNOSIS — G96198 Other disorders of meninges, not elsewhere classified: Secondary | ICD-10-CM

## 2021-06-03 DIAGNOSIS — Z9049 Acquired absence of other specified parts of digestive tract: Secondary | ICD-10-CM

## 2021-06-03 DIAGNOSIS — Z803 Family history of malignant neoplasm of breast: Secondary | ICD-10-CM | POA: Diagnosis not present

## 2021-06-03 DIAGNOSIS — G9608 Other cranial cerebrospinal fluid leak: Secondary | ICD-10-CM

## 2021-06-03 DIAGNOSIS — G9782 Other postprocedural complications and disorders of nervous system: Secondary | ICD-10-CM

## 2021-06-03 DIAGNOSIS — Z8249 Family history of ischemic heart disease and other diseases of the circulatory system: Secondary | ICD-10-CM

## 2021-06-03 DIAGNOSIS — G919 Hydrocephalus, unspecified: Secondary | ICD-10-CM

## 2021-06-03 LAB — BASIC METABOLIC PANEL
Anion gap: 11 (ref 5–15)
BUN: 9 mg/dL (ref 6–20)
CO2: 31 mmol/L (ref 22–32)
Calcium: 11.6 mg/dL — ABNORMAL HIGH (ref 8.9–10.3)
Chloride: 100 mmol/L (ref 98–111)
Creatinine, Ser: 0.77 mg/dL (ref 0.44–1.00)
GFR, Estimated: >60 mL/min (ref >60)
Glucose, Bld: 119 mg/dL — ABNORMAL HIGH (ref 70–99)
Potassium: 3.5 mmol/L (ref 3.5–5.1)
Sodium: 142 mmol/L (ref 135–145)

## 2021-06-03 LAB — I-STAT BETA HCG BLOOD, ED (MC, WL, AP ONLY): I-stat hCG, quantitative: <5 m[IU]/mL (ref <5)

## 2021-06-03 LAB — CBC
HCT: 43.1 % (ref 36.0–46.0)
HCT: 41.6 % (ref 36.0–46.0)
Hemoglobin: 14.8 g/dL (ref 12.0–15.0)
Hemoglobin: 14 g/dL (ref 12.0–15.0)
MCH: 32.7 pg (ref 26.0–34.0)
MCH: 32.3 pg (ref 26.0–34.0)
MCHC: 34.3 g/dL (ref 30.0–36.0)
MCHC: 33.7 g/dL (ref 30.0–36.0)
MCV: 95.1 fL (ref 80.0–100.0)
MCV: 96.1 fL (ref 80.0–100.0)
Platelets: 343 10*3/uL (ref 150–400)
Platelets: 356 10*3/uL (ref 150–400)
RBC: 4.53 MIL/uL (ref 3.87–5.11)
RBC: 4.33 MIL/uL (ref 3.87–5.11)
RDW: 12.5 % (ref 11.5–15.5)
RDW: 12.7 % (ref 11.5–15.5)
WBC: 14.3 10*3/uL — ABNORMAL HIGH (ref 4.0–10.5)
WBC: 13.9 10*3/uL — ABNORMAL HIGH (ref 4.0–10.5)
nRBC: 0 % (ref 0.0–0.2)
nRBC: 0 % (ref 0.0–0.2)

## 2021-06-03 LAB — RESP PANEL BY RT-PCR (FLU A&B, COVID) ARPGX2
Influenza A by PCR: NEGATIVE
Influenza B by PCR: NEGATIVE
SARS Coronavirus 2 by RT PCR: NEGATIVE

## 2021-06-03 MED ORDER — POLYETHYLENE GLYCOL 3350 17 G PO PACK: 17.0000 g | PACK | Freq: Every day | ORAL | Status: DC | PRN

## 2021-06-03 MED ORDER — DIPHENHYDRAMINE HCL 50 MG/ML IJ SOLN
25.0000 mg | Freq: Once | INTRAMUSCULAR | Status: AC
  Administered 2021-06-03: 25 mg via INTRAVENOUS
  Filled 2021-06-03: qty 1

## 2021-06-03 MED ORDER — PROCHLORPERAZINE EDISYLATE 10 MG/2ML IJ SOLN
10.0000 mg | Freq: Once | INTRAMUSCULAR | Status: AC
  Administered 2021-06-03: 10 mg via INTRAVENOUS
  Filled 2021-06-03: qty 2

## 2021-06-03 MED ORDER — ONDANSETRON HCL 4 MG PO TABS: 4.0000 mg | ORAL_TABLET | Freq: Four times a day (QID) | ORAL | Status: DC | PRN

## 2021-06-03 MED ORDER — SODIUM CHLORIDE 0.9 % IV SOLN: INTRAVENOUS | Status: DC

## 2021-06-03 MED ORDER — BISACODYL 10 MG RE SUPP: 10.0000 mg | Freq: Every day | RECTAL | Status: DC | PRN

## 2021-06-03 MED ORDER — ACETAMINOPHEN 650 MG RE SUPP: 650.0000 mg | Freq: Four times a day (QID) | RECTAL | Status: DC | PRN

## 2021-06-03 MED ORDER — HYDROMORPHONE HCL 1 MG/ML IJ SOLN
0.5000 mg | INTRAMUSCULAR | Status: DC | PRN
  Administered 2021-06-03 – 2021-06-04 (×3): 1 mg via INTRAVENOUS
  Filled 2021-06-03 (×3): qty 1

## 2021-06-03 MED ORDER — ONDANSETRON HCL 4 MG/2ML IJ SOLN: 4.0000 mg | Freq: Four times a day (QID) | INTRAMUSCULAR | Status: DC | PRN

## 2021-06-03 MED ORDER — ACETAMINOPHEN 325 MG PO TABS
650.0000 mg | ORAL_TABLET | Freq: Four times a day (QID) | ORAL | Status: DC | PRN
  Administered 2021-06-05: 650 mg via ORAL
  Filled 2021-06-03: qty 2

## 2021-06-03 MED ORDER — ONDANSETRON HCL 4 MG/2ML IJ SOLN
4.0000 mg | Freq: Once | INTRAMUSCULAR | Status: AC
  Administered 2021-06-03: 4 mg via INTRAVENOUS
  Filled 2021-06-03: qty 2

## 2021-06-03 MED ORDER — FLEET ENEMA 7-19 GM/118ML RE ENEM: 1.0000 | ENEMA | Freq: Once | RECTAL | Status: DC | PRN

## 2021-06-03 MED ORDER — OXYCODONE HCL 5 MG PO TABS
5.0000 mg | ORAL_TABLET | ORAL | Status: DC | PRN
  Administered 2021-06-03 – 2021-06-04 (×2): 5 mg via ORAL
  Filled 2021-06-03 (×2): qty 1

## 2021-06-03 NOTE — ED Provider Notes (Signed)
[facility] EMERGENCY DEPARTMENT Provider Note   CSN: [id number] Arrival date & time: 06/03/21  0133     History Chief Complaint  Patient presents with   Headache    Brooke Carrillo is a 35 y.o. 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  The patient reports that she has had daily episodes of nausea and vomiting since she was seen in the emergency department on August 23 and had CSF fluid drained from her previous surgical site.  Admission was offered at that time, but patient declined.  She reports multiple episodes of nonbloody, nonbilious vomiting daily.  She also reports constant, worsening pressure-like headache, especially at the top of her head.  She rates headache as 10 out of 10 and reports that it is becoming unbearable.  She reports that she has also noticed swelling to the back of her neck from her surgical incision site.  Reports that she was previously told by Dr. Johnsie Cancel that she may require a shunt if the fluid collection returned.  Reports that she was supposed to follow-up in the office, but has not followed up "because she isn't really good at following up for post-op visits." She denies visual changes, numbness, weakness, fever, chills, neck stiffness, rash, chest pain, dizziness, lightheadedness, seizures, syncope, shortness of breath, abdominal pain, dysuria, or other associated symptoms.  The history is provided by the patient and medical records. No language interpreter was used.      Past Medical History:  Diagnosis Date   Headache    Pregnancy as incidental finding     Patient Active Problem List   Diagnosis Date Noted   Chiari malformation type I (HCC) 04/08/2021   Chest pain of uncertain etiology 11/08/2020   Palpitations  11/08/2020   Acute cholecystitis 08/27/2020   Sinus bradycardia 08/27/2020   Postpartum care and examination 06/01/2018   Unwanted fertility 06/01/2018   Contraception management 06/01/2018   Vitamin D deficiency 10/21/2017   Tobacco abuse 10/13/2017    Past Surgical History:  Procedure Laterality Date   CHOLECYSTECTOMY N/A 08/27/2020   Procedure: LAPAROSCOPIC CHOLECYSTECTOMY;  Surgeon: [name], MD;  Location: [facility] OR;  Service: General;  Laterality: N/A;   LAPAROSCOPIC TUBAL LIGATION Bilateral 07/23/2018   Procedure: LAPAROSCOPIC TUBAL LIGATION;  Surgeon: Hermina Staggers, MD;  Location: [facility] ORS;  Service: Gynecology;  Laterality: Bilateral;   ORIF FINGER / THUMB FRACTURE     SUBOCCIPITAL CRANIECTOMY CERVICAL LAMINECTOMY N/A 04/08/2021   Procedure: Suboccipital craniectomy for chiari decompression;  Surgeon: Jadene Pierini, MD;  Location: [facility] OR;  Service: Neurosurgery;  Laterality: N/A;   WISDOM TOOTH EXTRACTION       OB History     Gravida  8   Para  4   Term  4   Preterm  0   AB  4   Living  4      SAB  3   IAB  1   Ectopic  0   Multiple  0   Live Births  4           Family History  Problem Relation Age of Onset   Hypertension Father    Stroke Father    Heart disease Father    Dementia Mother    Cancer Maternal Aunt  Breast   HIV Maternal Aunt    Stroke Maternal Uncle    Cancer Paternal Uncle        Colon   Cancer Maternal Grandmother    Hypertension Paternal Grandmother    Cancer Paternal Grandfather        Colon    Social History   Tobacco Use   Smoking status: Every Day    Packs/day: 0.50    Types: Cigarettes   Smokeless tobacco: Never  Vaping Use   Vaping Use: Former  Substance Use Topics   Alcohol use: No   Drug use: Yes    Types: Heroin    Comment: "started a week ago"    Home Medications Prior to Admission medications   Medication Sig Start Date End Date Taking? Authorizing Provider  acetaminophen  (TYLENOL) 325 MG tablet Take 650 mg by mouth every 6 (six) hours as needed for moderate pain or headache.   Yes [provider]  cyclobenzaprine (FLEXERIL) 10 MG tablet Take 1 tablet (10 mg total) by mouth 3 (three) times daily as needed for muscle spasms. 05/02/21  Yes Alvira Monday, MD  ibuprofen (ADVIL) 600 MG tablet Take 600 mg by mouth every 6 (six) hours as needed for headache or moderate pain.   Yes [provider]  prochlorperazine (COMPAZINE) 10 MG tablet Take 1 tablet (10 mg total) by mouth 2 (two) times daily as needed for nausea or vomiting (headache). 05/02/21  Yes Alvira Monday, MD  methylPREDNISolone (MEDROL DOSEPAK) 4 MG TBPK tablet Take 1 tablet (4 mg total) by mouth as directed. Patient not taking: Reported on 06/03/2021 05/02/21   Alvira Monday, MD  oxyCODONE (OXY IR/ROXICODONE) 5 MG immediate release tablet Take 1 tablet (5 mg total) by mouth every 4 (four) hours as needed for moderate pain ((score 4 to 6)). Patient not taking: No sig reported 04/10/21   Jadene Pierini, MD  oxyCODONE (ROXICODONE) 5 MG immediate release tablet Take 1 tablet (5 mg total) by mouth every 4 (four) hours as needed for severe pain. Patient not taking: Reported on 06/03/2021 05/02/21   Alvira Monday, MD    Allergies    Patient has no known allergies.  Review of Systems   Review of Systems  Constitutional:  Negative for activity change, chills, fatigue and fever.  HENT:  Negative for congestion and sore throat.   Respiratory:  Negative for cough, shortness of breath and wheezing.   Cardiovascular:  Negative for chest pain and palpitations.  Gastrointestinal:  Positive for nausea and vomiting. Negative for abdominal pain, constipation and diarrhea.  Genitourinary:  Negative for dysuria.  Musculoskeletal:  Negative for back pain, joint swelling, myalgias, neck pain and neck stiffness.       Neck swelling  Skin:  Negative for rash and wound.  Allergic/Immunologic: Negative for  immunocompromised state.  Neurological:  Positive for headaches. Negative for dizziness, seizures, syncope, speech difficulty, weakness, light-headedness and numbness.  Psychiatric/Behavioral:  Negative for confusion.    Physical Exam Updated Vital Signs BP 107/81   Pulse 70   Temp 97.9 F (36.6 C)   Resp 10   LMP  (LMP Unknown)   SpO2 95%   Physical Exam Vitals and nursing note reviewed.  Constitutional:      General: She is not in acute distress.    Appearance: She is not ill-appearing, toxic-appearing or diaphoretic.  HENT:     Head: Normocephalic.  Eyes:     Conjunctiva/sclera: Conjunctivae normal.  Cardiovascular:  Rate and Rhythm: Normal rate and regular rhythm.     Heart sounds: No murmur heard.   No friction rub. No gallop.  Pulmonary:     Effort: Pulmonary effort is normal. No respiratory distress.  Abdominal:     General: There is no distension.     Palpations: Abdomen is soft.  Musculoskeletal:     Cervical back: Neck supple.  Skin:    General: Skin is warm.     Findings: No rash.  Neurological:     Mental Status: She is alert.     Comments: Mental Status: Patient is awake, alert, oriented to person, place, month, year, and situation. Patient is able to give a clear and coherent history. Cranial Nerves: II: Visual Fields are full. Pupils are equal, round, and reactive to light. III,IV, VI: EOMI without ptosis or diploplia.  V: Facial sensation is symmetric to temperature VII: Facial movement is symmetric.  VIII: hearing is intact to voice X: Uvula elevates symmetrically XI: Shoulder shrug is symmetric. XII: tongue is midline without atrophy or fasciculations.  Motor: Tone is normal. Bulk is normal. 5/5 strength was present in all four extremities.  Sensory: Sensation is symmetric to light touch and temperature in the arms and legs. Cerebellar: FNF intact bilaterally    Psychiatric:        Behavior: Behavior normal.     ED Results /  Procedures / Treatments   Labs (all labs ordered are listed, but only abnormal results are displayed) Labs Reviewed  CBC - Abnormal; Notable for the following components:      Result Value   WBC 14.3 (*)    All other components within normal limits  BASIC METABOLIC PANEL - Abnormal; Notable for the following components:   Glucose, Bld 119 (*)    Calcium 11.6 (*)    All other components within normal limits  RESP PANEL BY RT-PCR (FLU A&B, COVID) ARPGX2  I-STAT BETA HCG BLOOD, ED ([facility], [facility], [facility] ONLY)    EKG None  Radiology CT HEAD WO CONTRAST (5MM)  Result Date: 06/03/2021 CLINICAL DATA:  Nausea, vomiting, progressive headache. Status post suboccipital decompression EXAM: CT HEAD WITHOUT CONTRAST TECHNIQUE: Contiguous axial images were obtained from the base of the skull through the vertex without intravenous contrast. COMPARISON:  MRI 05/21/2021 FINDINGS: Brain: Surgical changes of suboccipital craniotomy and foramen magnum decompression are again identified. 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 There is crowding of the posterior fossa structures inferiorly with increasing space subjacent to the tentorium suggesting inferior migration. Additionally, there is obliteration of the pontomesencephalic angle and increasing effacement of the prepontine cistern suggesting changes of intracranial hypotension. 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 There is significant mass effect upon the left cerebral hemisphere. There has developed 9-10 mm of left-to-right midline shift with effacement of the left lateral ventricle. No superimposed acute intracranial hemorrhage or infarct. Hypoattenuation of the brainstem and cerebellar tonsils may be artifactual in nature and  related to beam hardening at the level of the petrous bones. Vascular: No asymmetric hyperdense vasculature at the skull base. Skull: No acute fracture. Sinuses/Orbits: Orbits and paranasal sinuses are unremarkable. Other: Mastoid air cells and middle ear cavities are clear. IMPRESSION: Status post suboccipital decompression. 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 Findings in keeping with intracranial hypotension with sagging of the posterior fossa structures and loss of the pontomesencephalic space. Interval development of left subdural hygroma with mass effect upon the left cerebral hemisphere and approximately 9-10 mm left-to-right midline shift. Attempts are being made to contact the managing clinician at this time for direct verbal communication of these findings. Electronically Signed   By: Helyn Numbers M.D.   On: 06/03/2021 03:49    Procedures Procedures   Medications Ordered in ED Medications  ondansetron (ZOFRAN) injection 4 mg (4 mg Intravenous Given 06/03/21 0339)  prochlorperazine (COMPAZINE) injection 10 mg (10 mg Intravenous Given 06/03/21 0339)  diphenhydrAMINE (BENADRYL) injection 25 mg (25 mg Intravenous Given 06/03/21 6010)    ED Course  I have reviewed the triage vital signs and the nursing notes.  Pertinent labs & imaging results that were available during my care of the patient were reviewed by me and considered in my medical decision making (see chart for details).  Clinical Course as of 06/03/21 0700  Mon Jun 03, 2021  9323 Spoke with APP Doran Durand, neurosurgery, he will come and evaluate the patient. 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  No constitutional symptoms.  Vital signs are stable.  No focal neurologic deficits.  The patient was discussed with Dr. Anitra Lauth, attending physician.  Labs and imaging been reviewed and independently interpreted by me.  Leukocytosis of unknown significance at 14.  No metabolic derangements secondary to daily vomiting.  COVID-19 test is negative.  Pregnancy test is negative.  CT with enlarging pseudomeningocele with bulging of the posterior soft tissues, consistent with CSF leak.  There is an interval development of L left subdural hygroma with mass-effect upon the left cerebral hemisphere, approximately 9 to 10 mm left to right midline shift.  Discussed the patient with APP [facility] with neurosurgery is coming to evaluate the patient.  Patient was given Compazine Benadryl and has had improved nausea and headache.  Patient care transferred to PA Sponseller at the end of my shift to follow-up on neurosurgery consult. Patient presentation, ED course, and plan of care discussed with review of all pertinent labs and imaging. Please see his/her note for further details regarding further ED course and disposition.  Final Clinical Impression(s) / ED Diagnoses Final diagnoses:  None    Rx / DC Orders ED Discharge Orders     None        Nairi Oswald A, PA-C 06/03/21 0701    Gwyneth Sprout, MD 06/06/21 1154

## 2021-06-03 NOTE — ED Notes (Signed)
Pt reports mild nausea and pain at 5/10 head pressure. Pt refused pain meds and nausea at this time. Pt states she will notify RN if she needs meds. Remains alert and oriented x 4.

## 2021-06-03 NOTE — ED Triage Notes (Signed)
Pt had Chiari malformation surgery on 04/08/21. Reports having buildup of fluid at incision site that was drained on last visit. Pt c/o nausea and worsening headache

## 2021-06-03 NOTE — ED Provider Notes (Signed)
  Physical Exam  BP 113/82   Pulse 64   Temp 97.9 F (36.6 C)   Resp 12   LMP  (LMP Unknown)   SpO2 95%   Physical Exam  ED Course/Procedures   Clinical Course as of 06/03/21 0827  Mon Jun 03, 2021  5427 Spoke with APP Doran Durand, neurosurgery, he will come and evaluate the patient. [MM]  [phone] Per patient, neurosurgery APP has been to the bedside, stated he was awaiting disposition plan from Dr. Maurice Small. I have repaged neurosurgery at this time.  [RS]  [phone] Consult to neurosurgery NP, Docia Barrier, who states patient will be admitted to neurosurgical service.  Awaiting consult by neurosurgeon Dr. Johnsie Cancel for definitive treatment plan for patient's pseudomeningocele, however will require close inpatient observation regardless.  Appreciate his collaboration in the care of this patient. Temporary admission orders placed by this provider.  [RS]    Clinical Course User Index [MM] McDonald, Mia A, PA-C [RS] Brooke Carrillo, Eugene Gavia, PA-C   Procedures  MDM   Care of this patient assumed from preceding ED provider Mia McDonald, PA-C at time of shift change.  Please see her associated note for further insight into the patient's ED course.  In brief, patient is a 35 year old female status post suboccipital craniectomy for Chiari I decompression on 7/11, who who presents with daily episodes of nausea and vomiting since she was seen in the emergency department 8/23, at which time she was diagnosed with pseudomeningocele .  This was drained at the bedside by neurosurgery in the emergency department and patient was discharged home with next day outpatient neurosurgical follow-up.  She presents now with daily nausea and vomiting as well as worsening headache.  While laboratory studies are reassuring in the ED today, her CT of the head did reveal a 9 to 10 mm left to right midline shift with effacement of the lateral ventricle secondary to enlarging pseudomeningocele, consistent with CSF  leak.  Patient administered headache cocktail with improvement in her symptoms; at time of shift change, patient is awaiting bedside neurosurgical consultation.  Preceding ED provider did speak with on the phone several hours ago, however patient has not been evaluated at the bedside by neurosurgical team.  Patient to be admitted to neurosurgical progressive unit, per neurosurgery NP, Arelia Longest. Temporary admission orders placed by this provider. Patient reevaluated with significant improvement in her headache.   Swan voiced understanding of her medical evaluation and treatment plan. Each of her questions was answered to her expressed satisfaction. She is amenable to admission at this time.   This chart was dictated using voice recognition software, Dragon. Despite the best efforts of this provider to proofread and correct errors, errors may still occur which can change documentation meaning.    Brooke Lore, PA-C 06/03/21 0831    Brooke Distance, MD 06/03/21 [phone]

## 2021-06-03 NOTE — Progress Notes (Addendum)
Called MD, patient continues to report a headache unrelieved by current pain regimen.   Pt states that the headache cocktail she received in the ED is the only thing that has seemed to help her.  Awaiting MD response.

## 2021-06-03 NOTE — H&P (Signed)
Chief Complaint: Headache, N/V, pseudomeningocele  HPI: Brooke Carrillo is an 35 y.o. female who is s/p suboccipital craniectomy for Chiari I decompression on 7/11 by Dr. Maurice Smallstergard. 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 She was evaluated by Dr. Maurice Smallstergard and ultimately had the pseudomeningocele drained at bedside with sample sent with no bacterial growth. She was then discharged home with instructions for outpatient follow up. She has yet to follow up in the outpatient setting.  Today, she continues to have complaints of headaches and N/V that she feels is progressively worsening, but also has had a reoccurrence of the posterior pseudomeningocele. Her headaches are reported to up to a 10/10 pressure-like headache, especially at the top of her head. Patient administered headache cocktail while in the ED with improvement in her headache symptoms. Headache also improves with lying flat. She denies visual changes, numbness, weakness, fever, chills, neck stiffness, rash, chest pain, dizziness, lightheadedness, seizures, syncope, shortness of breath, abdominal pain, dysuria, or other associated symptoms.  Past Medical History:  Diagnosis Date   Headache    Pregnancy as incidental finding     Past Surgical History:  Procedure Laterality Date   CHOLECYSTECTOMY N/A 08/27/2020   Procedure: LAPAROSCOPIC CHOLECYSTECTOMY;  Surgeon: Emelia LoronWakefield, Matthew, MD;  [facility];  Service: General;  Laterality: N/A;   LAPAROSCOPIC TUBAL LIGATION Bilateral 07/23/2018   Procedure: LAPAROSCOPIC TUBAL LIGATION;  Surgeon: Hermina StaggersErvin, Michael L, MD;  Location: [facility] ORS;  Service: Gynecology;  Laterality: Bilateral;   ORIF FINGER / THUMB FRACTURE     SUBOCCIPITAL CRANIECTOMY CERVICAL LAMINECTOMY N/A 04/08/2021   Procedure: Suboccipital craniectomy for chiari decompression;  Surgeon: Jadene Pierinistergard, Thomas A, MD;  [facility];   Service: Neurosurgery;  Laterality: N/A;   WISDOM TOOTH EXTRACTION      Family History  Problem Relation Age of Onset   Hypertension Father    Stroke Father    Heart disease Father    Dementia Mother    Cancer Maternal Aunt        Breast   HIV Maternal Aunt    Stroke Maternal Uncle    Cancer Paternal Uncle        Colon   Cancer Maternal Grandmother    Hypertension Paternal Grandmother    Cancer Paternal Grandfather        Colon   Social History:  reports that she has been smoking cigarettes. She has been smoking an average of .5 packs per day. She has never used smokeless tobacco. 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 She reports that she does not drink alcohol.  Allergies: No Known Allergies  (Not in a hospital admission)   Results for orders placed or performed during the hospital encounter of 06/03/21 (from the past 48 hour(s))  CBC     Status: Abnormal   Collection Time: 06/03/21  2:44 AM  Result Value Ref Range   WBC 14.3 (H) 4.0 - 10.5 K/uL   RBC 4.53 3.87 - 5.11 MIL/uL   Hemoglobin 14.8 12.0 - 15.0 g/dL   HCT 47.843.1 29.536.0 - 62.146.0 %   MCV 95.1 80.0 - 100.0 fL   MCH 32.7 26.0 - 34.0 pg   MCHC 34.3 30.0 - 36.0 g/dL   RDW 30.812.5 65.711.5 - 84.615.5 %   Platelets 343 150 - 400 K/uL   nRBC 0.0 0.0 - 0.2 %    Comment: Performed at [facility], [address] [place], [place] [address]  Resp Panel by RT-PCR (Flu A&B, Covid) Nasopharyngeal Swab     Status: None   Collection Time: 06/03/21  2:44 AM   Specimen: Nasopharyngeal Swab; Nasopharyngeal(NP) swabs in vial transport medium  Result Value Ref Range   SARS Coronavirus 2 by RT PCR NEGATIVE NEGATIVE    Comment: (NOTE) SARS-CoV-2 target nucleic acids are NOT DETECTED.  The SARS-CoV-2 RNA is generally detectable in upper respiratory specimens during the acute phase of infection. The lowest concentration of SARS-CoV-2 viral copies this assay can detect is 138 copies/mL. A negative result does not preclude  SARS-Cov-2 infection and should not be used as the sole basis for treatment or other patient management decisions. A negative result may occur with  improper specimen collection/handling, submission of specimen other than nasopharyngeal swab, presence of viral mutation(s) within the areas targeted by this assay, and inadequate number of viral copies(<138 copies/mL). A negative result must be combined with clinical observations, patient history, and epidemiological information. The expected result is Negative.  Fact Sheet for Patients:  BloggerCourse.com  Fact Sheet for Healthcare Providers:  SeriousBroker.it  This test is no t yet approved or cleared by the [place] FDA and  has been authorized for detection and/or diagnosis of SARS-CoV-2 by FDA under an Emergency Use Authorization (EUA). This EUA will remain  in effect (meaning this test can be used) for the duration of the COVID-19 declaration under Section 564(b)(1) of the Act, 21 U.S.C.section 360bbb-3(b)(1), unless the authorization is terminated  or revoked sooner.       Influenza A by PCR NEGATIVE NEGATIVE   Influenza B by PCR NEGATIVE NEGATIVE    Comment: (NOTE) The Xpert Xpress SARS-CoV-2/FLU/RSV plus assay is intended as an aid in the diagnosis of influenza from Nasopharyngeal swab specimens and should not be used as a sole basis for treatment. Nasal washings and aspirates are unacceptable for Xpert Xpress SARS-CoV-2/FLU/RSV testing.  Fact Sheet for Patients: BloggerCourse.com  Fact Sheet for Healthcare Providers: SeriousBroker.it  This test is not yet approved or cleared by the [place] FDA and has been authorized for detection and/or diagnosis of SARS-CoV-2 by FDA under an Emergency Use Authorization (EUA). This EUA will remain in effect (meaning this test can be used) for the duration of the COVID-19  declaration under Section 564(b)(1) of the Act, 21 U.S.C. section 360bbb-3(b)(1), unless the authorization is terminated or revoked.  Performed at [facility], [address], [place], [place] [address]   Basic metabolic panel     Status: Abnormal   Collection Time: 06/03/21  2:44 AM  Result Value Ref Range   Sodium 142 135 - 145 mmol/L   Potassium 3.5 3.5 - 5.1 mmol/L   Chloride 100 98 - 111 mmol/L   CO2 31 22 - 32 mmol/L   Glucose, Bld 119 (H) 70 - 99 mg/dL    Comment: Glucose reference range applies only to samples taken after fasting for at least 8 hours.   BUN 9 6 - 20 mg/dL   Creatinine, Ser 6.80 0.44 - 1.00 mg/dL   Calcium 32.1 (H) 8.9 - 10.3 mg/dL   GFR, Estimated >22 >48 mL/min    Comment: (NOTE) Calculated using the CKD-EPI Creatinine Equation (2021)    Anion gap 11 5 - 15    Comment: Performed at [facility], [address], [place], [place] [address]  I-Stat Beta hCG blood, ED ([facility], [facility], [facility] only)     Status: None  Collection Time: 06/03/21  3:13 AM  Result Value Ref Range   I-stat hCG, quantitative <5.0 <5 mIU/mL   Comment 3            Comment:   GEST. AGE      CONC.  (mIU/mL)   <=1 WEEK        5 - 50     2 WEEKS       50 - 500     3 WEEKS       100 - 10,000     4 WEEKS     1,000 - 30,000        FEMALE AND NON-PREGNANT FEMALE:     LESS THAN 5 mIU/mL    CT HEAD WO CONTRAST ( )  Result Date: 06/03/2021 CLINICAL DATA:  Nausea, vomiting, progressive headache. Status post suboccipital decompression EXAM: CT HEAD WITHOUT CONTRAST TECHNIQUE: Contiguous axial images were obtained from the base of the skull through the vertex without intravenous contrast. COMPARISON:  MRI 05/21/2021 FINDINGS: Brain: Surgical changes of suboccipital craniotomy and foramen magnum decompression are again identified. A pseudomeningocele is again identified posterior to the a resection bed and, while incompletely included on this examination, appears increased in size with bulging  of the posterior soft tissues. There is crowding of the posterior fossa structures inferiorly with increasing space subjacent to the tentorium suggesting inferior migration. Additionally, there is obliteration of the pontomesencephalic angle and increasing effacement of the prepontine cistern suggesting changes of intracranial hypotension. 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 There is significant mass effect upon the left cerebral hemisphere. There has developed 9-10 mm of left-to-right midline shift with effacement of the left lateral ventricle. No superimposed acute intracranial hemorrhage or infarct. Hypoattenuation of the brainstem and cerebellar tonsils may be artifactual in nature and related to beam hardening at the level of the petrous bones. Vascular: No asymmetric hyperdense vasculature at the skull base. Skull: No acute fracture. Sinuses/Orbits: Orbits and paranasal sinuses are unremarkable. Other: Mastoid air cells and middle ear cavities are clear. IMPRESSION: Status post suboccipital decompression. Enlarging pseudomeningocele with bulging of the posterior soft tissues in keeping with CSF leak. Findings in keeping with intracranial hypotension with sagging of the posterior fossa structures and loss of the pontomesencephalic space. Interval development of left subdural hygroma with mass effect upon the left cerebral hemisphere and approximately 9-10 mm left-to-right midline shift. Attempts are being made to contact the managing clinician at this time for direct verbal communication of these findings. Electronically Signed   By: Helyn Numbers M.D.   On: 06/03/2021 03:49    ROS: Per HPI  Blood pressure 113/82, pulse 64, temperature 97.9 F (36.6 C), resp. rate 12, SpO2 95 %.  Physical Exam:  General: Awake, alert, cooperative, lying in bed in NAD Head: Normocephalic and atruamatic HEENT: Neck  supple Pulmonary: breathing room air comfortably, no evidence of increased work of breathing Cardiac: RRR Abdomen: S NT ND Extremities: Warm and well perfused x4 Neuro: A/O X 4, conversant, and in good spirits. They are in NAD and VSS. Doing well. Speech is fluent and appropriate. MAEW with good strength that is symmetric bilaterally. Sensation to light touch is intact. PERLA, EOMI. CNs grossly intact. Incision well healed but large full pseudomeningocele in the suboccipital midline      Assessment/Plan 35 y.o. female who is s/p chiari decompression w/ continued pseudomeningocele, headache, and N/V. MRI reviewed, which shows a large  pseudomeningocele with bulging of the posterior soft tissues and left subdural hygroma with mass effect upon the left cerebral hemisphere and approximately 9-10 mm left-to-right midline shift. Will admit patient to neuro progressive care for close neurological monitoring. No acute neurosurgical intervention is indicated at this time. Case will be discussed with Dr. Maurice Small for possible surgical exploration.   -Admit to neuro progressive care -Frequent neuro checks -Pain control -Antiemetics -DVT ppx  Council Mechanic, DNP, NP-C 06/03/2021 8:53 AM

## 2021-06-03 NOTE — ED Notes (Signed)
Pt refused pain meds at this time. Pt states she will call RN if pain increases. Appears comfortable on bed at this time. Remains alert and oriented x 4. Cardiac monitoring in place. Will continue to monitor.

## 2021-06-04 ENCOUNTER — Inpatient Hospital Stay (HOSPITAL_COMMUNITY): Payer: Medicaid Other

## 2021-06-04 ENCOUNTER — Inpatient Hospital Stay (HOSPITAL_COMMUNITY): Payer: Medicaid Other | Admitting: Anesthesiology

## 2021-06-04 ENCOUNTER — Encounter (HOSPITAL_COMMUNITY): Payer: Self-pay | Admitting: Neurological Surgery

## 2021-06-04 ENCOUNTER — Encounter (HOSPITAL_COMMUNITY)
Admission: EM | Disposition: A | Discharge status: Home / Self Care | Payer: Self-pay | Source: Home / Self Care | Attending: Neurological Surgery

## 2021-06-04 HISTORY — PX: APPLICATION OF CRANIAL NAVIGATION: SHX6578

## 2021-06-04 HISTORY — PX: VENTRICULOPERITONEAL SHUNT: SHX204

## 2021-06-04 LAB — MRSA NEXT GEN BY PCR, NASAL: MRSA by PCR Next Gen: NOT DETECTED

## 2021-06-04 SURGERY — SHUNT INSERTION VENTRICULAR-PERITONEAL
Anesthesia: General | Site: Head | Laterality: Right

## 2021-06-04 MED ORDER — DEXAMETHASONE SODIUM PHOSPHATE 10 MG/ML IJ SOLN
INTRAMUSCULAR | Status: AC
  Filled 2021-06-04: qty 1

## 2021-06-04 MED ORDER — HYDROMORPHONE HCL 1 MG/ML IJ SOLN
0.2500 mg | INTRAMUSCULAR | Status: DC | PRN
  Administered 2021-06-04 (×2): 0.25 mg via INTRAVENOUS

## 2021-06-04 MED ORDER — CEFAZOLIN SODIUM-DEXTROSE 2-3 GM-%(50ML) IV SOLR
INTRAVENOUS | Status: DC | PRN
  Administered 2021-06-04: 2 g via INTRAVENOUS

## 2021-06-04 MED ORDER — OXYCODONE HCL 5 MG PO TABS
10.0000 mg | ORAL_TABLET | ORAL | Status: DC | PRN
  Administered 2021-06-04 – 2021-06-05 (×3): 10 mg via ORAL
  Filled 2021-06-04 (×3): qty 2

## 2021-06-04 MED ORDER — OXYCODONE HCL 5 MG/5ML PO SOLN: 5.0000 mg | Freq: Once | ORAL | Status: DC | PRN

## 2021-06-04 MED ORDER — DEXAMETHASONE SODIUM PHOSPHATE 10 MG/ML IJ SOLN
INTRAMUSCULAR | Status: DC | PRN
  Administered 2021-06-04: 10 mg via INTRAVENOUS

## 2021-06-04 MED ORDER — HYDROMORPHONE HCL 1 MG/ML IJ SOLN: 0.5000 mg | INTRAMUSCULAR | Status: DC | PRN

## 2021-06-04 MED ORDER — FENTANYL CITRATE (PF) 250 MCG/5ML IJ SOLN
INTRAMUSCULAR | Status: AC
  Filled 2021-06-04: qty 5

## 2021-06-04 MED ORDER — MIDAZOLAM HCL 2 MG/2ML IJ SOLN
INTRAMUSCULAR | Status: DC | PRN
  Administered 2021-06-04: 2 mg via INTRAVENOUS

## 2021-06-04 MED ORDER — SUGAMMADEX SODIUM 200 MG/2ML IV SOLN
INTRAVENOUS | Status: DC | PRN
Start: 2021-06-04 — End: 2021-06-04
  Administered 2021-06-04: 160 mg via INTRAVENOUS

## 2021-06-04 MED ORDER — ROCURONIUM BROMIDE 10 MG/ML (PF) SYRINGE
PREFILLED_SYRINGE | INTRAVENOUS | Status: DC | PRN
  Administered 2021-06-04: 30 mg via INTRAVENOUS
  Administered 2021-06-04: 40 mg via INTRAVENOUS

## 2021-06-04 MED ORDER — HYDROMORPHONE HCL 1 MG/ML IJ SOLN
INTRAMUSCULAR | Status: AC
  Filled 2021-06-04: qty 1

## 2021-06-04 MED ORDER — ROCURONIUM BROMIDE 10 MG/ML (PF) SYRINGE
PREFILLED_SYRINGE | INTRAVENOUS | Status: AC
  Filled 2021-06-04: qty 10

## 2021-06-04 MED ORDER — DEXMEDETOMIDINE (PRECEDEX) IN NS 20 MCG/5ML (4 MCG/ML) IV SYRINGE
PREFILLED_SYRINGE | INTRAVENOUS | Status: DC | PRN
  Administered 2021-06-04: 8 ug via INTRAVENOUS
  Administered 2021-06-04: 12 ug via INTRAVENOUS

## 2021-06-04 MED ORDER — PHENYLEPHRINE 40 MCG/ML (10ML) SYRINGE FOR IV PUSH (FOR BLOOD PRESSURE SUPPORT)
PREFILLED_SYRINGE | INTRAVENOUS | Status: AC
  Filled 2021-06-04: qty 10

## 2021-06-04 MED ORDER — LIDOCAINE 2% (20 MG/ML) 5 ML SYRINGE
INTRAMUSCULAR | Status: DC | PRN
  Administered 2021-06-04: 60 mg via INTRAVENOUS
  Administered 2021-06-04: 40 mg via INTRAVENOUS

## 2021-06-04 MED ORDER — AMISULPRIDE (ANTIEMETIC) 5 MG/2ML IV SOLN: 10.0000 mg | Freq: Once | INTRAVENOUS | Status: DC | PRN

## 2021-06-04 MED ORDER — OXYCODONE HCL 5 MG PO TABS: 5.0000 mg | ORAL_TABLET | Freq: Once | ORAL | Status: DC | PRN

## 2021-06-04 MED ORDER — 0.9 % SODIUM CHLORIDE (POUR BTL) OPTIME
TOPICAL | Status: DC | PRN
  Administered 2021-06-04: 1000 mL

## 2021-06-04 MED ORDER — ORAL CARE MOUTH RINSE: 15.0000 mL | Freq: Once | OROMUCOSAL | Status: AC

## 2021-06-04 MED ORDER — LACTATED RINGERS IV SOLN: INTRAVENOUS | Status: DC | PRN

## 2021-06-04 MED ORDER — MIDAZOLAM HCL 2 MG/2ML IJ SOLN
INTRAMUSCULAR | Status: AC
  Filled 2021-06-04: qty 2

## 2021-06-04 MED ORDER — DIPHENHYDRAMINE HCL 50 MG/ML IJ SOLN
INTRAMUSCULAR | Status: DC | PRN
  Administered 2021-06-04: 12.5 mg via INTRAVENOUS

## 2021-06-04 MED ORDER — FENTANYL CITRATE (PF) 250 MCG/5ML IJ SOLN
INTRAMUSCULAR | Status: DC | PRN
  Administered 2021-06-04: 50 ug via INTRAVENOUS
  Administered 2021-06-04: 150 ug via INTRAVENOUS
  Administered 2021-06-04 (×2): 50 ug via INTRAVENOUS
  Administered 2021-06-04: 100 ug via INTRAVENOUS

## 2021-06-04 MED ORDER — DEXMEDETOMIDINE (PRECEDEX) IN NS 20 MCG/5ML (4 MCG/ML) IV SYRINGE
PREFILLED_SYRINGE | INTRAVENOUS | Status: AC
  Filled 2021-06-04: qty 5

## 2021-06-04 MED ORDER — BACITRACIN ZINC 500 UNIT/GM EX OINT
TOPICAL_OINTMENT | CUTANEOUS | Status: AC
  Filled 2021-06-04: qty 28.35

## 2021-06-04 MED ORDER — CHLORHEXIDINE GLUCONATE 0.12 % MT SOLN
OROMUCOSAL | Status: AC
  Administered 2021-06-04: 15 mL via OROMUCOSAL
  Filled 2021-06-04: qty 15

## 2021-06-04 MED ORDER — PROPOFOL 10 MG/ML IV BOLUS
INTRAVENOUS | Status: AC
  Filled 2021-06-04: qty 20

## 2021-06-04 MED ORDER — ESMOLOL HCL 100 MG/10ML IV SOLN
INTRAVENOUS | Status: DC | PRN
  Administered 2021-06-04 (×2): 50 mg via INTRAVENOUS

## 2021-06-04 MED ORDER — DIPHENHYDRAMINE HCL 50 MG/ML IJ SOLN
INTRAMUSCULAR | Status: AC
  Filled 2021-06-04: qty 1

## 2021-06-04 MED ORDER — LIDOCAINE-EPINEPHRINE 2 %-1:100000 IJ SOLN
INTRAMUSCULAR | Status: AC
  Filled 2021-06-04: qty 1

## 2021-06-04 MED ORDER — BACITRACIN ZINC 500 UNIT/GM EX OINT
TOPICAL_OINTMENT | CUTANEOUS | Status: DC | PRN
  Administered 2021-06-04 (×2): 1 via TOPICAL

## 2021-06-04 MED ORDER — ONDANSETRON HCL 4 MG/2ML IJ SOLN
INTRAMUSCULAR | Status: DC | PRN
  Administered 2021-06-04: 4 mg via INTRAVENOUS

## 2021-06-04 MED ORDER — ESMOLOL HCL 100 MG/10ML IV SOLN
INTRAVENOUS | Status: AC
  Filled 2021-06-04: qty 10

## 2021-06-04 MED ORDER — PROPOFOL 10 MG/ML IV BOLUS
INTRAVENOUS | Status: DC | PRN
  Administered 2021-06-04: 200 mg via INTRAVENOUS

## 2021-06-04 MED ORDER — LIDOCAINE-EPINEPHRINE 2 %-1:100000 IJ SOLN
INTRAMUSCULAR | Status: DC | PRN
  Administered 2021-06-04: 10 mL

## 2021-06-04 MED ORDER — CHLORHEXIDINE GLUCONATE 0.12 % MT SOLN: 15.0000 mL | Freq: Once | OROMUCOSAL | Status: AC

## 2021-06-04 MED ORDER — ACETAMINOPHEN 10 MG/ML IV SOLN: 1000.0000 mg | Freq: Once | INTRAVENOUS | Status: DC | PRN

## 2021-06-04 MED ORDER — ONDANSETRON HCL 4 MG/2ML IJ SOLN
INTRAMUSCULAR | Status: AC
  Filled 2021-06-04: qty 2

## 2021-06-04 MED ORDER — METOCLOPRAMIDE HCL 5 MG/ML IJ SOLN
10.0000 mg | Freq: Once | INTRAMUSCULAR | Status: DC
  Filled 2021-06-04: qty 2

## 2021-06-04 MED ORDER — THROMBIN 5000 UNITS EX SOLR
CUTANEOUS | Status: AC
  Filled 2021-06-04: qty 5000

## 2021-06-04 MED ORDER — THROMBIN 5000 UNITS EX SOLR: OROMUCOSAL | Status: DC | PRN

## 2021-06-04 MED ORDER — PROMETHAZINE HCL 25 MG/ML IJ SOLN: 6.2500 mg | INTRAMUSCULAR | Status: DC | PRN

## 2021-06-04 MED ORDER — ALBUMIN HUMAN 5 % IV SOLN: INTRAVENOUS | Status: DC | PRN

## 2021-06-04 MED ORDER — DIPHENHYDRAMINE HCL 50 MG/ML IJ SOLN
12.5000 mg | Freq: Once | INTRAMUSCULAR | Status: DC
  Filled 2021-06-04: qty 1

## 2021-06-04 MED ORDER — CEFAZOLIN SODIUM-DEXTROSE 2-4 GM/100ML-% IV SOLN
INTRAVENOUS | Status: AC
  Filled 2021-06-04: qty 100

## 2021-06-04 MED ORDER — LIDOCAINE 2% (20 MG/ML) 5 ML SYRINGE
INTRAMUSCULAR | Status: AC
  Filled 2021-06-04: qty 5

## 2021-06-04 MED ORDER — SODIUM CHLORIDE 0.9 % IV SOLN: INTRAVENOUS | Status: DC

## 2021-06-04 SURGICAL SUPPLY — 56 items
ADH SKN CLS APL DERMABOND .7 (GAUZE/BANDAGES/DRESSINGS) ×2
BAG COUNTER SPONGE SURGICOUNT (BAG) ×3 IMPLANT
BAG SPNG CNTER NS LX DISP (BAG) ×2
BLADE CLIPPER SURG (BLADE) ×5 IMPLANT
BLADE SURG 11 STRL SS (BLADE) ×6 IMPLANT
BOOT SUTURE AID YELLOW STND (SUTURE) ×1 IMPLANT
BUR ACORN 9.0 PRECISION (BURR) ×3 IMPLANT
CANISTER SUCT 3000ML PPV (MISCELLANEOUS) ×3 IMPLANT
CATH PERITONEAL BACTISEAL 120 (Shunt) ×1 IMPLANT
CLIP RANEY DISP (INSTRUMENTS) IMPLANT
DECANTER SPIKE VIAL GLASS SM (MISCELLANEOUS) ×2 IMPLANT
DERMABOND ADVANCED (GAUZE/BANDAGES/DRESSINGS) ×1
DERMABOND ADVANCED .7 DNX12 (GAUZE/BANDAGES/DRESSINGS) ×2 IMPLANT
DRAPE HALF SHEET 40X57 (DRAPES) ×2 IMPLANT
DRAPE INCISE IOBAN 66X45 STRL (DRAPES) ×3 IMPLANT
DRAPE ORTHO SPLIT 77X108 STRL (DRAPES) ×6
DRAPE SURG ORHT 6 SPLT 77X108 (DRAPES) ×4 IMPLANT
DURAPREP 26ML APPLICATOR (WOUND CARE) ×5 IMPLANT
ELECT REM PT RETURN 9FT ADLT (ELECTROSURGICAL) ×3
ELECTRODE REM PT RTRN 9FT ADLT (ELECTROSURGICAL) ×2 IMPLANT
GAUZE 4X4 16PLY ~~LOC~~+RFID DBL (SPONGE) ×1 IMPLANT
GLOVE EXAM NITRILE XL STR (GLOVE) IMPLANT
GLOVE SURG LTX SZ7.5 (GLOVE) ×5 IMPLANT
GLOVE SURG UNDER LTX SZ7.5 (GLOVE) ×3 IMPLANT
GOWN STRL REUS W/ TWL LRG LVL3 (GOWN DISPOSABLE) ×4 IMPLANT
GOWN STRL REUS W/ TWL XL LVL3 (GOWN DISPOSABLE) IMPLANT
GOWN STRL REUS W/TWL 2XL LVL3 (GOWN DISPOSABLE) IMPLANT
GOWN STRL REUS W/TWL LRG LVL3 (GOWN DISPOSABLE) ×6
GOWN STRL REUS W/TWL XL LVL3 (GOWN DISPOSABLE)
HEMOSTAT POWDER KIT SURGIFOAM (HEMOSTASIS) ×3 IMPLANT
HEMOSTAT SURGICEL 2X14 (HEMOSTASIS) IMPLANT
KIT BASIN OR (CUSTOM PROCEDURE TRAY) ×3 IMPLANT
KIT TURNOVER KIT B (KITS) ×3 IMPLANT
MARKER SKIN DUAL TIP RULER LAB (MISCELLANEOUS) IMPLANT
MARKER SPHERE PSV REFLC 13MM (MARKER) ×9 IMPLANT
NEEDLE HYPO 22GX1.5 SAFETY (NEEDLE) ×3 IMPLANT
NS IRRIG 1000ML POUR BTL (IV SOLUTION) ×3 IMPLANT
PACK LAMINECTOMY NEURO (CUSTOM PROCEDURE TRAY) ×3 IMPLANT
PAD ARMBOARD 7.5X6 YLW CONV (MISCELLANEOUS) ×9 IMPLANT
PASSER CATH 65CM DISP (NEUROSURGERY SUPPLIES) ×3 IMPLANT
SHEATH PERITONEAL INTRO 61 (SHEATH) ×3 IMPLANT
SPONGE T-LAP 4X18 ~~LOC~~+RFID (SPONGE) IMPLANT
STAPLER SKIN PROX WIDE 3.9 (STAPLE) ×3 IMPLANT
STRIP CLOSURE SKIN 1/2X4 (GAUZE/BANDAGES/DRESSINGS) IMPLANT
SUT ETHILON 3 0 FSL (SUTURE) IMPLANT
SUT NURALON 4 0 TR CR/8 (SUTURE) IMPLANT
SUT SILK 0 TIES 10X30 (SUTURE) IMPLANT
SUT SILK 3 0 SH 30 (SUTURE) IMPLANT
SUT VIC AB 2-0 CP2 18 (SUTURE) ×3 IMPLANT
SUT VIC AB 3-0 SH 8-18 (SUTURE) ×6 IMPLANT
TOWEL GREEN STERILE (TOWEL DISPOSABLE) ×6 IMPLANT
TOWEL GREEN STERILE FF (TOWEL DISPOSABLE) ×3 IMPLANT
TUBE CONNECTING 12X1/4 (SUCTIONS) IMPLANT
UNDERPAD 30X36 HEAVY ABSORB (UNDERPADS AND DIAPERS) ×3 IMPLANT
VALVE PROGRAM CERTAS SM ANTI (Valve) ×1 IMPLANT
WATER STERILE IRR 1000ML POUR (IV SOLUTION) ×3 IMPLANT

## 2021-06-04 NOTE — Progress Notes (Signed)
Neurosurgery Service Progress Note  Subjective: No acute events overnight, still having nausea and headaches. No positional component to them today.  Objective: Vitals:   06/03/21 2339 06/04/21 0100 06/04/21 0400 06/04/21 0744  BP: 125/75  106/71 103/68  Pulse: 89 72 71 82  Resp: 18 (!) 8  14  Temp: 98.4 F (36.9 C)  98.2 F (36.8 C) 98 F (36.7 C)  TempSrc: Oral  Oral Oral  SpO2: 94% 97% 97% 94%    Physical Exam: AOx3, PERRL, EOMI, FS, TM, Strength 5/5 x4, SILTx4 Incision c/d/I w/ pseudomeningocele that's full, no obvious leakage  Assessment & Plan: 35 y.o. woman s/p chiari s/p decompression w/ pseudomeningocele s/p tap w/ no infection, recurrent headaches / vomiting, rpt CTH w/ worsening multifocal hygromas worst on the L with midline shift.  -Discussed w/ the pt at length. After I tapped her collection in the ED last month, she felt great for 2-3 days then symptoms returned. Imaging is c/f a low pressure phenomenon with the hygromas, but would not improve with drainage like that, also statistically much more likely she has post-op hydro than intracranial hypotension. If she had an idiopathic / spontaneous leak that caused her chiari, it could then cause hygromas, but it should not cause a pseudomeningocele like this. Discussed this with her, recommend VPS placement. We discussed potential issues at length aside from normal risks of surgery. This could be intracranial hypotension, it's going to be a difficult catheter placement that may require revision, and the left sided collection may eventually require drainage if it doesn't improve or worsens. I think the best option in this scenario is to place a VPS. Will also examine her incision under anesthesia, pressure on it hurts too much to check for a leak at bedside.  -volumetric CTH to use brainlab for proximal catheter placement -NPO, OR today  Jadene Pierini  06/04/21 8:58 AM

## 2021-06-04 NOTE — Op Note (Signed)
PATIENT: NIKAYA NASBY  DAY OF SURGERY: 06/04/21   PRE-OPERATIVE DIAGNOSIS:  Communicating hydrocephalus   POST-OPERATIVE DIAGNOSIS:  Same   PROCEDURE:  Right ventriculoperitoneal shunt placement, use of frameless stereotaxy   SURGEON:  Surgeon(s) and Role:    Jadene Pierini, MD - Primary      ANESTHESIA: ETGA   BRIEF HISTORY: This is a 35 year old woman in whom I previously performed a chiari decompression. She had continued headaches with nausea and vomiting and a tense pseudomeningocele. The pseudomeningocele was tapped with improvement in symptoms, CSF Cx were negative. She had recurrent symptoms, so I recommended shunt placement. Given her small ventricular size and abnormal trajectory, I opted for stereotactic navigation to assist with proximal catheter placement. We discussed this at length including risks, benefits, and alternatives and wished to proceed with surgery.   OPERATIVE DETAIL: The patient was taken to the operating room and placed on the OR table in the supine position. A formal time out was performed with two patient identifiers and confirmed the operative site. Anesthesia was induced by the anesthesia team. The patient was placed in a Mayfield head holder, the head was turned contralaterally, and the Mayfield was connected to the bed frame.   The brainlab reference array was attached to the Mayfield and, using the brainlab workstation, the patient's preoperative CT was co-registered with good fit. I opted for a right parietal trajectory, which appeared favorable over a frontal trajectory given her fairly small frontal horns.  The operative sites were marked, hair was clipped with surgical clippers, and the area was then prepped and draped in a sterile fashion along the entire length of the shunt system. A curvilinear incision was placed in the right parietal area. A burr hole was placed, dura coagulated, and a ventricular catheter was inserted using frameless stereotaxy  good flow of CSF under high pressure. It was then soft passed further to allow for some redundancy in intra-ventricular catheter length in planning for her midline shift to improve. This catheter was clamped and secured for tunneling.   Using a tunneler, the distal catheter (bactoseal) was then passed subcutaneously from the scalp incision to the previously marked abdominal incision. The abdominal incision was opened and a small laparotomy was created. The shunt system was flushed then connected to the proximal catheter with 2-0 silk ties at all connection points. After spontaneous flow was seen through the distal catheter, it was then placed intraperitoneal without resistance. With the shunt system in place and functioning, all incisions were copiously irrigated and then closed in layers. All instrument and sponge counts were correct. The patient was then returned to anesthesia for emergence. No apparent complications at the completion of the procedure.  IMPLANTS: Certas shunt valve, set to performance level 3.0; bactoseal distal catheter system   EBL:  80mL   DRAINS: none   SPECIMENS: none   Jadene Pierini, MD 06/04/21 3:22 PM

## 2021-06-04 NOTE — Plan of Care (Signed)
Pt is requiring IV pain medication to help with headache   Problem: Education: Goal: Knowledge of General Education information will improve Description: Including pain rating scale, medication(s)/side effects and non-pharmacologic comfort measures Outcome: Progressing   Problem: Health Behavior/Discharge Planning: Goal: Ability to manage health-related needs will improve Outcome: Progressing   Problem: Clinical Measurements: Goal: Ability to maintain clinical measurements within normal limits will improve Outcome: Progressing Goal: Will remain free from infection Outcome: Progressing Goal: Diagnostic test results will improve Outcome: Progressing Goal: Respiratory complications will improve Outcome: Progressing Goal: Cardiovascular complication will be avoided Outcome: Progressing   Problem: Activity: Goal: Risk for activity intolerance will decrease Outcome: Progressing   Problem: Nutrition: Goal: Adequate nutrition will be maintained Outcome: Progressing   Problem: Coping: Goal: Level of anxiety will decrease Outcome: Progressing   Problem: Elimination: Goal: Will not experience complications related to bowel motility Outcome: Progressing Goal: Will not experience complications related to urinary retention Outcome: Progressing   Problem: Safety: Goal: Ability to remain free from injury will improve Outcome: Progressing   Problem: Pain Managment: Goal: General experience of comfort will improve Outcome: Progressing   Problem: Skin Integrity: Goal: Risk for impaired skin integrity will decrease Outcome: Progressing

## 2021-06-04 NOTE — Transfer of Care (Signed)
Immediate Anesthesia Transfer of Care Note  Patient: Brooke Carrillo  Procedure(s) Performed: SHUNT INSERTION VENTRICULAR-PERITONEAL (Right: Head) APPLICATION OF CRANIAL NAVIGATION (Right)  Patient Location: PACU  Anesthesia Type:General  Level of Consciousness: awake and patient cooperative  Airway & Oxygen Therapy: Patient Spontanous Breathing and Patient connected to face mask oxygen  Post-op Assessment: Report given to RN and Post -op Vital signs reviewed and stable  Post vital signs: Reviewed and stable  Last Vitals:  Vitals Value Taken Time  BP 115/79 06/04/21 1834  Temp    Pulse 78 06/04/21 1834  Resp 14 06/04/21 1834  SpO2 100 % 06/04/21 1834  Vitals shown include unvalidated device data.  Last Pain:  Vitals:   06/04/21 1504  TempSrc: Oral  PainSc:       Patients Stated Pain Goal: 0 (06/04/21 0422)  Complications: No notable events documented.

## 2021-06-04 NOTE — Anesthesia Preprocedure Evaluation (Addendum)
Anesthesia Evaluation  Patient identified by MRN, date of birth, ID band Patient awake    Reviewed: Allergy & Precautions, NPO status , Patient's Chart, lab work & pertinent test results  Airway Mallampati: II  TM Distance: >3 FB Neck ROM: Full    Dental no notable dental hx.    Pulmonary Current Smoker and Patient abstained from smoking.,    Pulmonary exam normal breath sounds clear to auscultation       Cardiovascular negative cardio ROS Normal cardiovascular exam Rhythm:Regular Rate:Normal  ECG: NSR, rate 86   Neuro/Psych  Headaches, negative psych ROS   GI/Hepatic negative GI ROS, (+)     substance abuse  IV drug use,   Endo/Other  negative endocrine ROS  Renal/GU negative Renal ROS     Musculoskeletal negative musculoskeletal ROS (+) narcotic dependent  Abdominal (+) + obese,   Peds  Hematology negative hematology ROS (+)   Anesthesia Other Findings Hydrocephalus CSF Leak  Reproductive/Obstetrics hcg negative                            Anesthesia Physical Anesthesia Plan  ASA: 2  Anesthesia Plan: General   Post-op Pain Management:    Induction: Intravenous  PONV Risk Score and Plan: 2 and Ondansetron, Dexamethasone and Treatment may vary due to age or medical condition  Airway Management Planned: Oral ETT  Additional Equipment:   Intra-op Plan:   Post-operative Plan: Extubation in OR  Informed Consent: I have reviewed the patients History and Physical, chart, labs and discussed the procedure including the risks, benefits and alternatives for the proposed anesthesia with the patient or authorized representative who has indicated his/her understanding and acceptance.     Dental advisory given  Plan Discussed with: CRNA  Anesthesia Plan Comments:         Anesthesia Quick Evaluation

## 2021-06-04 NOTE — Anesthesia Procedure Notes (Signed)
Procedure Name: Intubation Date/Time: 06/04/2021 4:28 PM Performed by: Marena Chancy, CRNA Pre-anesthesia Checklist: Patient identified, Emergency Drugs available, Suction available and Patient being monitored Patient Re-evaluated:Patient Re-evaluated prior to induction Oxygen Delivery Method: Circle System Utilized Preoxygenation: Pre-oxygenation with 100% oxygen Induction Type: IV induction Ventilation: Mask ventilation without difficulty Laryngoscope Size: Miller and 2 Grade View: Grade I Tube type: Oral Number of attempts: 1 Airway Equipment and Method: Stylet and Oral airway Placement Confirmation: ETT inserted through vocal cords under direct vision, positive ETCO2 and breath sounds checked- equal and bilateral Tube secured with: Tape Dental Injury: Teeth and Oropharynx as per pre-operative assessment

## 2021-06-04 NOTE — Progress Notes (Signed)
Neurosurgery Service Post-operative progress note  Assessment & Plan: 35 y.o. woman s/p R VPS, seen in PACU, awake/alert and FCx4, pseudomeningocele improved in size.  -can return to 4NP -CTH, CXR, KUB, and XR skull AP / lateral views when able -activity as tolerated -diet as tolerated  Jadene Pierini  06/04/21 6:27 PM

## 2021-06-05 ENCOUNTER — Encounter (HOSPITAL_COMMUNITY): Payer: Self-pay | Admitting: Neurological Surgery

## 2021-06-05 MED ORDER — OXYCODONE HCL 5 MG PO TABS: 5.0000 mg | ORAL_TABLET | ORAL | 0 refills | Status: DC | PRN

## 2021-06-05 NOTE — Anesthesia Postprocedure Evaluation (Signed)
Anesthesia Post Note  Patient: Brooke Carrillo  Procedure(s) Performed: SHUNT INSERTION VENTRICULAR-PERITONEAL (Right: Head) APPLICATION OF CRANIAL NAVIGATION (Right)     Patient location during evaluation: PACU Anesthesia Type: General Level of consciousness: awake and alert Pain management: pain level controlled Vital Signs Assessment: post-procedure vital signs reviewed and stable Respiratory status: spontaneous breathing, nonlabored ventilation, respiratory function stable and patient connected to nasal cannula oxygen Cardiovascular status: blood pressure returned to baseline and stable Postop Assessment: no apparent nausea or vomiting Anesthetic complications: no   No notable events documented.  Last Vitals:  Vitals:   06/05/21 0334 06/05/21 0430  BP:    Pulse: 65 66  Resp: 12 13  Temp:    SpO2: 95% 97%    Last Pain:  Vitals:   06/05/21 0430  TempSrc:   PainSc: 2                  Kennieth Rad

## 2021-06-05 NOTE — Discharge Summary (Signed)
Discharge Summary  Date of Admission: 06/03/2021  Date of Discharge: 06/05/21  Attending Physician: Autumn Patty, MD  Hospital Course: Patient was admitted with continued / progressive headaches and nausea/vomiting. CTH showed continued worsening of subdural hygraomas and pseudomeningocele. She was taken to the OR on 9/6 for a right VP shunt with stereotactic guidance. Post-op, she felt substantially better and wanted to go home. Post-op shunt series and CTH showed intact system. Her hygroma was stable in size, but we discussed return symptoms should it enlarge and become symptomatic. Her hospital course was uncomplicated and the patient was discharged home on 06/05/21. She will follow up in clinic with me in 2 weeks.  Neurologic exam at discharge:  AOx3, PERRL, EOMI, FS, TM Strength 5/5 x4, SILTx4, no drift  Discharge diagnosis: Hydrocephalus  Jadene Pierini, MD 06/05/21 9:17 AM

## 2021-06-05 NOTE — Progress Notes (Addendum)
Neurosurgery Service Progress Note  Subjective: No acute events overnight, headache / nausea significantly improved post-op, she feels much better  Objective: Vitals:   06/05/21 0000 06/05/21 0319 06/05/21 0334 06/05/21 0430  BP:  (!) 134/91    Pulse: (!) 58 64 65 66  Resp: (!) 25 13 12 13   Temp:  98.3 F (36.8 C)    TempSrc:  Oral    SpO2: 98% 96% 95% 97%    Physical Exam: AOx3, PERRL, EOMI, FS, TM, Strength 5/5 x4, SILTx4 Incision c/d/I w/ pseudomeningocele that's soft / decreased in size, no drift   Assessment & Plan: 35 y.o. woman s/p chiari s/p decompression w/ pseudomeningocele s/p tap w/ no infection, recurrent headaches / vomiting, rpt CTH w/ worsening multifocal hygromas worst on the L with midline shift. 9/6 s/p R VPS  -discharge home today -shunt series show intact system -post-op CTH w/ stable hygroma, discussed w/ the pt re return symptoms  11/6  06/05/21 9:12 AM

## 2021-06-09 ENCOUNTER — Other Ambulatory Visit: Payer: Self-pay

## 2021-06-09 ENCOUNTER — Inpatient Hospital Stay (HOSPITAL_COMMUNITY)
Admission: EM | Admit: 2021-06-09 | Discharge: 2021-06-16 | DRG: 033 | Disposition: A | Discharge status: Home / Self Care | Payer: Medicaid Other | Attending: Neurological Surgery | Admitting: Neurological Surgery

## 2021-06-09 ENCOUNTER — Encounter (HOSPITAL_COMMUNITY): Payer: Self-pay | Admitting: Emergency Medicine

## 2021-06-09 ENCOUNTER — Inpatient Hospital Stay (HOSPITAL_COMMUNITY): Payer: Medicaid Other

## 2021-06-09 ENCOUNTER — Emergency Department (HOSPITAL_COMMUNITY): Payer: Medicaid Other

## 2021-06-09 DIAGNOSIS — Z982 Presence of cerebrospinal fluid drainage device: Secondary | ICD-10-CM

## 2021-06-09 DIAGNOSIS — G9782 Other postprocedural complications and disorders of nervous system: Principal | ICD-10-CM | POA: Diagnosis present

## 2021-06-09 DIAGNOSIS — Z20822 Contact with and (suspected) exposure to covid-19: Secondary | ICD-10-CM | POA: Diagnosis present

## 2021-06-09 DIAGNOSIS — G96198 Other disorders of meninges, not elsewhere classified: Secondary | ICD-10-CM | POA: Diagnosis present

## 2021-06-09 DIAGNOSIS — Y838 Other surgical procedures as the cause of abnormal reaction of the patient, or of later complication, without mention of misadventure at the time of the procedure: Secondary | ICD-10-CM | POA: Diagnosis present

## 2021-06-09 DIAGNOSIS — F17210 Nicotine dependence, cigarettes, uncomplicated: Secondary | ICD-10-CM | POA: Diagnosis present

## 2021-06-09 DIAGNOSIS — R220 Localized swelling, mass and lump, head: Secondary | ICD-10-CM

## 2021-06-09 DIAGNOSIS — T85618A Breakdown (mechanical) of other specified internal prosthetic devices, implants and grafts, initial encounter: Secondary | ICD-10-CM | POA: Diagnosis present

## 2021-06-09 DIAGNOSIS — R519 Headache, unspecified: Secondary | ICD-10-CM

## 2021-06-09 LAB — CBC WITH DIFFERENTIAL/PLATELET
Abs Immature Granulocytes: 0.03 K/uL (ref 0.00–0.07)
Basophils Absolute: 0 K/uL (ref 0.0–0.1)
Basophils Relative: 0 %
Eosinophils Absolute: 0.3 K/uL (ref 0.0–0.5)
Eosinophils Relative: 3 %
HCT: 38.5 % (ref 36.0–46.0)
Hemoglobin: 13 g/dL (ref 12.0–15.0)
Immature Granulocytes: 0 %
Lymphocytes Relative: 31 %
Lymphs Abs: 3.6 K/uL (ref 0.7–4.0)
MCH: 32.8 pg (ref 26.0–34.0)
MCHC: 33.8 g/dL (ref 30.0–36.0)
MCV: 97.2 fL (ref 80.0–100.0)
Monocytes Absolute: 0.6 K/uL (ref 0.1–1.0)
Monocytes Relative: 5 %
Neutro Abs: 7.1 K/uL (ref 1.7–7.7)
Neutrophils Relative %: 61 %
Platelets: 269 K/uL (ref 150–400)
RBC: 3.96 MIL/uL (ref 3.87–5.11)
RDW: 12.2 % (ref 11.5–15.5)
WBC: 11.6 K/uL — ABNORMAL HIGH (ref 4.0–10.5)
nRBC: 0 % (ref 0.0–0.2)

## 2021-06-09 LAB — COMPREHENSIVE METABOLIC PANEL WITH GFR
ALT: 53 U/L — ABNORMAL HIGH (ref 0–44)
AST: 33 U/L (ref 15–41)
Albumin: 3.8 g/dL (ref 3.5–5.0)
Alkaline Phosphatase: 83 U/L (ref 38–126)
Anion gap: 7 (ref 5–15)
BUN: <5 mg/dL — ABNORMAL LOW (ref 6–20)
CO2: 33 mmol/L — ABNORMAL HIGH (ref 22–32)
Calcium: 10.6 mg/dL — ABNORMAL HIGH (ref 8.9–10.3)
Chloride: 97 mmol/L — ABNORMAL LOW (ref 98–111)
Creatinine, Ser: 0.88 mg/dL (ref 0.44–1.00)
GFR, Estimated: >60 mL/min
Glucose, Bld: 115 mg/dL — ABNORMAL HIGH (ref 70–99)
Potassium: 3.4 mmol/L — ABNORMAL LOW (ref 3.5–5.1)
Sodium: 137 mmol/L (ref 135–145)
Total Bilirubin: 0.5 mg/dL (ref 0.3–1.2)
Total Protein: 6.4 g/dL — ABNORMAL LOW (ref 6.5–8.1)

## 2021-06-09 MED ORDER — SODIUM CHLORIDE 0.9% FLUSH: 3.0000 mL | INTRAVENOUS | Status: DC | PRN

## 2021-06-09 MED ORDER — ONDANSETRON HCL 4 MG/2ML IJ SOLN: 4.0000 mg | Freq: Four times a day (QID) | INTRAMUSCULAR | Status: DC | PRN

## 2021-06-09 MED ORDER — SODIUM CHLORIDE 0.9 % IV SOLN: 250.0000 mL | INTRAVENOUS | Status: DC | PRN

## 2021-06-09 MED ORDER — ACETAMINOPHEN 325 MG PO TABS
650.0000 mg | ORAL_TABLET | Freq: Four times a day (QID) | ORAL | Status: DC | PRN
  Administered 2021-06-15: 650 mg via ORAL
  Filled 2021-06-09 (×2): qty 2

## 2021-06-09 MED ORDER — SODIUM CHLORIDE 0.9% FLUSH: 3.0000 mL | Freq: Two times a day (BID) | INTRAVENOUS | Status: DC

## 2021-06-09 MED ORDER — HYDROCODONE-ACETAMINOPHEN 5-325 MG PO TABS
1.0000 | ORAL_TABLET | ORAL | Status: DC | PRN
  Administered 2021-06-09 – 2021-06-11 (×3): 1 via ORAL
  Administered 2021-06-11: 2 via ORAL
  Filled 2021-06-09: qty 1
  Filled 2021-06-09: qty 2
  Filled 2021-06-09 (×3): qty 1
  Filled 2021-06-09: qty 2

## 2021-06-09 MED ORDER — HYDROMORPHONE HCL 1 MG/ML IJ SOLN
0.5000 mg | INTRAMUSCULAR | Status: DC | PRN
  Administered 2021-06-11 – 2021-06-16 (×7): 1 mg via INTRAVENOUS
  Filled 2021-06-09 (×7): qty 1

## 2021-06-09 MED ORDER — ONDANSETRON HCL 4 MG PO TABS: 4.0000 mg | ORAL_TABLET | Freq: Four times a day (QID) | ORAL | Status: DC | PRN

## 2021-06-09 MED ORDER — ACETAMINOPHEN 650 MG RE SUPP: 650.0000 mg | Freq: Four times a day (QID) | RECTAL | Status: DC | PRN

## 2021-06-09 MED ORDER — SODIUM CHLORIDE 0.9% FLUSH
3.0000 mL | Freq: Two times a day (BID) | INTRAVENOUS | Status: DC
  Administered 2021-06-09 – 2021-06-16 (×12): 3 mL via INTRAVENOUS

## 2021-06-09 NOTE — ED Triage Notes (Addendum)
Pt states she had a ventricular shunt placed on 06/04/21, c/o fluid build up, headache, nausea/vomiting. Was advised to come to ED for admission.

## 2021-06-09 NOTE — H&P (Signed)
Brooke Carrillo is an 35 y.o. female.   Chief Complaint: Headache HPI: 35 year old status post Chiari malformation decompression with postoperative pseudomeningocele.  Patient recently status post VP shunt placement.  Patient presents now with tense pseudomeningocele but no wound drainage.  No history of fever.  No abdominal symptoms.  No change in level of consciousness.  Past Medical History:  Diagnosis Date   Headache    Pregnancy as incidental finding     Past Surgical History:  Procedure Laterality Date   APPLICATION OF CRANIAL NAVIGATION Right 06/04/2021   Procedure: APPLICATION OF CRANIAL NAVIGATION;  Surgeon: Jadene Pierini, MD;  Location: [facility] OR;  Service: Neurosurgery;  Laterality: Right;   CHOLECYSTECTOMY N/A 08/27/2020   Procedure: LAPAROSCOPIC CHOLECYSTECTOMY;  Surgeon: [name], MD;  Location: [facility] OR;  Service: General;  Laterality: N/A;   LAPAROSCOPIC TUBAL LIGATION Bilateral 07/23/2018   Procedure: LAPAROSCOPIC TUBAL LIGATION;  Surgeon: Hermina Staggers, MD;  Location: [facility] ORS;  Service: Gynecology;  Laterality: Bilateral;   ORIF FINGER / THUMB FRACTURE     SUBOCCIPITAL CRANIECTOMY CERVICAL LAMINECTOMY N/A 04/08/2021   Procedure: Suboccipital craniectomy for chiari decompression;  Surgeon: Jadene Pierini, MD;  Location: [facility] OR;  Service: Neurosurgery;  Laterality: N/A;   VENTRICULOPERITONEAL SHUNT Right 06/04/2021   Procedure: SHUNT INSERTION VENTRICULAR-PERITONEAL;  Surgeon: Jadene Pierini, MD;  Location: [facility] OR;  Service: Neurosurgery;  Laterality: Right;   WISDOM TOOTH EXTRACTION      Family History  Problem Relation Age of Onset   Hypertension Father    Stroke Father    Heart disease Father    Dementia Mother    Cancer Maternal Aunt        Breast   HIV Maternal Aunt    Stroke Maternal Uncle    Cancer Paternal Uncle        Colon   Cancer Maternal Grandmother    Hypertension Paternal Grandmother    Cancer Paternal Grandfather        Colon    Social History:  reports that she has been smoking cigarettes. She has been smoking an average of .5 packs per day. She has never used smokeless tobacco. 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 She reports that she does not drink alcohol.  Allergies: No Known Allergies  (Not in a hospital admission)   Results for orders placed or performed during the hospital encounter of 06/09/21 (from the past 48 hour(s))  CBC with Differential     Status: Abnormal   Collection Time: 06/09/21  8:08 PM  Result Value Ref Range   WBC 11.6 (H) 4.0 - 10.5 K/uL   RBC 3.96 3.87 - 5.11 MIL/uL   Hemoglobin 13.0 12.0 - 15.0 g/dL   HCT 78.2 95.6 - 21.3 %   MCV 97.2 80.0 - 100.0 fL   MCH 32.8 26.0 - 34.0 pg   MCHC 33.8 30.0 - 36.0 g/dL   RDW 08.6 57.8 - 46.9 %   Platelets 269 150 - 400 K/uL   nRBC 0.0 0.0 - 0.2 %   Neutrophils Relative % 61 %   Neutro Abs 7.1 1.7 - 7.7 K/uL   Lymphocytes Relative 31 %   Lymphs Abs 3.6 0.7 - 4.0 K/uL   Monocytes Relative 5 %   Monocytes Absolute 0.6 0.1 - 1.0 K/uL   Eosinophils Relative 3 %   Eosinophils Absolute 0.3 0.0 - 0.5 K/uL   Basophils Relative 0 %   Basophils Absolute 0.0 0.0 - 0.1 K/uL   Immature  Granulocytes 0 %   Abs Immature Granulocytes 0.03 0.00 - 0.07 K/uL    Comment: Performed at [facility], [address], [place], [place] [address]  Comprehensive metabolic panel     Status: Abnormal   Collection Time: 06/09/21  8:08 PM  Result Value Ref Range   Sodium 137 135 - 145 mmol/L   Potassium 3.4 (L) 3.5 - 5.1 mmol/L   Chloride 97 (L) 98 - 111 mmol/L   CO2 33 (H) 22 - 32 mmol/L   Glucose, Bld 115 (H) 70 - 99 mg/dL    Comment: Glucose reference range applies only to samples taken after fasting for at least 8 hours.   BUN <5 (L) 6 - 20 mg/dL   Creatinine, Ser 4.33 0.44 - 1.00 mg/dL   Calcium 29.5 (H) 8.9 - 10.3 mg/dL   Total Protein 6.4 (L) 6.5 - 8.1 g/dL   Albumin 3.8 3.5 - 5.0 g/dL   AST 33 15 - 41 U/L   ALT 53 (H) 0 - 44 U/L    Alkaline Phosphatase 83 38 - 126 U/L   Total Bilirubin 0.5 0.3 - 1.2 mg/dL   GFR, Estimated >18 >84 mL/min    Comment: (NOTE) Calculated using the CKD-EPI Creatinine Equation (2021)    Anion gap 7 5 - 15    Comment: Performed at [facility], [address], [place], [place] [address]   No results found.  Pertinent items noted in HPI and remainder of comprehensive ROS otherwise negative.  Blood pressure 95/65, pulse 92, temperature 98.6 F (37 C), temperature source Oral, resp. rate 18, SpO2 99 %.  Patient is awake and alert.  She is oriented and appropriate.  Speech is fluent.  Judgment insight are intact.  Cranial nerve function normal bilateral.  Motor examination intact bilateral sensory examination nonfocal.  Wounds clean and dry.  Her posterior suboccipital wound is tense with a bulging pseudomeningocele.  Chest and abdomen are benign. Assessment/Plan Probable VP shunt malfunction.  CT head and shunt series pending.  Plan admission.  We will keep NPO.  For possible shunt revision tomorrow.  Sherilyn Cooter A Taryll Reichenberger 06/09/2021, 10:11 PM

## 2021-06-09 NOTE — ED Provider Notes (Signed)
[facility]   CSN: [id number] Arrival date & time: 06/09/21  1942     History Chief Complaint  Patient presents with   Headache    Brooke Carrillo is a 35 y.o. female.  HPI Patient is a 35 year old female who presents to the emergency department due to worsening headache.  Patient initially seen on September 5 and diagnosed with a worsening of subdural hygromas and pseudomeningocele.  She was taken to the OR on September 6 and a right VP shunt with stereotactic guidance was placed.  Postop shunt series and CTh showed intact system.  Patient states that she was discharged on symptom her seventh after the procedure.  She states that she continue to experience intermittent headaches along the crown of her head that she states were also occurring prior to the surgery.  No modifying factors.  States when they occur they are typically about a 7/10.  Reports intermittent nausea/vomiting.  No nausea currently.  Also complains of intermittent sensation of "feeling off balance".  States that she had an region of swelling along the occiput initially that was drained during the surgery.  She states that the swelling has recently returned and she reached out to the neurosurgical team who recommended that she come to the emergency department for admission.    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   Chiari malformation type I (HCC) 04/08/2021   Chest pain of uncertain etiology 11/08/2020   Palpitations 11/08/2020   Acute cholecystitis 08/27/2020   Sinus bradycardia 08/27/2020   Postpartum care and examination 06/01/2018   Unwanted fertility 06/01/2018   Contraception management 06/01/2018   Vitamin D deficiency 10/21/2017   Tobacco abuse 10/13/2017    Past Surgical History:   Procedure Laterality Date   APPLICATION OF CRANIAL NAVIGATION Right 06/04/2021   Procedure: APPLICATION OF CRANIAL NAVIGATION;  Surgeon: Jadene Pierinistergard, Thomas A, MD;  Location: [facility] OR;  Service: Neurosurgery;  Laterality: Right;   CHOLECYSTECTOMY N/A 08/27/2020   Procedure: LAPAROSCOPIC CHOLECYSTECTOMY;  Surgeon: Emelia LoronWakefield, Matthew, MD;  [facility];  Service: General;  Laterality: N/A;   LAPAROSCOPIC TUBAL LIGATION Bilateral 07/23/2018   Procedure: LAPAROSCOPIC TUBAL LIGATION;  Surgeon: Hermina StaggersErvin, Michael L, MD;  Location: [facility] ORS;  Service: Gynecology;  Laterality: Bilateral;   ORIF FINGER / THUMB FRACTURE     SUBOCCIPITAL CRANIECTOMY CERVICAL LAMINECTOMY N/A 04/08/2021   Procedure: Suboccipital craniectomy for chiari decompression;  Surgeon: Jadene Pierinistergard, Thomas A, MD;  [facility];  Service: Neurosurgery;  Laterality: N/A;   VENTRICULOPERITONEAL SHUNT Right 06/04/2021   Procedure: SHUNT INSERTION VENTRICULAR-PERITONEAL;  Surgeon: Jadene Pierinistergard, Thomas A, MD;  Location: [facility] OR;  Service: Neurosurgery;  Laterality: Right;   WISDOM TOOTH EXTRACTION       OB History     Gravida  8   Para  4   Term  4   Preterm  0   AB  4   Living  4      SAB  3   IAB  1   Ectopic  0   Multiple  0   Live Births  4           Family History  Problem Relation Age of Onset   Hypertension Father    Stroke Father    Heart disease Father  Dementia Mother    Cancer Maternal Aunt        Breast   HIV Maternal Aunt    Stroke Maternal Uncle    Cancer Paternal Uncle        Colon   Cancer Maternal Grandmother    Hypertension Paternal Grandmother    Cancer Paternal Grandfather        Colon    Social History   Tobacco Use   Smoking status: Every Day    Packs/day: 0.50    Types: Cigarettes   Smokeless tobacco: Never  Vaping Use   Vaping Use: Former  Substance Use Topics   Alcohol use: No   Drug use: Yes    Types: Heroin    Comment: "started a week ago"    Home Medications Prior to  Admission medications   Medication Sig Start Date End Date Taking? Authorizing Provider  acetaminophen (TYLENOL) 325 MG tablet Take 650 mg by mouth every 6 (six) hours as needed for moderate pain or headache.    [provider]  cyclobenzaprine (FLEXERIL) 10 MG tablet Take 1 tablet (10 mg total) by mouth 3 (three) times daily as needed for muscle spasms. 05/02/21   Alvira Monday, MD  oxyCODONE (OXY IR/ROXICODONE) 5 MG immediate release tablet Take 1 tablet (5 mg total) by mouth every 4 (four) hours as needed (pain). 06/05/21   Jadene Pierini, MD  prochlorperazine (COMPAZINE) 10 MG tablet Take 1 tablet (10 mg total) by mouth 2 (two) times daily as needed for nausea or vomiting (headache). 05/02/21   Alvira Monday, MD    Allergies    Patient has no known allergies.  Review of Systems   Review of Systems  All other systems reviewed and are negative. Ten systems reviewed and are negative for acute change, except as noted in the HPI.   Physical Exam Updated Vital Signs BP 95/65 (BP Location: Left Arm)   Pulse 92   Temp 98.6 F (37 C) (Oral)   Resp 18   LMP  (LMP Unknown)   SpO2 99%   Physical Exam Vitals and nursing note reviewed.  Constitutional:      General: She is not in acute distress.    Appearance: Normal appearance. She is not ill-appearing, toxic-appearing or diaphoretic.  HENT:     Head:     Comments: Moderate region of edema noted along the occiput.  Small amount of erythema central to this.  Well-healing surgical scar noted along the right pleural region.    Right Ear: External ear normal.     Left Ear: External ear normal.     Nose: Nose normal.     Mouth/Throat:     Mouth: Mucous membranes are moist.     Pharynx: Oropharynx is clear. No oropharyngeal exudate or posterior oropharyngeal erythema.  Eyes:     General: No scleral icterus.    Extraocular Movements: Extraocular movements intact.     Right eye: Normal extraocular motion and no nystagmus.      Left eye: Normal extraocular motion and no nystagmus.     Pupils: Pupils are equal, round, and reactive to light. Pupils are equal.     Right eye: Pupil is round and reactive.     Left eye: Pupil is round and reactive.  Cardiovascular:     Rate and Rhythm: Normal rate and regular rhythm.     Pulses: Normal pulses.     Heart sounds: Normal heart sounds. No murmur heard.   No friction rub.  No gallop.  Pulmonary:     Effort: Pulmonary effort is normal. No respiratory distress.     Breath sounds: Normal breath sounds. No stridor. No wheezing, rhonchi or rales.  Abdominal:     General: Abdomen is flat.     Palpations: Abdomen is soft.     Tenderness: There is no abdominal tenderness.  Musculoskeletal:        General: Normal range of motion.     Cervical back: Normal range of motion and neck supple. No tenderness.  Skin:    General: Skin is warm and dry.  Neurological:     General: No focal deficit present.     Mental Status: She is alert and oriented to person, place, and time.     GCS: GCS eye subscore is 4. GCS verbal subscore is 5. GCS motor subscore is 6.     Comments: A&O x3.  Speaking clearly, coherently, and in complete sentences.  Moving all 4 extremities with ease.  No gross deficits.  Psychiatric:        Mood and Affect: Mood normal.        Behavior: Behavior normal.   ED Results / Procedures / Treatments   Labs (all labs ordered are listed, but only abnormal results are displayed) Labs Reviewed  CBC WITH DIFFERENTIAL/PLATELET - Abnormal; Notable for the following components:      Result Value   WBC 11.6 (*)    All other components within normal limits  COMPREHENSIVE METABOLIC PANEL - Abnormal; Notable for the following components:   Potassium 3.4 (*)    Chloride 97 (*)    CO2 33 (*)    Glucose, Bld 115 (*)    BUN <5 (*)    Calcium 10.6 (*)    Total Protein 6.4 (*)    ALT 53 (*)    All other components within normal limits  RESP PANEL BY RT-PCR (FLU A&B, COVID)  ARPGX2  BASIC METABOLIC PANEL  CBC    EKG None  Radiology DG Skull 1-3 Views  Result Date: 06/09/2021 CLINICAL DATA:  Headache, evaluate for shunt malfunction EXAM: SKULL - 1-3 VIEW COMPARISON:  06/04/2021 FINDINGS: Right frontotemporal approach VP shunt catheter coursing along the right head/neck, unchanged from recent prior, without evidence of discontinuity. IMPRESSION: Right frontotemporal approach VP shunt catheter without discontinuity, unchanged from recent prior. Electronically Signed   By: Charline Bills M.D.   On: 06/09/2021 22:27   DG Chest 1 View  Result Date: 06/09/2021 CLINICAL DATA:  Headache, evaluate for shunt malfunction EXAM: CHEST  1 VIEW COMPARISON:  06/04/2021 FINDINGS: VP shunt catheter courses along the right chest into the right upper abdomen. Lungs are clear.  No pleural effusion or pneumothorax. The heart is normal in size. IMPRESSION: VP shunt catheter along the right chest. Electronically Signed   By: Charline Bills M.D.   On: 06/09/2021 22:26   DG Cervical Spine 1 View  Result Date: 06/09/2021 CLINICAL DATA:  Headache, evaluate for shunt malfunction EXAM: DG CERVICAL SPINE - 1 VIEW COMPARISON:  06/04/2021 FINDINGS: VP shunt catheter coursing along the right neck and upper chest. IMPRESSION: VP shunt catheter along the right neck. Electronically Signed   By: Charline Bills M.D.   On: 06/09/2021 22:24   DG Abd 1 View  Result Date: 06/09/2021 CLINICAL DATA:  Headache, nausea/vomiting, evaluate for shunt malfunction EXAM: ABDOMEN - 1 VIEW COMPARISON:  06/04/2021 FINDINGS: VP shunt catheter courses along the right abdomen and terminates in the left pelvis. Mildly prominent  but nondilated loops of bowel in the central abdomen. Cholecystectomy clips. Tubal ligation clips in the right pelvis. IMPRESSION: Stable VP shunt catheter, as above. Electronically Signed   By: Charline Bills M.D.   On: 06/09/2021 22:26    Procedures Procedures   Medications Ordered  in ED Medications  sodium chloride flush (NS) 0.9 % injection 3 mL (has no administration in time range)  sodium chloride flush (NS) 0.9 % injection 3 mL (has no administration in time range)  0.9 %  sodium chloride infusion (has no administration in time range)  acetaminophen (TYLENOL) tablet 650 mg (has no administration in time range)    Or  acetaminophen (TYLENOL) suppository 650 mg (has no administration in time range)  HYDROcodone-acetaminophen (NORCO/VICODIN) 5-325 MG per tablet 1-2 tablet (has no administration in time range)  HYDROmorphone (DILAUDID) injection 0.5-1 mg (has no administration in time range)  ondansetron (ZOFRAN) tablet 4 mg (has no administration in time range)    Or  ondansetron (ZOFRAN) injection 4 mg (has no administration in time range)    ED Course  I have reviewed the triage vital signs and the nursing notes.  Pertinent labs & imaging results that were available during my care of the patient were reviewed by me and considered in my medical decision making (see chart for details).    MDM Rules/Calculators/A&P                          Pt is a 35 y.o. female who presents to the emergency department due to headaches, nausea, vomiting status post VP shunt placement on September 6.  Labs: CBC with a white count of 11.6. CMP with a potassium of 3.4, chloride 97, CO2 of 33, glucose of 115, BUN less than 5, calcium of 10.6, total protein of 6.4, ALT of 53.  Imaging: VP shunt series with findings as noted above. CT scan of the head without contrast is pending.  I, Placido Sou, PA-C, personally reviewed and evaluated these images and lab results as part of my medical decision-making.  Physical exam with findings as noted above.  Patient offered antiemetics as well as pain medications but declined at this time.  Patient reached out to the neurosurgery staff prior to coming to the emergency department who were aware of her arrival and have placed admission  orders at this time.  Note: Portions of this report may have been transcribed using voice recognition software. Every effort was made to ensure accuracy; however, inadvertent computerized transcription errors may be present.   Final Clinical Impression(s) / ED Diagnoses Final diagnoses:  Headache  S/P VP shunt  Localized swelling of head   Rx / DC Orders ED Discharge Orders     None        Placido Sou, PA-C 06/09/21 2238    Terald Sleeper, MD 06/09/21 2249

## 2021-06-09 NOTE — ED Provider Notes (Signed)
Emergency Medicine Provider Triage Evaluation Note  Brooke Carrillo , a 35 y.o. female  was evaluated in triage.  Pt complains of swelling to the back of her head.  According to records, recently had a VP shunt placed by Dr. Johnsie Cancel on 9 6.  Reports progressively swelling that has been ongoing for the past 24 hours.  No vision changes, does endorse a headache however did not take any medication for pain control.  Review of Systems  Positive: Headache, swelling Negative: Changes in vision, fever  Physical Exam  BP 95/65 (BP Location: Left Arm)   Pulse 92   Temp 98.6 F (37 C) (Oral)   Resp 18   LMP  (LMP Unknown)   SpO2 99%  Gen:   Awake, no distress   Resp:  Normal effort  MSK:   Moves extremities without difficulty  Other:  Swelling noted to the posterior occipital aspect.  Pain with palpation, incision without any erythema.  Medical Decision Making  Medically screening exam initiated at 7:59 PM.  Appropriate orders placed.  Brooke Carrillo was informed that the remainder of the evaluation will be completed by another provider, this initial triage assessment does not replace that evaluation, and the importance of remaining in the ED until their evaluation is complete.    Prior notes from Dr. Gari Crown neurosurgery.   Patient was admitted with continued / progressive headaches and nausea/vomiting. CTH showed continued worsening of subdural hygraomas and pseudomeningocele. She was taken to the OR on 9/6 for a right VP shunt with stereotactic guidance.  Due to patient acuity, RN was notified of emergency of room needed.  Lab work has been ordered.   Claude Manges, PA-C 06/09/21 2003    Long, Joshua G, MD 06/16/21 1754

## 2021-06-10 LAB — CBC
HCT: 36.3 % (ref 36.0–46.0)
Hemoglobin: 12 g/dL (ref 12.0–15.0)
MCH: 32.3 pg (ref 26.0–34.0)
MCHC: 33.1 g/dL (ref 30.0–36.0)
MCV: 97.6 fL (ref 80.0–100.0)
Platelets: 292 10*3/uL (ref 150–400)
RBC: 3.72 MIL/uL — ABNORMAL LOW (ref 3.87–5.11)
RDW: 12.4 % (ref 11.5–15.5)
WBC: 10.6 10*3/uL — ABNORMAL HIGH (ref 4.0–10.5)
nRBC: 0 % (ref 0.0–0.2)

## 2021-06-10 LAB — BASIC METABOLIC PANEL
Anion gap: 6 (ref 5–15)
BUN: <5 mg/dL — ABNORMAL LOW (ref 6–20)
CO2: 35 mmol/L — ABNORMAL HIGH (ref 22–32)
Calcium: 9.9 mg/dL (ref 8.9–10.3)
Chloride: 96 mmol/L — ABNORMAL LOW (ref 98–111)
Creatinine, Ser: 0.84 mg/dL (ref 0.44–1.00)
GFR, Estimated: >60 mL/min (ref >60)
Glucose, Bld: 112 mg/dL — ABNORMAL HIGH (ref 70–99)
Potassium: 3.3 mmol/L — ABNORMAL LOW (ref 3.5–5.1)
Sodium: 137 mmol/L (ref 135–145)

## 2021-06-10 LAB — RESP PANEL BY RT-PCR (FLU A&B, COVID) ARPGX2
Influenza A by PCR: NEGATIVE
Influenza B by PCR: NEGATIVE
SARS Coronavirus 2 by RT PCR: NEGATIVE

## 2021-06-10 MED ORDER — DIPHENHYDRAMINE HCL 50 MG/ML IJ SOLN
25.0000 mg | Freq: Once | INTRAMUSCULAR | Status: AC
  Administered 2021-06-10: 25 mg via INTRAVENOUS
  Filled 2021-06-10: qty 1

## 2021-06-10 MED ORDER — PROCHLORPERAZINE EDISYLATE 10 MG/2ML IJ SOLN
10.0000 mg | Freq: Once | INTRAMUSCULAR | Status: AC
  Administered 2021-06-10: 10 mg via INTRAVENOUS
  Filled 2021-06-10: qty 2

## 2021-06-10 NOTE — ED Notes (Signed)
Paged and spoke to provider, she is going to order medications for headache.

## 2021-06-10 NOTE — Progress Notes (Signed)
Neurosurgery Service Progress Note  Subjective: Notes reviewed, pt returned w/ tense pseudomeningocele after recent shunt placement, having headaches otherwise feeling okay but pseudomeningocele is tense   Objective: Vitals:   06/10/21 0500 06/10/21 0556 06/10/21 0600 06/10/21 0618  BP: (!) 99/58 113/68 114/76   Pulse: 67 65 66   Resp:  14    Temp:  (!) 97.4 F (36.3 C)  98 F (36.7 C)  TempSrc:  Oral  Oral  SpO2: 93% 93% 93%   Weight:      Height:        Physical Exam: AOx3, PERRL, EOMI, FS, TM, Strength 5/5 x4, SILTx4, no drift, wounds well healed but pseudomeningocele larger and tense  Assessment & Plan: 35 y.o. woman s/p chiari decompression then tense pseudomeningocele c/w HCP s/p VPS placement, now returns with tense pseudomeningocele. CTH shows resolution of her prior large subdural hygroma and midline shift, catheter in good position, ventricles smaller than baseline / preop scan when her hygroma wasn't present.  -shunt radiographically appears to be functioning, her CT shows improvement, will dial it down from 3.0 to 2.0 to see if she requires greater drainage and monitor her progress  Brooke Carrillo  06/10/21 7:36 AM

## 2021-06-10 NOTE — Progress Notes (Signed)
RN called [facility] and left a message for the MD. Pt is complaining of a headache and stated that she received a cocktail yesterday that helped with her HA also she wants to know is she will have her procedure today because she is hungry and has been NPO all day told her I would ask and let her know awaiting call back.

## 2021-06-11 LAB — PROTEIN AND GLUCOSE, CSF
Glucose, CSF: 22 mg/dL — CL (ref 40–70)
Total  Protein, CSF: 57 mg/dL — ABNORMAL HIGH (ref 15–45)

## 2021-06-11 LAB — CSF CELL COUNT WITH DIFFERENTIAL
Eosinophils, CSF: 10 % — ABNORMAL HIGH (ref 0–1)
Lymphs, CSF: 74 % (ref 40–80)
Monocyte-Macrophage-Spinal Fluid: 14 % — ABNORMAL LOW (ref 15–45)
RBC Count, CSF: 1 /mm3 — ABNORMAL HIGH
Segmented Neutrophils-CSF: 2 % (ref 0–6)
Tube #: 1
WBC, CSF: 390 /mm3 (ref 0–5)

## 2021-06-11 MED ORDER — PROCHLORPERAZINE EDISYLATE 10 MG/2ML IJ SOLN
10.0000 mg | Freq: Four times a day (QID) | INTRAMUSCULAR | Status: DC | PRN
  Administered 2021-06-11 – 2021-06-16 (×5): 10 mg via INTRAVENOUS
  Filled 2021-06-11 (×6): qty 2

## 2021-06-11 NOTE — Progress Notes (Signed)
Neurosurgery Service Progress Note  Subjective: No acute events overnight, headache persists, mild nausea no vomiting,    Objective: Vitals:   06/10/21 1501 06/10/21 1924 06/10/21 2333 06/11/21 0415  BP: 116/61 122/71 (!) 96/53 119/62  Pulse: 69 78 81 69  Resp: 18 16 18 16   Temp: 98.5 F (36.9 C) 99.5 F (37.5 C) 98.7 F (37.1 C) 98.8 F (37.1 C)  TempSrc: Oral Oral Oral Oral  SpO2: 96% 100% 99% 96%  Weight:      Height:        Physical Exam: AOx3, PERRL, EOMI, FS, TM, Strength 5/5 x4, SILTx4, no drift Pseudomeningocele tense  Assessment & Plan: 35 y.o. woman s/p chiari decompression, hydrocephalus s/p VPS placement, p/w enlarging pseudomeningocele, 9/12 Certas dialed 3-->2.  -will dial down to 1 and tap her pseudomeningocele to check sterility and make sure it's in continuity with CSF space. If no improvement, will need a shunt revision  11/12  06/11/21 7:38 AM

## 2021-06-11 NOTE — Procedures (Signed)
PROCEDURE NOTE  Suboccipital region prepped and draped in a sterile fashion, 20ga needle used to percutaneously access CSF with negative pressure until pseudomeningocele aspirated, CSF drained until pseudomeningocele flat. Just as it was getting soft, she developed a headache that was improved w/ trendelenburg, so I did not remove any more CSF and placed collected in clear  containers and sent for usual studies. CSF was clear.

## 2021-06-12 NOTE — Progress Notes (Signed)
Neurosurgery Service Progress Note  Subjective: No acute events overnight, spinal / post-tap headache resolved but pseudomeningocele refilled fairly quickly and high pressure symptoms returned  Objective: Vitals:   06/11/21 1718 06/11/21 2025 06/11/21 2356 06/12/21 0439  BP: 119/69 119/68 94/61 100/60  Pulse: 67 72 77 80  Resp: 18 19 17 16   Temp: 98.6 F (37 C) 98.4 F (36.9 C) 98.3 F (36.8 C) 98.7 F (37.1 C)  TempSrc: Oral Oral Oral Oral  SpO2: 96% 94% 98% 98%  Weight:      Height:        Physical Exam: AOx3, PERRL, EOMI, FS, TM, Strength 5/5 x4, SILTx4, no drift Pseudomeningocele tense  Assessment & Plan: 35 y.o. woman s/p chiari decompression, hydrocephalus s/p VPS placement, p/w enlarging pseudomeningocele, 9/12 Certas dialed 3-->2, 9/13 dialed 2-->1 and pseudomeningocele tapped  -CSF gram stain / Cx neg thus far, if they continue to be negative, will take to the OR tomorrow for shunt revision -NPO p MN  10/13  06/12/21 12:09 PM

## 2021-06-13 LAB — PATHOLOGIST SMEAR REVIEW

## 2021-06-13 NOTE — Progress Notes (Signed)
Neurosurgery Service Progress Note  Subjective: No acute events overnight, headache and neck pain from the pseudomeningocele but otherwise doing well   Objective: Vitals:   06/12/21 1215 06/12/21 1803 06/13/21 0039 06/13/21 0552  BP: (!) 119/58 121/73 102/62 106/66  Pulse: 84 87 75 89  Resp: 16 16 16 18   Temp: 98.4 F (36.9 C) 98.7 F (37.1 C) 98.2 F (36.8 C) 98.4 F (36.9 C)  TempSrc: Oral Oral Oral Oral  SpO2: 98% 99% 96% 96%  Weight:      Height:        Physical Exam: AOx3, PERRL, EOMI, FS, TM, Strength 5/5 x4, SILTx4, no drift Pseudomeningocele tense  Assessment & Plan: 35 y.o. woman s/p chiari decompression, hydrocephalus s/p VPS placement, p/w enlarging pseudomeningocele, 9/12 Certas dialed 3-->2, 9/13 dialed 2-->1 and pseudomeningocele tapped  -OR time not available today, discussed w/ pt, will go to the OR tomorrow at 07:30 for VPS exploration / revision, NPO p MN  10/13 A Lolah Coghlan  06/13/21 9:15 AM

## 2021-06-13 NOTE — Anesthesia Preprocedure Evaluation (Addendum)
Anesthesia Evaluation  Patient identified by MRN, date of birth, ID band Patient awake    Reviewed: Allergy & Precautions, NPO status , Patient's Chart, lab work & pertinent test results  Airway Mallampati: I  TM Distance: >3 FB Neck ROM: Full    Dental  (+) Poor Dentition   Pulmonary Current Smoker and Patient abstained from smoking.,    Pulmonary exam normal breath sounds clear to auscultation       Cardiovascular negative cardio ROS Normal cardiovascular exam Rhythm:Regular Rate:Normal     Neuro/Psych  Headaches, negative psych ROS   GI/Hepatic negative GI ROS, (+)     substance abuse  ,   Endo/Other  negative endocrine ROS  Renal/GU negative Renal ROS     Musculoskeletal negative musculoskeletal ROS (+) narcotic dependent  Abdominal   Peds  Hematology negative hematology ROS (+)   Anesthesia Other Findings Shunt Malfunction  Reproductive/Obstetrics                            Anesthesia Physical Anesthesia Plan  ASA: 2  Anesthesia Plan: General   Post-op Pain Management:    Induction: Intravenous  PONV Risk Score and Plan: 2 and Ondansetron, Dexamethasone and Treatment may vary due to age or medical condition  Airway Management Planned: Oral ETT  Additional Equipment:   Intra-op Plan:   Post-operative Plan: Extubation in OR  Informed Consent: I have reviewed the patients History and Physical, chart, labs and discussed the procedure including the risks, benefits and alternatives for the proposed anesthesia with the patient or authorized representative who has indicated his/her understanding and acceptance.     Dental advisory given  Plan Discussed with: CRNA  Anesthesia Plan Comments:        Anesthesia Quick Evaluation

## 2021-06-13 NOTE — Plan of Care (Signed)
  Problem: Education: Goal: Knowledge of General Education information will improve Description: Including pain rating scale, medication(s)/side effects and non-pharmacologic comfort measures Outcome: Progressing   Problem: Safety: Goal: Ability to remain free from injury will improve Outcome: Progressing   Problem: Skin Integrity: Goal: Risk for impaired skin integrity will decrease Outcome: Progressing   

## 2021-06-14 ENCOUNTER — Inpatient Hospital Stay (HOSPITAL_COMMUNITY): Payer: Medicaid Other | Admitting: Anesthesiology

## 2021-06-14 ENCOUNTER — Encounter (HOSPITAL_COMMUNITY)
Admission: EM | Disposition: A | Discharge status: Home / Self Care | Payer: Self-pay | Source: Home / Self Care | Attending: Neurological Surgery

## 2021-06-14 HISTORY — PX: VENTRICULOPERITONEAL SHUNT: SHX204

## 2021-06-14 SURGERY — SHUNT INSERTION VENTRICULAR-PERITONEAL
Anesthesia: General | Laterality: Right

## 2021-06-14 MED ORDER — ACETAMINOPHEN 10 MG/ML IV SOLN
INTRAVENOUS | Status: DC | PRN
  Administered 2021-06-14: 1000 mg via INTRAVENOUS

## 2021-06-14 MED ORDER — FENTANYL CITRATE (PF) 100 MCG/2ML IJ SOLN: 25.0000 ug | INTRAMUSCULAR | Status: DC | PRN

## 2021-06-14 MED ORDER — LACTATED RINGERS IV SOLN: INTRAVENOUS | Status: DC | PRN

## 2021-06-14 MED ORDER — SUGAMMADEX SODIUM 200 MG/2ML IV SOLN
INTRAVENOUS | Status: DC | PRN
  Administered 2021-06-14 (×4): 50 mg via INTRAVENOUS

## 2021-06-14 MED ORDER — DEXAMETHASONE SODIUM PHOSPHATE 10 MG/ML IJ SOLN
INTRAMUSCULAR | Status: AC
  Filled 2021-06-14: qty 1

## 2021-06-14 MED ORDER — ROCURONIUM BROMIDE 10 MG/ML (PF) SYRINGE
PREFILLED_SYRINGE | INTRAVENOUS | Status: AC
  Filled 2021-06-14: qty 10

## 2021-06-14 MED ORDER — OXYCODONE HCL 5 MG/5ML PO SOLN
5.0000 mg | Freq: Once | ORAL | Status: DC | PRN
Start: 2021-06-14 — End: 2021-06-14

## 2021-06-14 MED ORDER — MIDAZOLAM HCL 2 MG/2ML IJ SOLN
INTRAMUSCULAR | Status: AC
  Filled 2021-06-14: qty 2

## 2021-06-14 MED ORDER — BACITRACIN ZINC 500 UNIT/GM EX OINT
TOPICAL_OINTMENT | CUTANEOUS | Status: AC
  Filled 2021-06-14: qty 28.35

## 2021-06-14 MED ORDER — MIDAZOLAM HCL 5 MG/5ML IJ SOLN
INTRAMUSCULAR | Status: DC | PRN
  Administered 2021-06-14: 2 mg via INTRAVENOUS

## 2021-06-14 MED ORDER — PROPOFOL 10 MG/ML IV BOLUS
INTRAVENOUS | Status: DC | PRN
  Administered 2021-06-14: 140 mg via INTRAVENOUS

## 2021-06-14 MED ORDER — CEFAZOLIN SODIUM-DEXTROSE 2-4 GM/100ML-% IV SOLN
INTRAVENOUS | Status: AC
  Filled 2021-06-14: qty 100

## 2021-06-14 MED ORDER — THROMBIN 5000 UNITS EX SOLR
OROMUCOSAL | Status: DC | PRN
  Administered 2021-06-14: 5 mL via TOPICAL

## 2021-06-14 MED ORDER — ACETAMINOPHEN 10 MG/ML IV SOLN: 1000.0000 mg | Freq: Once | INTRAVENOUS | Status: DC | PRN

## 2021-06-14 MED ORDER — PROMETHAZINE HCL 25 MG/ML IJ SOLN: 6.2500 mg | INTRAMUSCULAR | Status: DC | PRN

## 2021-06-14 MED ORDER — 0.9 % SODIUM CHLORIDE (POUR BTL) OPTIME
TOPICAL | Status: DC | PRN
  Administered 2021-06-14 (×2): 1000 mL

## 2021-06-14 MED ORDER — CEFAZOLIN SODIUM-DEXTROSE 2-3 GM-%(50ML) IV SOLR
INTRAVENOUS | Status: DC | PRN
  Administered 2021-06-14: 2 g via INTRAVENOUS

## 2021-06-14 MED ORDER — PROPOFOL 10 MG/ML IV BOLUS
INTRAVENOUS | Status: AC
  Filled 2021-06-14: qty 20

## 2021-06-14 MED ORDER — ONDANSETRON HCL 4 MG/2ML IJ SOLN
INTRAMUSCULAR | Status: AC
  Filled 2021-06-14: qty 2

## 2021-06-14 MED ORDER — LIDOCAINE-EPINEPHRINE 1 %-1:100000 IJ SOLN
INTRAMUSCULAR | Status: AC
  Filled 2021-06-14: qty 1

## 2021-06-14 MED ORDER — LIDOCAINE 2% (20 MG/ML) 5 ML SYRINGE
INTRAMUSCULAR | Status: AC
  Filled 2021-06-14: qty 5

## 2021-06-14 MED ORDER — AMISULPRIDE (ANTIEMETIC) 5 MG/2ML IV SOLN: 10.0000 mg | Freq: Once | INTRAVENOUS | Status: DC | PRN

## 2021-06-14 MED ORDER — ROCURONIUM BROMIDE 10 MG/ML (PF) SYRINGE
PREFILLED_SYRINGE | INTRAVENOUS | Status: DC | PRN
  Administered 2021-06-14: 10 mg via INTRAVENOUS
  Administered 2021-06-14: 50 mg via INTRAVENOUS

## 2021-06-14 MED ORDER — LIDOCAINE-EPINEPHRINE 1 %-1:100000 IJ SOLN
INTRAMUSCULAR | Status: DC | PRN
  Administered 2021-06-14: 10 mL

## 2021-06-14 MED ORDER — THROMBIN 5000 UNITS EX SOLR
CUTANEOUS | Status: AC
  Filled 2021-06-14: qty 5000

## 2021-06-14 MED ORDER — FENTANYL CITRATE (PF) 250 MCG/5ML IJ SOLN
INTRAMUSCULAR | Status: DC | PRN
  Administered 2021-06-14: 100 ug via INTRAVENOUS
  Administered 2021-06-14: 50 ug via INTRAVENOUS

## 2021-06-14 MED ORDER — LIDOCAINE 2% (20 MG/ML) 5 ML SYRINGE
INTRAMUSCULAR | Status: DC | PRN
  Administered 2021-06-14: 100 mg via INTRAVENOUS

## 2021-06-14 MED ORDER — DEXAMETHASONE SODIUM PHOSPHATE 10 MG/ML IJ SOLN
INTRAMUSCULAR | Status: DC | PRN
  Administered 2021-06-14: 10 mg via INTRAVENOUS

## 2021-06-14 MED ORDER — OXYCODONE HCL 5 MG PO TABS
5.0000 mg | ORAL_TABLET | Freq: Once | ORAL | Status: DC | PRN
Start: 2021-06-14 — End: 2021-06-14

## 2021-06-14 MED ORDER — FENTANYL CITRATE (PF) 250 MCG/5ML IJ SOLN
INTRAMUSCULAR | Status: AC
  Filled 2021-06-14: qty 5

## 2021-06-14 MED ORDER — ACETAMINOPHEN 10 MG/ML IV SOLN
INTRAVENOUS | Status: AC
  Filled 2021-06-14: qty 100

## 2021-06-14 MED ORDER — ONDANSETRON HCL 4 MG/2ML IJ SOLN
INTRAMUSCULAR | Status: DC | PRN
  Administered 2021-06-14: 4 mg via INTRAVENOUS

## 2021-06-14 SURGICAL SUPPLY — 62 items
ADH SKN CLS APL DERMABOND .7 (GAUZE/BANDAGES/DRESSINGS) ×2
BAG COUNTER SPONGE SURGICOUNT (BAG) ×2 IMPLANT
BAG SPNG CNTER NS LX DISP (BAG) ×1
BLADE CLIPPER SURG (BLADE) ×2 IMPLANT
BLADE SURG 11 STRL SS (BLADE) ×4 IMPLANT
BOOT SUTURE AID YELLOW STND (SUTURE) ×1 IMPLANT
BUR ACORN 9.0 PRECISION (BURR) ×1 IMPLANT
CANISTER SUCT 3000ML PPV (MISCELLANEOUS) ×2 IMPLANT
CLIP RANEY DISP (INSTRUMENTS) IMPLANT
DECANTER SPIKE VIAL GLASS SM (MISCELLANEOUS) ×1 IMPLANT
DERMABOND ADVANCED (GAUZE/BANDAGES/DRESSINGS) ×2
DERMABOND ADVANCED .7 DNX12 (GAUZE/BANDAGES/DRESSINGS) ×1 IMPLANT
DRAPE HALF SHEET 40X57 (DRAPES) ×2 IMPLANT
DRAPE INCISE IOBAN 66X45 STRL (DRAPES) ×3 IMPLANT
DRAPE ORTHO SPLIT 77X108 STRL (DRAPES) ×4
DRAPE SURG ORHT 6 SPLT 77X108 (DRAPES) ×2 IMPLANT
DRAPE WARM FLUID 44X44 (DRAPES) ×1 IMPLANT
DURAPREP 26ML APPLICATOR (WOUND CARE) ×4 IMPLANT
ELECT REM PT RETURN 9FT ADLT (ELECTROSURGICAL) ×2
ELECTRODE REM PT RTRN 9FT ADLT (ELECTROSURGICAL) ×1 IMPLANT
GAUZE 4X4 16PLY ~~LOC~~+RFID DBL (SPONGE) IMPLANT
GLOVE EXAM NITRILE XL STR (GLOVE) IMPLANT
GLOVE SURG LTX SZ7.5 (GLOVE) ×4 IMPLANT
GLOVE SURG UNDER LTX SZ7.5 (GLOVE) ×2 IMPLANT
GLOVE SURG UNDER POLY LF SZ7 (GLOVE) ×2 IMPLANT
GOWN STRL REUS W/ TWL LRG LVL3 (GOWN DISPOSABLE) ×2 IMPLANT
GOWN STRL REUS W/ TWL XL LVL3 (GOWN DISPOSABLE) IMPLANT
GOWN STRL REUS W/TWL 2XL LVL3 (GOWN DISPOSABLE) IMPLANT
GOWN STRL REUS W/TWL LRG LVL3 (GOWN DISPOSABLE) ×4
GOWN STRL REUS W/TWL XL LVL3 (GOWN DISPOSABLE)
HEMOSTAT POWDER KIT SURGIFOAM (HEMOSTASIS) ×2 IMPLANT
HEMOSTAT SURGICEL 2X14 (HEMOSTASIS) IMPLANT
KIT BASIN OR (CUSTOM PROCEDURE TRAY) ×2 IMPLANT
KIT TURNOVER KIT B (KITS) ×2 IMPLANT
MARKER SKIN DUAL TIP RULER LAB (MISCELLANEOUS) IMPLANT
NDL HYPO 18GX1.5 BLUNT FILL (NEEDLE) IMPLANT
NEEDLE HYPO 18GX1.5 BLUNT FILL (NEEDLE) ×2 IMPLANT
NEEDLE HYPO 22GX1.5 SAFETY (NEEDLE) ×2 IMPLANT
NS IRRIG 1000ML POUR BTL (IV SOLUTION) ×3 IMPLANT
PACK LAMINECTOMY NEURO (CUSTOM PROCEDURE TRAY) ×2 IMPLANT
PAD ARMBOARD 7.5X6 YLW CONV (MISCELLANEOUS) ×6 IMPLANT
PASSER CATH 65CM DISP (NEUROSURGERY SUPPLIES) ×1 IMPLANT
SHEATH PERITONEAL INTRO 61 (SHEATH) ×2 IMPLANT
SPONGE T-LAP 4X18 ~~LOC~~+RFID (SPONGE) IMPLANT
STAPLER SKIN PROX WIDE 3.9 (STAPLE) ×2 IMPLANT
STRIP CLOSURE SKIN 1/2X4 (GAUZE/BANDAGES/DRESSINGS) IMPLANT
SUT ETHILON 3 0 FSL (SUTURE) IMPLANT
SUT MON AB 3-0 SH 27 (SUTURE) ×2
SUT MON AB 3-0 SH27 (SUTURE) IMPLANT
SUT NURALON 4 0 TR CR/8 (SUTURE) ×1 IMPLANT
SUT SILK 0 TIES 10X30 (SUTURE) IMPLANT
SUT SILK 2 0 TIES 10X30 (SUTURE) ×1 IMPLANT
SUT SILK 3 0 SH 30 (SUTURE) IMPLANT
SUT VIC AB 2-0 CP2 18 (SUTURE) ×2 IMPLANT
SUT VIC AB 3-0 SH 8-18 (SUTURE) ×3 IMPLANT
TOWEL GREEN STERILE (TOWEL DISPOSABLE) ×4 IMPLANT
TOWEL GREEN STERILE FF (TOWEL DISPOSABLE) ×2 IMPLANT
TRAY LUMBAR PUNCTURE AD (SET/KITS/TRAYS/PACK) ×1 IMPLANT
TUBE CONNECTING 12X1/4 (SUCTIONS) IMPLANT
UNDERPAD 30X36 HEAVY ABSORB (UNDERPADS AND DIAPERS) ×2 IMPLANT
VALVE CERTAS PLUS CSF NON-SIPH (Valve) ×1 IMPLANT
WATER STERILE IRR 1000ML POUR (IV SOLUTION) ×2 IMPLANT

## 2021-06-14 NOTE — Anesthesia Procedure Notes (Signed)
Procedure Name: Intubation Date/Time: 06/14/2021 7:50 AM Performed by: Adria Dill, CRNA Pre-anesthesia Checklist: Patient identified, Emergency Drugs available, Suction available and Patient being monitored Patient Re-evaluated:Patient Re-evaluated prior to induction Oxygen Delivery Method: Circle system utilized Preoxygenation: Pre-oxygenation with 100% oxygen Induction Type: IV induction Ventilation: Mask ventilation without difficulty Laryngoscope Size: Miller and 3 Grade View: Grade I Tube type: Oral Tube size: 7.0 mm Number of attempts: 1 Airway Equipment and Method: Stylet and Oral airway Placement Confirmation: ETT inserted through vocal cords under direct vision, positive ETCO2 and breath sounds checked- equal and bilateral Secured at: 21 cm Tube secured with: Tape Dental Injury: Teeth and Oropharynx as per pre-operative assessment

## 2021-06-14 NOTE — Anesthesia Postprocedure Evaluation (Signed)
Anesthesia Post Note  Patient: Brooke Carrillo  Procedure(s) Performed: SHUNT REVISION VENTRICULAR-PERITONEAL (Right)     Patient location during evaluation: PACU Anesthesia Type: General Level of consciousness: awake Pain management: pain level controlled Vital Signs Assessment: post-procedure vital signs reviewed and stable Respiratory status: spontaneous breathing, nonlabored ventilation, respiratory function stable and patient connected to nasal cannula oxygen Cardiovascular status: blood pressure returned to baseline and stable Postop Assessment: no apparent nausea or vomiting Anesthetic complications: no   No notable events documented.  Last Vitals:  Vitals:   06/14/21 1629 06/14/21 2004  BP: 114/72 127/72  Pulse: 65 79  Resp: 17 15  Temp: 36.5 C 37.1 C  SpO2: 98% 100%    Last Pain:  Vitals:   06/14/21 2004  TempSrc: Oral  PainSc: 8                  Recie Cirrincione P [name]

## 2021-06-14 NOTE — Op Note (Signed)
PATIENT: Brooke Carrillo  DAY OF SURGERY: 06/14/21   PRE-OPERATIVE DIAGNOSIS:  Communicating hydrocephalus   POST-OPERATIVE DIAGNOSIS:  Same   PROCEDURE:  RIght ventriculoperitoneal shunt revision   SURGEON:  Surgeon(s) and Role:    Jadene Pierini, MD - Primary   ANESTHESIA: ETGA   BRIEF HISTORY: This is a 35 year old woman who presented with a large tense pseudomeningocele after chiari decompression with headaches and nausea. She had a shunt placed, which resolved her symptoms, then returned with similar symptoms, consistent with a shunt malfunction. CSF taps did not show any evidence of infection, I therefore recommended shunt revision. This was discussed with the patient as well as risks, benefits, and alternatives and wished to proceed with surgery.   OPERATIVE DETAIL: The patient was taken to the operating room and placed on the OR table in the supine position. A formal time out was performed with two patient identifiers and confirmed the operative site. Anesthesia was induced by the anesthesia team. The head was turned contralaterally and a bump was placed under the ipsilateral shoulder to assist with tunneling. The operative sites were marked, hair was clipped with surgical clippers, and the area was then prepped and draped in a sterile fashion along the entire length of the shunt system. The prior scalp incision was reopened and the proximal hardware was exposed. There was CSF in the cavity around the shunt valve. The proximal catheter was disconnected from the valve and there was excellent flow. A manometer was used to check distal catheter runoff with very poor flow. It was flushed with saline and the obstruction resolved with very good distal runoff. On inspection of the valve, there was detritus in the valve that I could not flush out, so I replaced the valve as well to prevent stopped flow or embolization into the distal catheter and obstruction. The shunt valve was replaced with  another Certas set to performance level 3.0. I opened the abdominal incision superficially to pull down extra distal catheter to prevent kinks and reinserted the shunt. The incisions were copiously irrigated, all counts were correct, and the incisions were closed in layers. The patient was then returned to anesthesia for emergence. No apparent complications at the completion of the procedure.  IMPLANTS: Certas shunt valve, set to performance level 3.0   EBL:  14mL   DRAINS: none   SPECIMENS: none   Jadene Pierini, MD 06/14/21 7:22 AM

## 2021-06-14 NOTE — Progress Notes (Signed)
Neurosurgery Service Progress Note  Subjective: No acute events overnight, headache and neck pain from the pseudomeningocele but otherwise doing well   Objective: Vitals:   06/13/21 0552 06/13/21 1224 06/13/21 2319 06/14/21 0509  BP: 106/66 (!) 123/59 104/62 107/64  Pulse: 89 84 70 74  Resp: 18 18 16 17   Temp: 98.4 F (36.9 C) 98.2 F (36.8 C) 98.6 F (37 C) 98.3 F (36.8 C)  TempSrc: Oral Oral Oral Oral  SpO2: 96% 98% 99% 97%  Weight:  67.4 kg    Height:        Physical Exam: AOx3, PERRL, EOMI, FS, TM, Strength 5/5 x4, SILTx4, no drift Pseudomeningocele tense  Assessment & Plan: 35 y.o. woman s/p chiari decompression, hydrocephalus s/p VPS placement, p/w enlarging pseudomeningocele, 9/12 Certas dialed 3-->2, 9/13 dialed 2-->1 and pseudomeningocele tapped  -OR this morning for shunt revision, will dial back up to 3 as well  10/13  06/14/21 7:21 AM

## 2021-06-14 NOTE — Transfer of Care (Signed)
Immediate Anesthesia Transfer of Care Note  Patient: Brooke Carrillo  Procedure(s) Performed: SHUNT REVISION VENTRICULAR-PERITONEAL (Right)  Patient Location: PACU  Anesthesia Type:General  Level of Consciousness: awake and patient cooperative  Airway & Oxygen Therapy: Patient Spontanous Breathing  Post-op Assessment: Report given to RN and Post -op Vital signs reviewed and stable  Post vital signs: Reviewed and stable  Last Vitals:  Vitals Value Taken Time  BP 123/60 06/14/21 0913  Temp 36.2 C 06/14/21 0915  Pulse 79 06/14/21 0915  Resp 13 06/14/21 0915  SpO2 93 % 06/14/21 0915  Vitals shown include unvalidated device data.  Last Pain:  Vitals:   06/14/21 0509  TempSrc: Oral  PainSc:          Complications: No notable events documented.

## 2021-06-15 LAB — CSF CULTURE W GRAM STAIN: Culture: NO GROWTH

## 2021-06-15 NOTE — Progress Notes (Signed)
  NEUROSURGERY PROGRESS NOTE   No issues overnight. Pt reports feeling ok, minimal HA but feels her midline wound may be a little "swollen."  EXAM:  BP 96/60 (BP Location: Right Arm)   Pulse 62   Temp 98.2 F (36.8 C) (Oral)   Resp 16   Ht 5\' 3"  (1.6 m)   Wt 67.4 kg   LMP  (LMP Unknown)   SpO2 98%   BMI 26.32 kg/m   Awake, alert, oriented  Speech fluent, appropriate  CN grossly intact  5/5 BUE/BLE  Wounds c/d/I. Mildly boggy midline incision  IMPRESSION:  35 y.o. female s/p VPS for pseudomeningocele after Chiari decompression. Pt prefers to stay in-house for observation one more day  PLAN: - Will cont to monitor wound - hopefully d/c tomorrow    20, MD [facility]

## 2021-06-16 MED ORDER — OXYCODONE HCL 5 MG PO TABS: 5.0000 mg | ORAL_TABLET | ORAL | 0 refills | Status: AC | PRN

## 2021-06-16 NOTE — Plan of Care (Signed)
  Problem: Education: Goal: Knowledge of General Education information will improve Description: Including pain rating scale, medication(s)/side effects and non-pharmacologic comfort measures Outcome: Adequate for Discharge   

## 2021-06-16 NOTE — Discharge Summary (Signed)
  Physician Discharge Summary  Patient ID: MERLEEN Carrillo MRN: [id number] DOB/AGE: July 06, 1986 35 y.o.  Admit date: 06/09/2021 Discharge date: 06/16/2021  Admission Diagnoses:  Shunt malfunction  Discharge Diagnoses:  Same Active Problems:   Shunt malfunction   Discharged Condition: Stable  Hospital Course:  Brooke Carrillo is a 35 y.o. female admitted for development of pseudomeningocele after chiari decompression. She ultimately underwent revision of her VPS. She was monitored without further accumulation of psuedomeningocele. She therefore requested d/c.  Treatments: Surgery - VPS revision  Discharge Exam: Blood pressure 116/71, pulse 70, temperature 98.4 F (36.9 C), temperature source Oral, resp. rate 18, height 5\' 3"  (1.6 m), weight 67.4 kg, SpO2 100 %. Awake, alert, oriented Speech fluent, appropriate CN grossly intact 5/5 BUE/BLE Wound c/d/i  Disposition: Discharge disposition: 01-Home or Self Care      Discharge Instructions     Call MD for:  redness, tenderness, or signs of infection (pain, swelling, redness, odor or green/yellow discharge around incision site)   Complete by: As directed    Call MD for:  temperature >100.4   Complete by: As directed    Diet - low sodium heart healthy   Complete by: As directed    Discharge instructions   Complete by: As directed    Walk at home as much as possible, at least 4 times / day   Increase activity slowly   Complete by: As directed    Lifting restrictions   Complete by: As directed    No lifting > 10 lbs   May shower / Bathe   Complete by: As directed    48 hours after surgery   May walk up steps   Complete by: As directed    No dressing needed   Complete by: As directed    Other Restrictions   Complete by: As directed    No bending/twisting at waist      Allergies as of 06/16/2021   No Known Allergies      Medication List     TAKE these medications    acetaminophen 325 MG tablet Commonly known  as: TYLENOL Take 650 mg by mouth every 6 (six) hours as needed for moderate pain or headache.   cyclobenzaprine 10 MG tablet Commonly known as: FLEXERIL Take 1 tablet (10 mg total) by mouth 3 (three) times daily as needed for muscle spasms.   oxyCODONE 5 MG immediate release tablet Commonly known as: Oxy IR/ROXICODONE Take 1 tablet (5 mg total) by mouth every 4 (four) hours as needed (pain).   prochlorperazine 10 MG tablet Commonly known as: COMPAZINE Take 1 tablet (10 mg total) by mouth 2 (two) times daily as needed for nausea or vomiting (headache).               Discharge Care Instructions  (From admission, onward)           Start     Ordered   06/16/21 0000  No dressing needed        06/16/21 1055            Follow-up Information     06/18/21, MD Follow up in 2 week(s).   Specialty: Neurosurgery Contact information: [address] [place] [phone]                 Signed: [phone] 06/16/2021, 10:59 AM

## 2021-06-16 NOTE — Progress Notes (Signed)
  NEUROSURGERY PROGRESS NOTE   No issues overnight. Pt without complaint this am. No subjective worsening in wound  EXAM:  BP 116/71 (BP Location: Right Arm)   Pulse 70   Temp 98.4 F (36.9 C) (Oral)   Resp 18   Ht 5\' 3"  (1.6 m)   Wt 67.4 kg   LMP  (LMP Unknown)   SpO2 100%   BMI 26.32 kg/m   Awake, alert, oriented  Speech fluent, appropriate  CN grossly intact  5/5 BUE/BLE  Wound stable, minimally boggy. No drainage  IMPRESSION:  35 y.o. female s/p VPS for pseudomeningocele after Chiari. Doing well, no clinical recurrence of meningocele.  PLAN: - d/c home today   20, MD [facility]

## 2021-06-17 ENCOUNTER — Encounter (HOSPITAL_COMMUNITY): Payer: Self-pay | Admitting: Neurological Surgery

## 2021-06-17 ENCOUNTER — Telehealth: Payer: Self-pay

## 2021-06-17 NOTE — Telephone Encounter (Signed)
Transition Care Management  Follow-up Telephone Call  Date of discharge and from where:  [facility] on 06/16/2021  Reached pt stated she will call the office tomorrow and schedule appt with PCP . Said she would prefer to call back tomorrow and discuss HFU

## 2021-06-21 ENCOUNTER — Other Ambulatory Visit: Payer: Self-pay | Admitting: Neurological Surgery

## 2021-06-21 DIAGNOSIS — G935 Compression of brain: Secondary | ICD-10-CM

## 2021-06-26 ENCOUNTER — Ambulatory Visit
Admission: RE | Admit: 2021-06-26 | Discharge: 2021-06-26 | Disposition: A | Discharge status: Home / Self Care | Payer: Medicaid Other | Source: Ambulatory Visit | Attending: Neurological Surgery | Admitting: Neurological Surgery

## 2021-06-26 ENCOUNTER — Other Ambulatory Visit: Payer: Self-pay

## 2021-06-26 DIAGNOSIS — G935 Compression of brain: Secondary | ICD-10-CM

## 2021-07-01 ENCOUNTER — Other Ambulatory Visit: Payer: Self-pay | Admitting: Neurological Surgery

## 2021-07-01 DIAGNOSIS — G935 Compression of brain: Secondary | ICD-10-CM

## 2021-07-18 NOTE — Progress Notes (Deleted)
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  ***  Subjective:  Brooke Carrillo is a 35 year old right-handed female who follows up for Chiari Malformation, type 1.  UPDATE: She followed up with neurosurgery and underwent Chiari decompression on 04/08/2021.  She subsequently started having headaches and was found to have subdural hygromas and pseudomeningocele.  Headaches continued to be severe and due to worsening subdural hygromas, she underwent right VP shunt on 06/04/2021.  ***   HISTORY: In November 2021, she woke up on morning with numbness in her right arm.  She was told it was likely a pinched nerve in the neck.  She then developed numbness in the left hand followed by numbness in both legs.  Sometimes she notes weakness of the extremities as well.  Balance is off.  She then developed a severe posterior pounding headache lasting all day and occurring about once a week.  No prior history of headaches.  Ibuprofen 600mg  is ineffective.  Headaches are aggravated by valsalva, such as straining on the commode.  She has had brief episodes of whiteout of vision lasting 10-15 seconds and not accompanied by headache.  On a couple of occasions she reports some difficulty swallowing or hoarse voice.   She also had been experiencing chest pain on and off.  Cardiology workup was unremarkable  She went to the ED on 12/08/2020.  MRI of brain without contrast showed a Chiari 1 malformation with cerebellar tonsils extending 9 mm below foramen magnum.    Current NSAIDS/analgesics:  Ibuprofen 600mg  Current triptans:  none Current ergotamine:  none Current anti-emetic:  none Current muscle relaxants:  none Current Antihypertensive medications:  none Current Antidepressant medications:  none Current Anticonvulsant medications:   none Current anti-CGRP:  none Current Vitamins/Herbal/Supplements:  none Current Antihistamines/Decongestants:  none Other therapy:  none Hormone/birth control:  none  PAST MEDICAL HISTORY: Past Medical History:  Diagnosis Date   Headache    Pregnancy as incidental finding     MEDICATIONS: Current Outpatient Medications on File Prior to Visit  Medication Sig Dispense Refill   acetaminophen (TYLENOL) 325 MG tablet Take 650 mg by mouth every 6 (six) hours as needed for moderate pain or headache.     cyclobenzaprine (FLEXERIL) 10 MG tablet Take 1 tablet (10 mg total) by mouth 3 (three) times daily as needed for muscle spasms. (Patient not taking: Reported on 06/10/2021) 30 tablet 0   oxyCODONE (OXY IR/ROXICODONE) 5 MG immediate release tablet Take 1 tablet (5 mg total) by mouth every 4 (four) hours as needed (pain). 30 tablet 0   prochlorperazine (COMPAZINE) 10 MG tablet Take 1 tablet (10 mg total) by mouth 2 (two) times daily as needed for nausea or vomiting (headache). (Patient not taking: Reported on 06/10/2021) 20 tablet 0   No current facility-administered medications on file prior to visit.    ALLERGIES: No Known Allergies  FAMILY HISTORY: Family History  Problem Relation Age of Onset   Hypertension Father    Stroke Father    Heart disease Father    Dementia Mother    Cancer Maternal Aunt        Breast   HIV Maternal Aunt    Stroke Maternal Uncle    Cancer Paternal Uncle        Colon   Cancer Maternal Grandmother  Hypertension Paternal Grandmother    Cancer Paternal Grandfather        Colon      Objective:  *** General: No acute distress.  Patient appears ***-groomed.   Head:  Normocephalic/atraumatic Eyes:  Fundi examined but not visualized Neck: supple, no paraspinal tenderness, full range of motion Heart:  Regular rate and rhythm Lungs:  Clear to auscultation bilaterally Back: No paraspinal tenderness Neurological Exam: alert and oriented to person,  place, and time.  Speech fluent and not dysarthric, language intact.  CN II-XII intact. Bulk and tone normal, muscle strength 5/5 throughout.  Sensation to light touch intact.  Deep tendon reflexes 2+ throughout, toes downgoing.  Finger to nose testing intact.  Gait normal, Romberg negative.   Shon Millet, DO  CC: ***

## 2021-07-22 ENCOUNTER — Ambulatory Visit: Payer: Medicaid Other | Admitting: Neurology

## 2022-11-29 IMAGING — DX DG CHEST 2V
2 series · 2 of 2 positions shown · non-contrast
Comparison: October 24, 2020

CLINICAL DATA: Chest pain

EXAM:
CHEST - 2 VIEW

[chest pa]
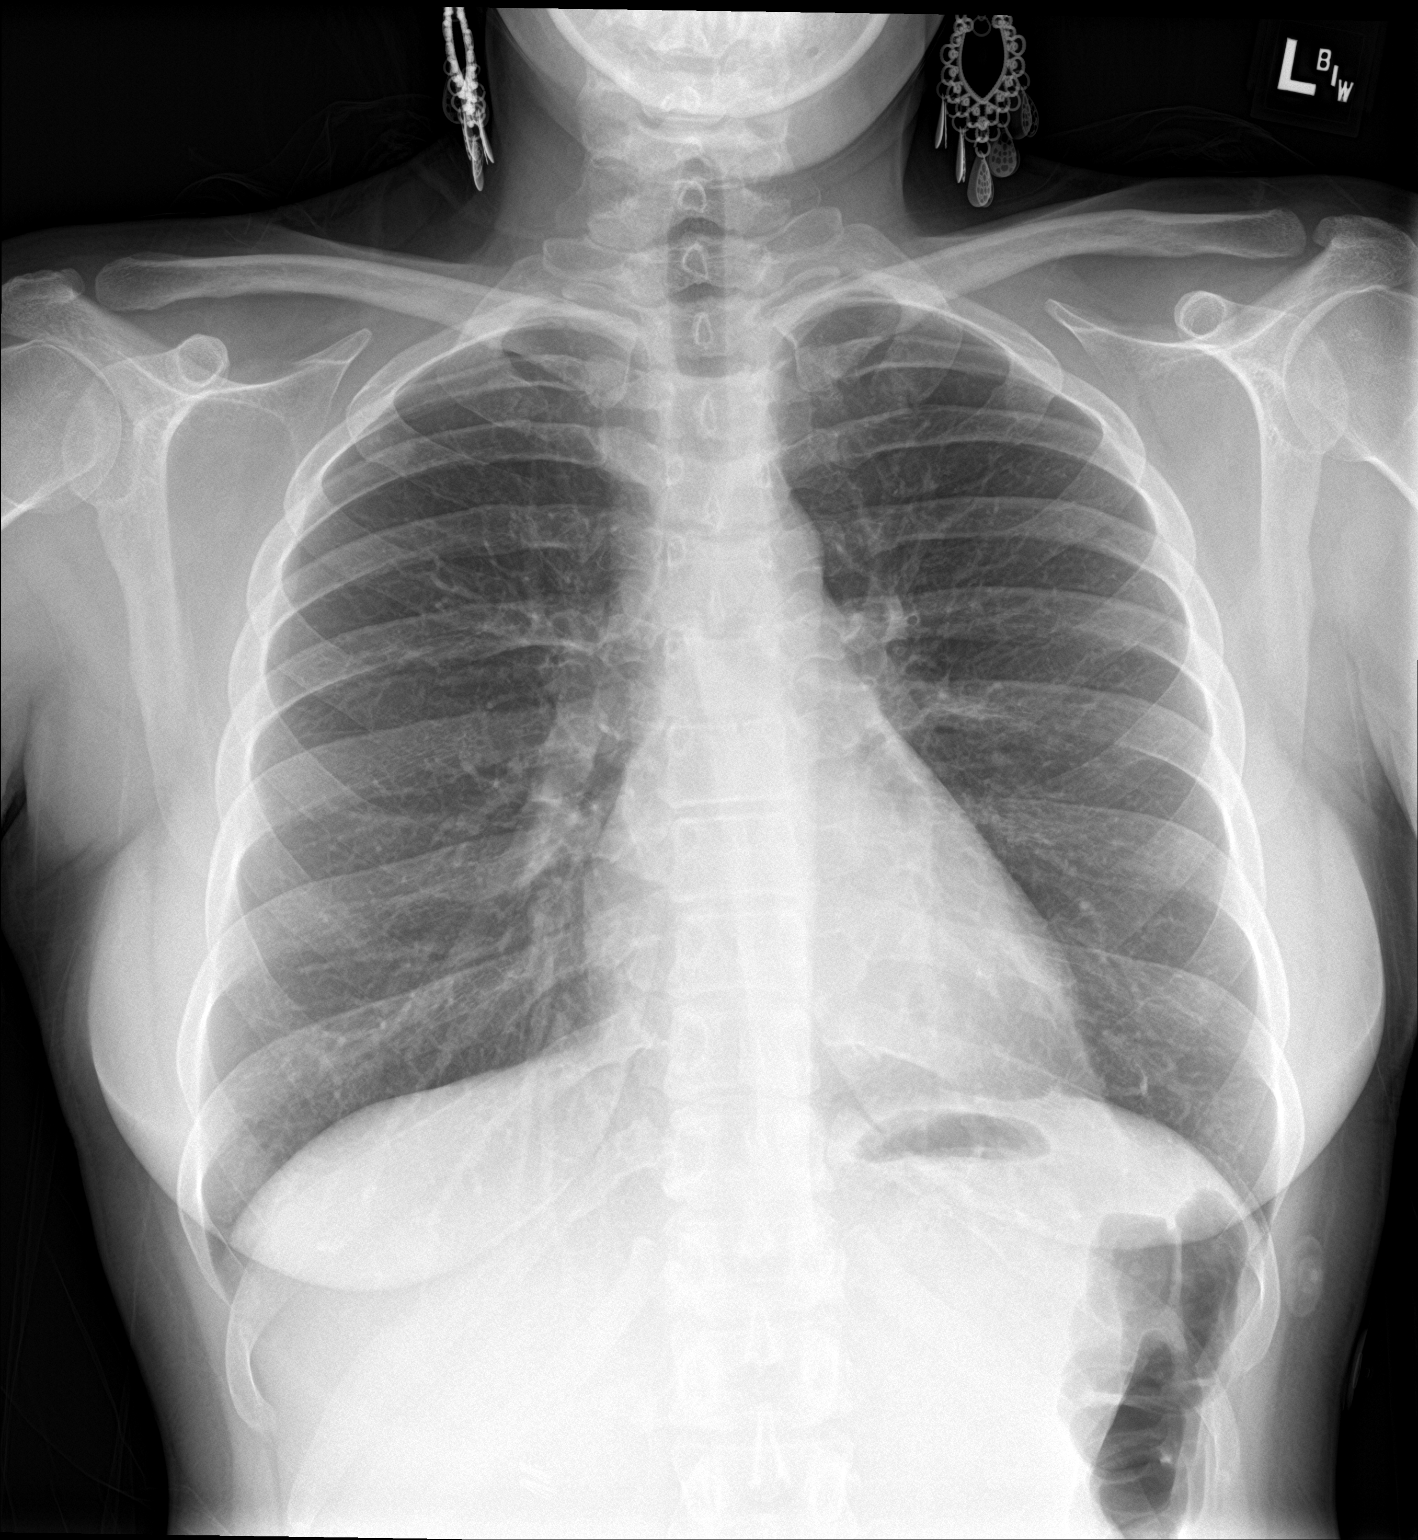

[chest lat]
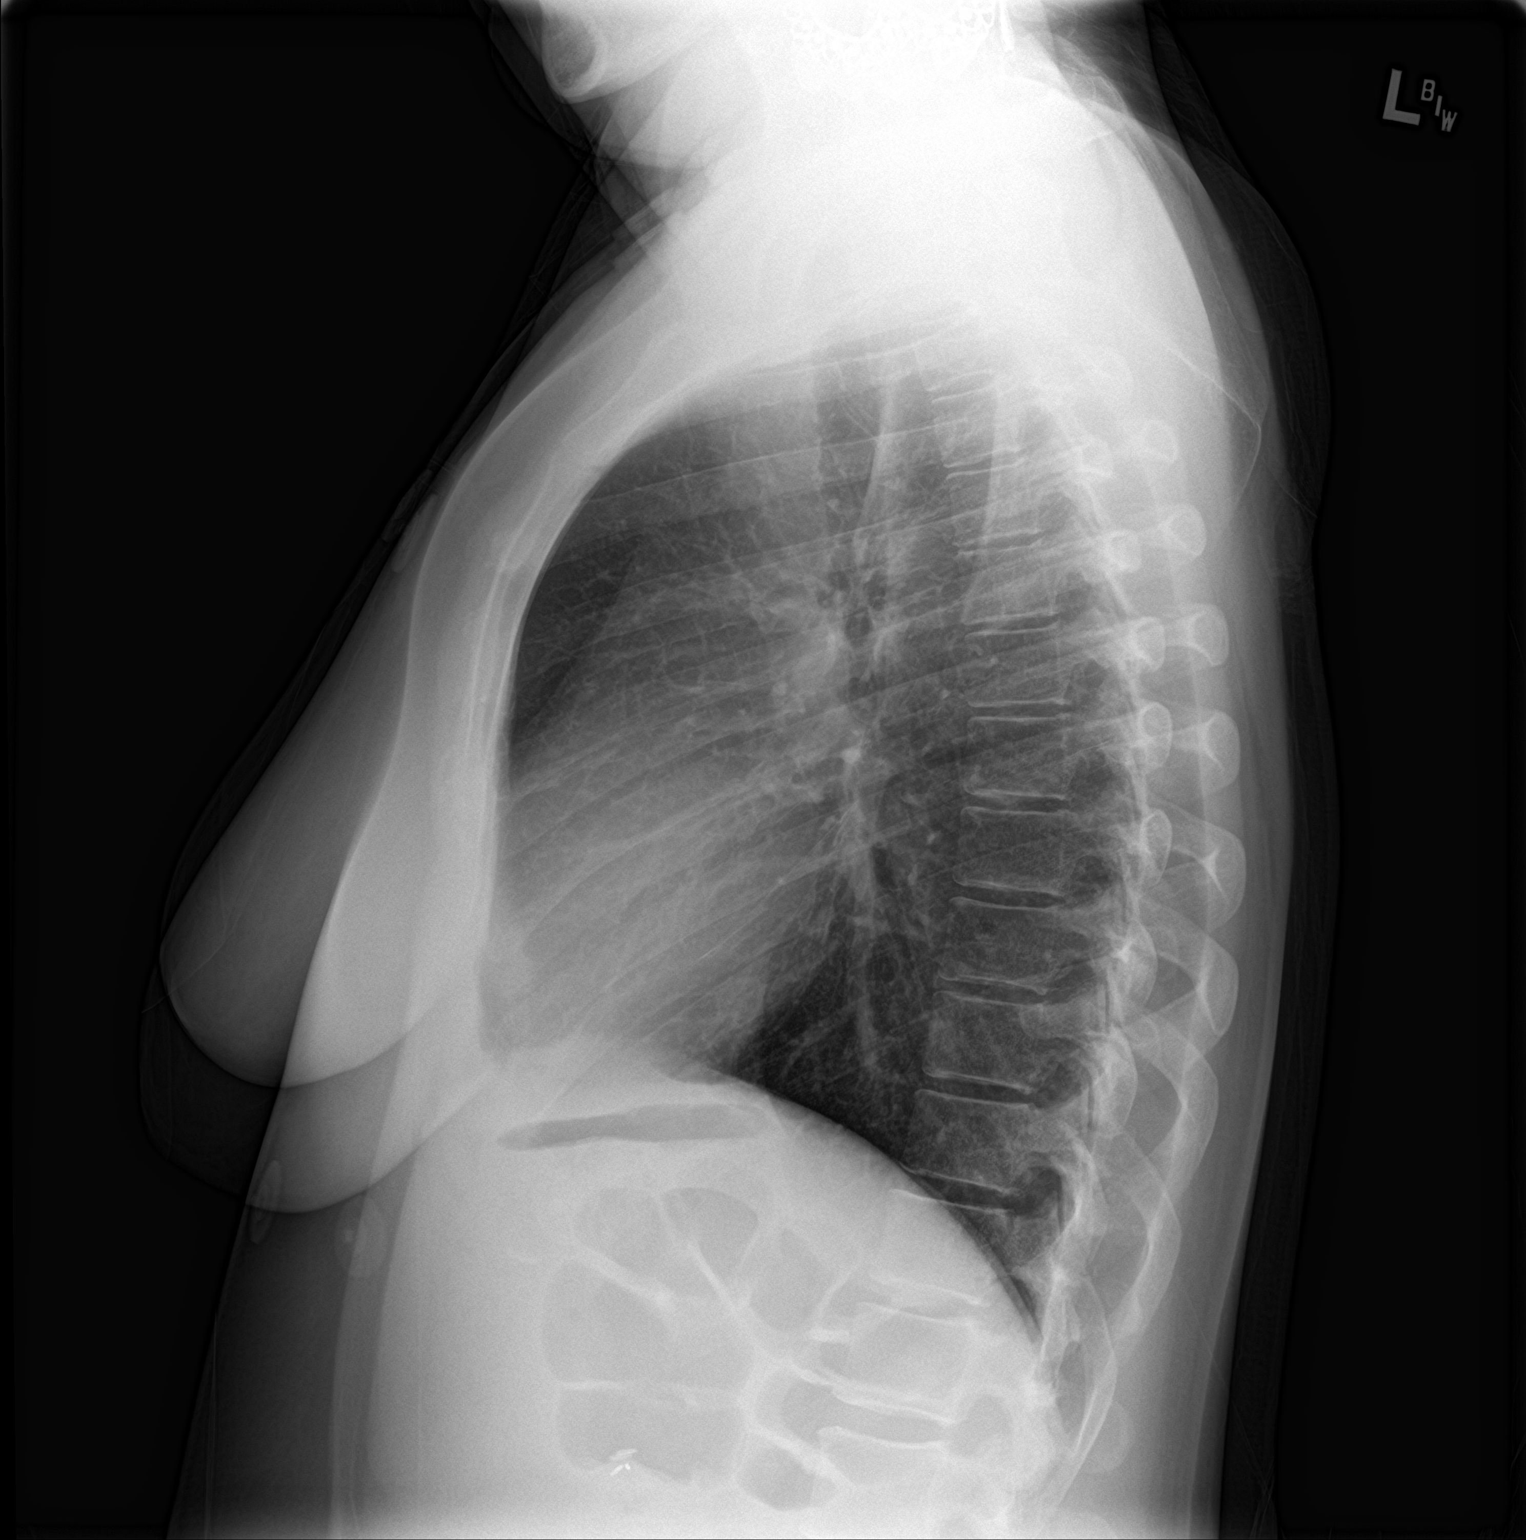

[2 of 2 positions shown; findings below may reference images not displayed]

FINDINGS: The cardiomediastinal silhouette is normal in contour. No pleural
effusion. No pneumothorax. No acute pleuroparenchymal abnormality.
Status post cholecystectomy. No acute osseous abnormality noted.
IMPRESSION: No acute cardiopulmonary abnormality.

## 2023-05-28 IMAGING — CT CT HEAD W/O CM
4 series · 15 of 47 positions shown, 17 images · non-contrast
Comparison: None.

CLINICAL DATA: Recent posterior fossa decompression with headache,
nausea and vomiting.

EXAM:
CT HEAD WITHOUT CONTRAST
CT CERVICAL SPINE WITHOUT CONTRAST
TECHNIQUE: Multidetector CT imaging of the head and cervical spine was
performed following the standard protocol without intravenous
contrast. Multiplanar CT image reconstructions of the cervical spine
were also generated.

[Series 3: head wo · axial · 0.41mm/px · z∈[+1216,+1336]mm · 7 of 32 slices shown, 9 images]
[im 4/32  brain]
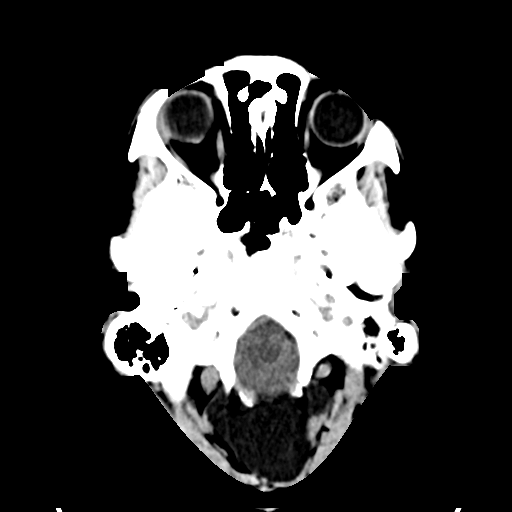
[im 4/32  bone]
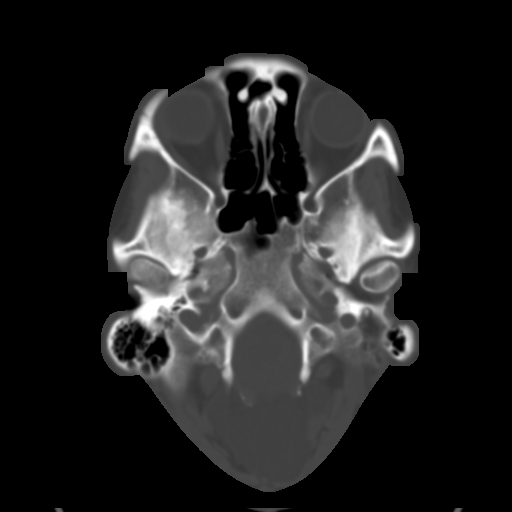
[im 8/32  brain]
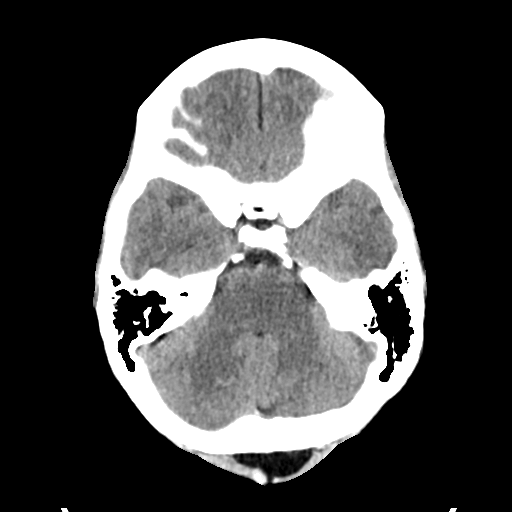
[im 12/32  brain]
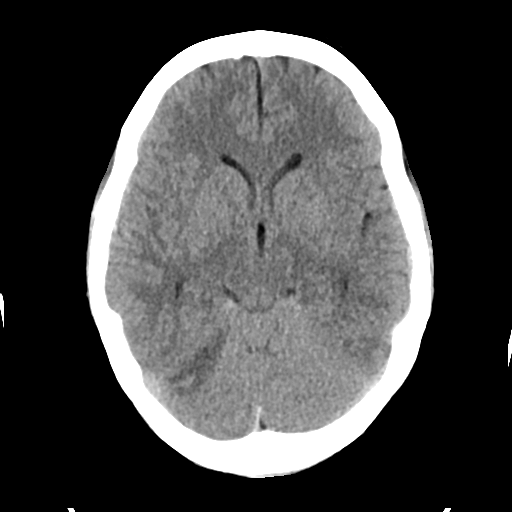
[im 16/32  brain]
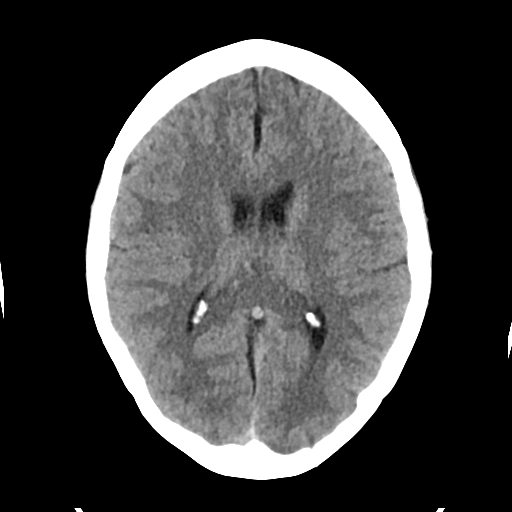
[im 20/32  brain]
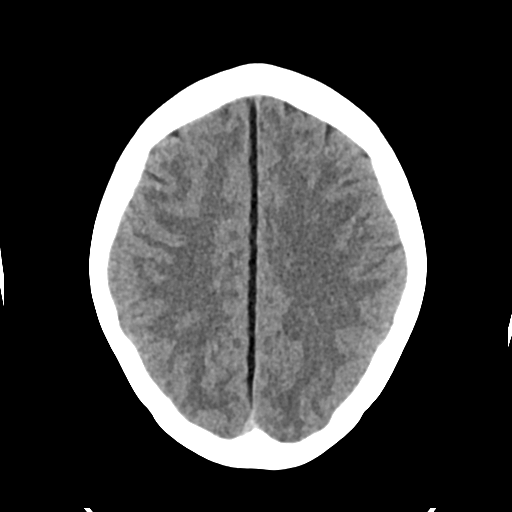
[im 20/32  bone]
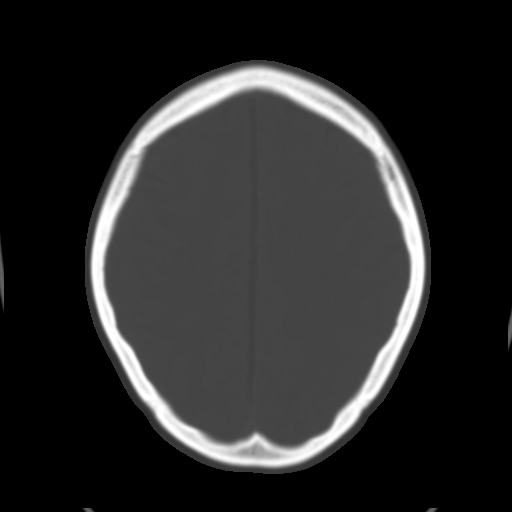
[im 24/32  brain]
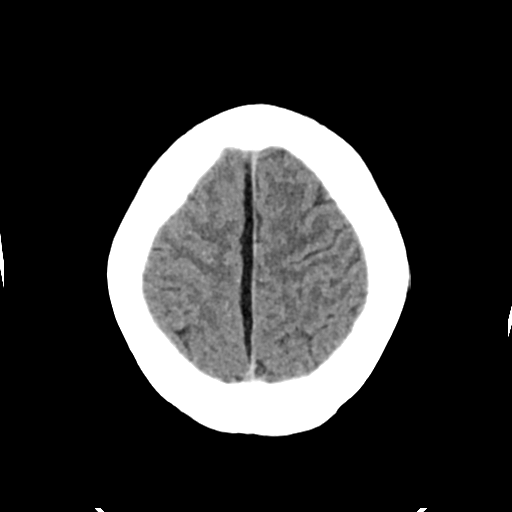
[im 28/32  brain]
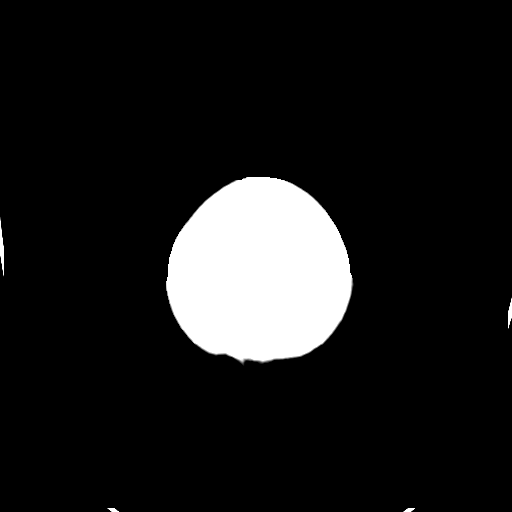

[Series 4: head bone · axial · 0.41mm/px · z∈[+1216,+1232]mm · 2 of 78 slices shown]
[im 8/78  bone]
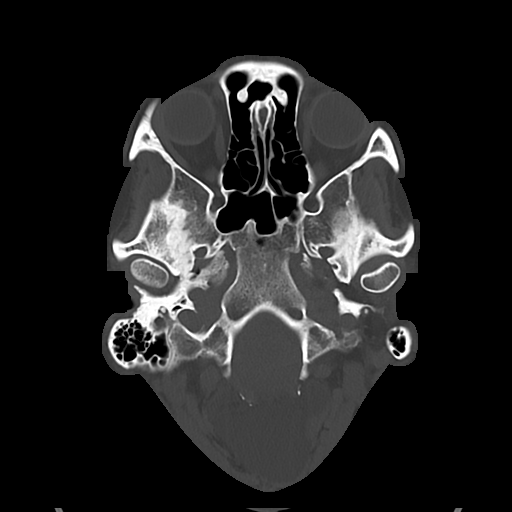
[im 16/78  bone]
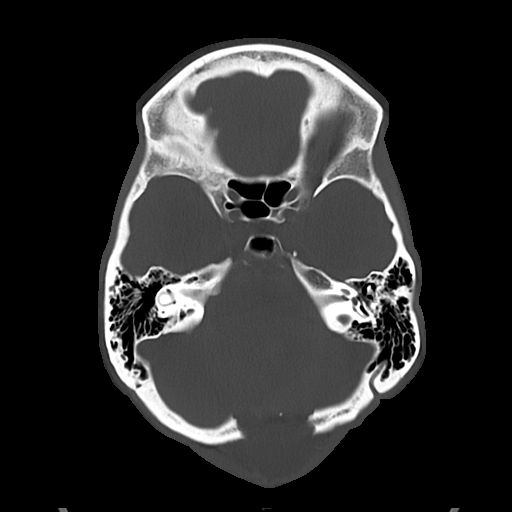

[Series 5: cor soft · coronal · 0.31mm/px · 3 of 71 slices shown]
[im 24/71  brain]
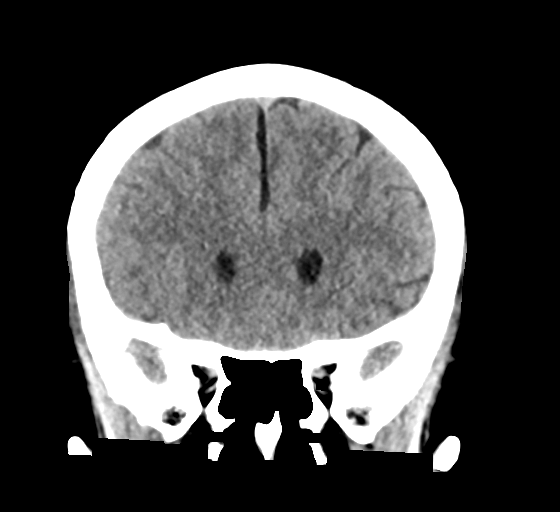
[im 32/71  brain]
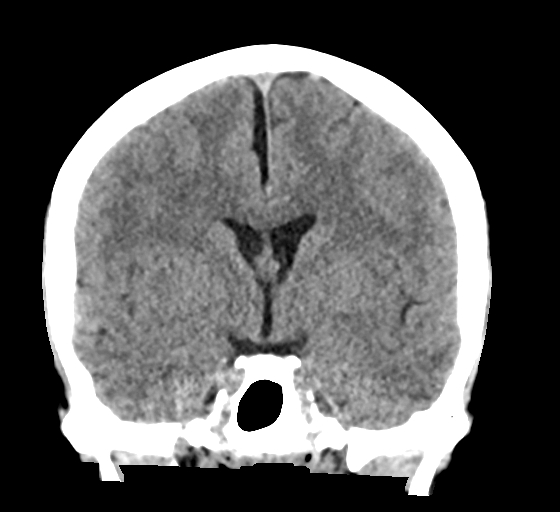
[im 39/71  brain]
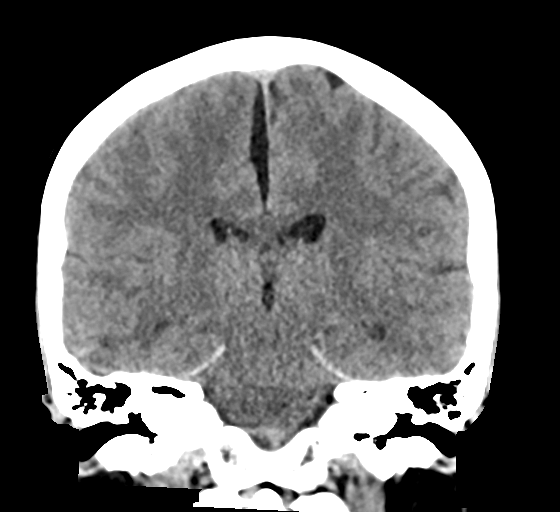

[Series 6: sag soft · sagittal · 0.31mm/px · 3 of 58 slices shown]
[im 20/58  brain]
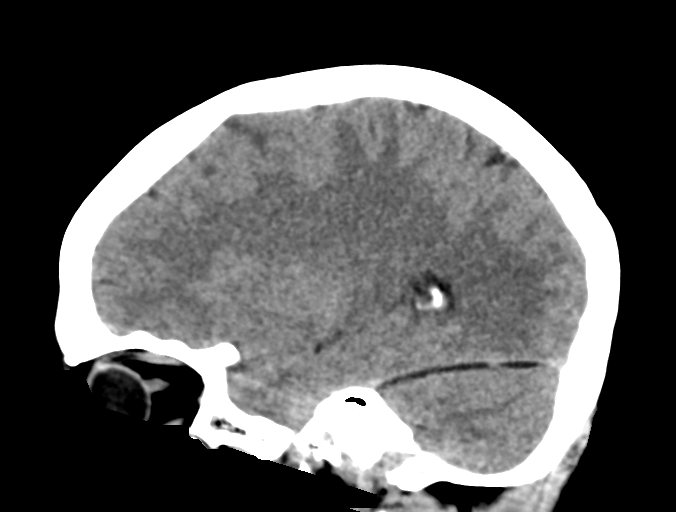
[im 29/58  brain]
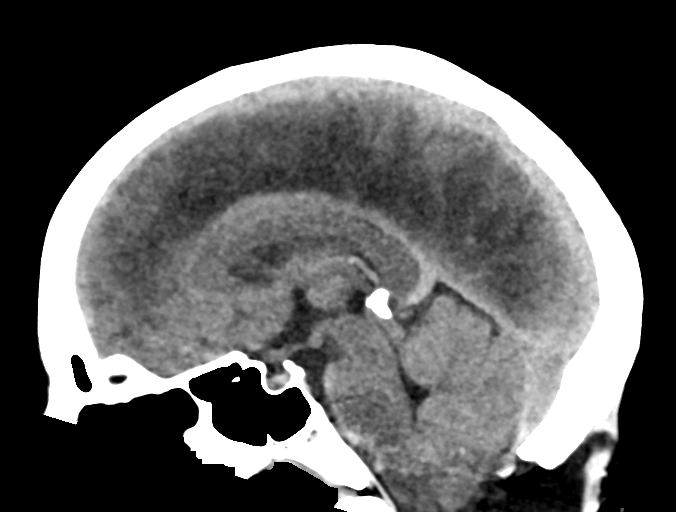
[im 39/58  brain]
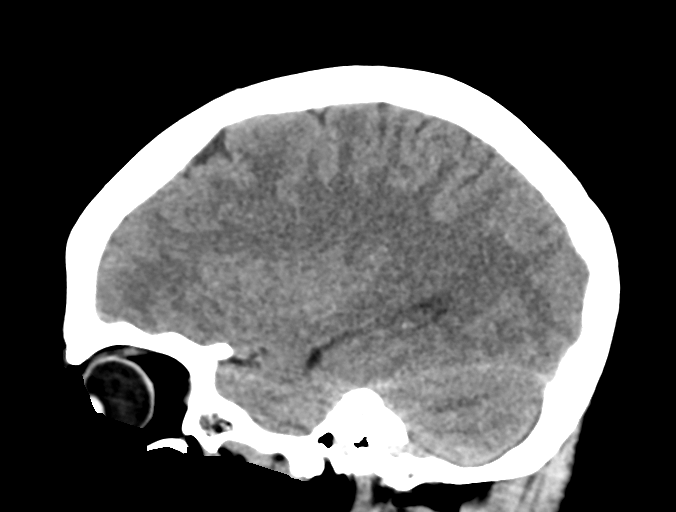

[15 of 47 positions shown; findings below may reference images not displayed]

FINDINGS: CT HEAD FINDINGS

Brain: Chiari morphology. Status post central decompression of the
posterior fossa. There are low-density collections on the right
along the falx cerebri and right tentorial leaflet. No acute
hemorrhage. No hydrocephalus.

Vascular: No abnormal hyperdensity of the major intracranial
arteries or dural venous sinuses. No intracranial atherosclerosis.

Skull: Suboccipital craniectomy. Midline pseudomeningocele measures
3.4 x 3.5 cm.

Sinuses/Orbits: No fluid levels or advanced mucosal thickening of
the visualized paranasal sinuses. No mastoid or middle ear effusion.
The orbits are normal.

CT CERVICAL SPINE FINDINGS

Alignment: No static subluxation. Facets are aligned. Occipital
condyles are normally positioned.

Skull base and vertebrae: No acute fracture.

Soft tissues and spinal canal: No prevertebral fluid or swelling. No
visible canal hematoma.

Disc levels: No advanced spinal canal or neural foraminal stenosis.

Upper chest: No pneumothorax, pulmonary nodule or pleural effusion.

Other: Normal visualized paraspinal cervical soft tissues.
IMPRESSION: 1. Status post central decompression of the posterior fossa with
low-density collections on the right along the falx cerebri and
right tentorial leaflet, likely subdural hygromas. MRI may be
helpful for better characterization.
2. Midline pseudomeningocele measuring 3.4 x 3.5 cm, unchanged.
3. No acute abnormality of the cervical spine.

## 2023-07-06 ENCOUNTER — Emergency Department (HOSPITAL_COMMUNITY): Payer: Medicaid Other

## 2023-07-06 ENCOUNTER — Emergency Department (HOSPITAL_COMMUNITY)
Admission: EM | Admit: 2023-07-06 | Discharge: 2023-07-06 | Disposition: A | Discharge status: Home / Self Care | Payer: Medicaid Other | Attending: Emergency Medicine | Admitting: Emergency Medicine

## 2023-07-06 ENCOUNTER — Other Ambulatory Visit: Payer: Self-pay

## 2023-07-06 DIAGNOSIS — E876 Hypokalemia: Secondary | ICD-10-CM | POA: Insufficient documentation

## 2023-07-06 DIAGNOSIS — R600 Localized edema: Secondary | ICD-10-CM | POA: Insufficient documentation

## 2023-07-06 DIAGNOSIS — M7989 Other specified soft tissue disorders: Secondary | ICD-10-CM | POA: Diagnosis present

## 2023-07-06 DIAGNOSIS — F17200 Nicotine dependence, unspecified, uncomplicated: Secondary | ICD-10-CM | POA: Insufficient documentation

## 2023-07-06 LAB — BASIC METABOLIC PANEL
Anion gap: 8 (ref 5–15)
BUN: 11 mg/dL (ref 6–20)
CO2: 28 mmol/L (ref 22–32)
Calcium: 9.1 mg/dL (ref 8.9–10.3)
Chloride: 99 mmol/L (ref 98–111)
Creatinine, Ser: 0.64 mg/dL (ref 0.44–1.00)
GFR, Estimated: >60 mL/min (ref >60)
Glucose, Bld: 85 mg/dL (ref 70–99)
Potassium: 3.1 mmol/L — ABNORMAL LOW (ref 3.5–5.1)
Sodium: 135 mmol/L (ref 135–145)

## 2023-07-06 LAB — CBC
HCT: 34.1 % — ABNORMAL LOW (ref 36.0–46.0)
Hemoglobin: 11.5 g/dL — ABNORMAL LOW (ref 12.0–15.0)
MCH: 32.5 pg (ref 26.0–34.0)
MCHC: 33.7 g/dL (ref 30.0–36.0)
MCV: 96.3 fL (ref 80.0–100.0)
Platelets: 262 10*3/uL (ref 150–400)
RBC: 3.54 MIL/uL — ABNORMAL LOW (ref 3.87–5.11)
RDW: 12.1 % (ref 11.5–15.5)
WBC: 9.8 10*3/uL (ref 4.0–10.5)
nRBC: 0 % (ref 0.0–0.2)

## 2023-07-06 LAB — MAGNESIUM: Magnesium: 2 mg/dL (ref 1.7–2.4)

## 2023-07-06 LAB — PREGNANCY, URINE: Preg Test, Ur: NEGATIVE

## 2023-07-06 LAB — BRAIN NATRIURETIC PEPTIDE: B Natriuretic Peptide: 37.7 pg/mL (ref 0.0–100.0)

## 2023-07-06 MED ORDER — FUROSEMIDE 20 MG PO TABS
20.0000 mg | ORAL_TABLET | Freq: Once | ORAL | Status: AC
  Administered 2023-07-06: 20 mg via ORAL
  Filled 2023-07-06: qty 1

## 2023-07-06 MED ORDER — POTASSIUM CHLORIDE CRYS ER 20 MEQ PO TBCR
40.0000 meq | EXTENDED_RELEASE_TABLET | Freq: Once | ORAL | Status: AC
  Administered 2023-07-06: 40 meq via ORAL
  Filled 2023-07-06: qty 2

## 2023-07-06 MED ORDER — FUROSEMIDE 20 MG PO TABS: 20.0000 mg | ORAL_TABLET | Freq: Every day | ORAL | 0 refills | Status: AC

## 2023-07-06 MED ORDER — POTASSIUM CHLORIDE CRYS ER 20 MEQ PO TBCR: 20.0000 meq | EXTENDED_RELEASE_TABLET | Freq: Two times a day (BID) | ORAL | 0 refills | Status: AC

## 2023-07-06 NOTE — ED Provider Notes (Signed)
[place] EMERGENCY DEPARTMENT AT [facility] Provider Note   CSN: [id number] Arrival date & time: 07/06/23  0249     History  Chief Complaint  Patient presents with   Leg Swelling    Brooke Carrillo is a 37 y.o. female.  HPI    37 year old female with history of tobacco use disorder comes in with chief complaint of bilateral leg swelling starting yesterday.  Patient has never had this issue before.  She denies any history of heart disease, kidney problems, liver disease.  She denies any leg pain.  Patient denies any change in diet, new medications and also denies standing for long period of time.  Home Medications Prior to Admission medications   Medication Sig Start Date End Date Taking? Authorizing Provider  furosemide (LASIX) 20 MG tablet Take 1 tablet (20 mg total) by mouth daily. 07/06/23  Yes Reis Goga, MD  potassium chloride SA (KLOR-CON M) 20 MEQ tablet Take 1 tablet (20 mEq total) by mouth 2 (two) times daily. 07/06/23  Yes Derwood Kaplan, MD  acetaminophen (TYLENOL) 325 MG tablet Take 650 mg by mouth every 6 (six) hours as needed for moderate pain or headache.    [provider]  cyclobenzaprine (FLEXERIL) 10 MG tablet Take 1 tablet (10 mg total) by mouth 3 (three) times daily as needed for muscle spasms. Patient not taking: Reported on 06/10/2021 05/02/21   Alvira Monday, MD  oxyCODONE (OXY IR/ROXICODONE) 5 MG immediate release tablet Take 1 tablet (5 mg total) by mouth every 4 (four) hours as needed (pain). 06/16/21   Lisbeth Renshaw, MD  prochlorperazine (COMPAZINE) 10 MG tablet Take 1 tablet (10 mg total) by mouth 2 (two) times daily as needed for nausea or vomiting (headache). Patient not taking: Reported on 06/10/2021 05/02/21   Alvira Monday, MD      Allergies    Patient has no known allergies.    Review of Systems   Review of Systems  All other systems reviewed and are negative.   Physical Exam Updated Vital Signs BP 124/74    Pulse 71   Temp 98 F (36.7 C)   Resp 18   Ht 5\' 3"  (1.6 m)   Wt 58.1 kg   LMP 07/06/2023   SpO2 99%   BMI 22.67 kg/m  Physical Exam Vitals and nursing note reviewed.  Constitutional:      Appearance: She is well-developed.  HENT:     Head: Atraumatic.  Eyes:     Extraocular Movements: Extraocular movements intact.  Cardiovascular:     Rate and Rhythm: Normal rate.  Pulmonary:     Effort: Pulmonary effort is normal.  Musculoskeletal:     Right lower leg: Edema present.     Left lower leg: Edema present.     Comments: Bilateral lower extremity swelling, with 1+ pitting edema.  No unilateral swelling noted, no tenderness to palpation  Skin:    General: Skin is dry.  Neurological:     Mental Status: She is alert and oriented to person, place, and time.     ED Results / Procedures / Treatments   Labs (all labs ordered are listed, but only abnormal results are displayed) Labs Reviewed  BASIC METABOLIC PANEL - Abnormal; Notable for the following components:      Result Value   Potassium 3.1 (*)    All other components within normal limits  CBC - Abnormal; Notable for the following components:   RBC 3.54 (*)    Hemoglobin  11.5 (*)    HCT 34.1 (*)    All other components within normal limits  MAGNESIUM  BRAIN NATRIURETIC PEPTIDE  PREGNANCY, URINE    EKG None  Radiology DG Chest 1 View  Result Date: 07/06/2023 CLINICAL DATA:  Leg swelling EXAM: CHEST  1 VIEW COMPARISON:  06/09/2021 FINDINGS: Normal heart size and mediastinal contours. No acute infiltrate or edema. No effusion or pneumothorax. No acute osseous findings. A shunt catheter traverses the right chest, stable. IMPRESSION: No active disease. Electronically Signed   By: Tiburcio Pea M.D.   On: 07/06/2023 05:22    Procedures Procedures    Medications Ordered in ED Medications  furosemide (LASIX) tablet 20 mg (20 mg Oral Given 07/06/23 0930)  potassium chloride SA (KLOR-CON M) CR tablet 40 mEq (40 mEq  Oral Given 07/06/23 0931)    ED Course/ Medical Decision Making/ A&P                                 Medical Decision Making Amount and/or Complexity of Data Reviewed Labs: ordered.  Risk Prescription drug management.   Patient comes in with chief complaint of bilateral lower extremity swelling.  She has history of Chiari malformation, tobacco use disorder.  No history of any cardiac, renal or liver disease.  She denies any heavy alcohol use.  Exam is overall reassuring.  Patient has 1+ pitting edema.  Differential considered for this patient includes renal failure, low albumin state, CHF, venous insufficiency, lymphedema, nephrotic syndrome.  Patient noted to have low potassium.  She is noted to have normal BNP, normal renal function.  LFTs from about 2 years back were normal.  Patient denies any alcohol use disorder.  I do not think need to add LFTs.  We do not have UA, but we will defer UA for ruling out nephrotic syndrome to outpatient team.  Patient advised to start taking Lasix for 5 days.  Given her hypokalemia, potassium will also be prescribed.  Compression stocking utilization during the daytime encouraged.  Elevation of legs at nighttime encouraged.  Final Clinical Impression(s) / ED Diagnoses Final diagnoses:  Bilateral lower extremity edema  Hypokalemia    Rx / DC Orders ED Discharge Orders          Ordered    furosemide (LASIX) 20 MG tablet  Daily        07/06/23 0926    potassium chloride SA (KLOR-CON M) 20 MEQ tablet  2 times daily        07/06/23 0926              Derwood Kaplan, MD 07/06/23 [phone]

## 2023-07-06 NOTE — ED Triage Notes (Signed)
Pt POV d/t Bilat leg swelling for 24 hours - hx of swelling in hands but not feet/legs.

## 2023-07-06 NOTE — Discharge Instructions (Addendum)
You are seen in the emergency room for leg swelling. The workup in the emergency room ruled out heart failure, renal failure.  It does not appear that you have any history of liver disease.  It is not clear why you have leg swelling.  You could have conditions such as chronic venous insufficiency or lymphedema.  For now, we recommend that you reduce salt in your diet, take Lasix that are prescribed for the next 5 days and follow-up with your PCP.  We also recommend that you wear compression stockings during the daytime and keep your legs elevated at nighttime.

## 2023-12-01 ENCOUNTER — Other Ambulatory Visit: Payer: Self-pay | Admitting: Physician Assistant

## 2023-12-01 DIAGNOSIS — N6310 Unspecified lump in the right breast, unspecified quadrant: Secondary | ICD-10-CM

## 2023-12-02 ENCOUNTER — Ambulatory Visit
Admission: RE | Admit: 2023-12-02 | Discharge: 2023-12-02 | Disposition: A | Discharge status: Home / Self Care | Source: Ambulatory Visit | Attending: Physician Assistant | Admitting: Physician Assistant

## 2023-12-02 DIAGNOSIS — N6310 Unspecified lump in the right breast, unspecified quadrant: Secondary | ICD-10-CM

## 2024-09-03 ENCOUNTER — Other Ambulatory Visit: Payer: Self-pay | Admitting: Medical Genetics

## 2024-10-20 ENCOUNTER — Other Ambulatory Visit
